# Patient Record
Sex: Male | Born: 1950
Health system: Southern US, Community
[De-identification: ages and names within clinical notes are randomized; demographics above are authoritative.]

## PROBLEM LIST (undated history)

## (undated) DIAGNOSIS — Z9889 Other specified postprocedural states: Secondary | ICD-10-CM

## (undated) DIAGNOSIS — R399 Unspecified symptoms and signs involving the genitourinary system: Secondary | ICD-10-CM

## (undated) DIAGNOSIS — N529 Male erectile dysfunction, unspecified: Secondary | ICD-10-CM

## (undated) DIAGNOSIS — K449 Diaphragmatic hernia without obstruction or gangrene: Secondary | ICD-10-CM

## (undated) DIAGNOSIS — Z8249 Family history of ischemic heart disease and other diseases of the circulatory system: Secondary | ICD-10-CM

## (undated) DIAGNOSIS — Z8719 Personal history of other diseases of the digestive system: Secondary | ICD-10-CM

## (undated) DIAGNOSIS — J4520 Mild intermittent asthma, uncomplicated: Secondary | ICD-10-CM

## (undated) DIAGNOSIS — C4492 Squamous cell carcinoma of skin, unspecified: Secondary | ICD-10-CM

## (undated) DIAGNOSIS — M1990 Unspecified osteoarthritis, unspecified site: Secondary | ICD-10-CM

## (undated) DIAGNOSIS — R339 Retention of urine, unspecified: Secondary | ICD-10-CM

## (undated) DIAGNOSIS — N486 Induration penis plastica: Secondary | ICD-10-CM

## (undated) DIAGNOSIS — Z85828 Personal history of other malignant neoplasm of skin: Secondary | ICD-10-CM

## (undated) DIAGNOSIS — I10 Essential (primary) hypertension: Secondary | ICD-10-CM

## (undated) DIAGNOSIS — A809 Acute poliomyelitis, unspecified: Secondary | ICD-10-CM

## (undated) DIAGNOSIS — L570 Actinic keratosis: Secondary | ICD-10-CM

## (undated) DIAGNOSIS — K219 Gastro-esophageal reflux disease without esophagitis: Secondary | ICD-10-CM

## (undated) DIAGNOSIS — N400 Enlarged prostate without lower urinary tract symptoms: Secondary | ICD-10-CM

## (undated) HISTORY — DX: Squamous cell carcinoma of skin, unspecified: C44.92

## (undated) HISTORY — PX: BAND HEMORRHOIDECTOMY: SHX1213

## (undated) HISTORY — DX: Actinic keratosis: L57.0

## (undated) HISTORY — PX: OTHER SURGICAL HISTORY: SHX169

## (undated) HISTORY — DX: Other specified postprocedural states: Z98.890

## (undated) HISTORY — DX: Diaphragmatic hernia without obstruction or gangrene: K44.9

## (undated) HISTORY — DX: Unspecified osteoarthritis, unspecified site: M19.90

## (undated) HISTORY — DX: Acute poliomyelitis, unspecified: A80.9

---
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
## 1995-04-14 HISTORY — PX: ANKLE FUSION: SHX881

## 1998-03-05 ENCOUNTER — Ambulatory Visit (HOSPITAL_COMMUNITY): Admission: RE | Admit: 1998-03-05 | Discharge: 1998-03-05 | Payer: Self-pay | Admitting: Gastroenterology

## 1998-11-05 ENCOUNTER — Ambulatory Visit (HOSPITAL_COMMUNITY): Admission: RE | Admit: 1998-11-05 | Discharge: 1998-11-05 | Payer: Self-pay | Admitting: Gastroenterology

## 1999-04-02 ENCOUNTER — Ambulatory Visit (HOSPITAL_COMMUNITY): Admission: RE | Admit: 1999-04-02 | Discharge: 1999-04-02 | Payer: Self-pay | Admitting: Gastroenterology

## 1999-04-03 ENCOUNTER — Ambulatory Visit (HOSPITAL_BASED_OUTPATIENT_CLINIC_OR_DEPARTMENT_OTHER): Admission: RE | Admit: 1999-04-03 | Discharge: 1999-04-03 | Payer: Self-pay | Admitting: Orthopedic Surgery

## 1999-07-24 ENCOUNTER — Ambulatory Visit (HOSPITAL_COMMUNITY): Admission: RE | Admit: 1999-07-24 | Discharge: 1999-07-24 | Payer: Self-pay | Admitting: Gastroenterology

## 1999-08-08 ENCOUNTER — Ambulatory Visit (HOSPITAL_COMMUNITY): Admission: RE | Admit: 1999-08-08 | Discharge: 1999-08-08 | Payer: Self-pay | Admitting: Gastroenterology

## 2000-02-24 ENCOUNTER — Ambulatory Visit (HOSPITAL_COMMUNITY): Admission: RE | Admit: 2000-02-24 | Discharge: 2000-02-24 | Payer: Self-pay | Admitting: Gastroenterology

## 2000-07-07 HISTORY — PX: KNEE ARTHROSCOPY: SUR90

## 2000-07-09 ENCOUNTER — Inpatient Hospital Stay (HOSPITAL_COMMUNITY): Admission: EM | Admit: 2000-07-09 | Discharge: 2000-07-11 | Payer: Self-pay | Admitting: Emergency Medicine

## 2000-07-14 ENCOUNTER — Encounter: Admission: RE | Admit: 2000-07-14 | Discharge: 2000-07-14 | Payer: Self-pay | Admitting: Internal Medicine

## 2000-09-07 ENCOUNTER — Ambulatory Visit (HOSPITAL_COMMUNITY): Admission: RE | Admit: 2000-09-07 | Discharge: 2000-09-07 | Payer: Self-pay | Admitting: Gastroenterology

## 2001-03-08 ENCOUNTER — Encounter (INDEPENDENT_AMBULATORY_CARE_PROVIDER_SITE_OTHER): Payer: Self-pay

## 2001-03-08 ENCOUNTER — Ambulatory Visit (HOSPITAL_COMMUNITY): Admission: RE | Admit: 2001-03-08 | Discharge: 2001-03-08 | Payer: Self-pay | Admitting: Gastroenterology

## 2001-03-30 ENCOUNTER — Ambulatory Visit (HOSPITAL_BASED_OUTPATIENT_CLINIC_OR_DEPARTMENT_OTHER): Admission: RE | Admit: 2001-03-30 | Discharge: 2001-03-30 | Payer: Self-pay | Admitting: Orthopedic Surgery

## 2001-03-30 HISTORY — PX: WRIST ARTHROSCOPY WITH DEBRIDEMENT: SHX6194

## 2001-08-11 ENCOUNTER — Ambulatory Visit (HOSPITAL_COMMUNITY): Admission: RE | Admit: 2001-08-11 | Discharge: 2001-08-11 | Payer: Self-pay | Admitting: Gastroenterology

## 2004-04-13 HISTORY — PX: CARDIOVASCULAR STRESS TEST: SHX262

## 2004-06-30 ENCOUNTER — Ambulatory Visit: Payer: Self-pay | Admitting: Cardiology

## 2004-07-10 ENCOUNTER — Ambulatory Visit: Payer: Self-pay | Admitting: Cardiology

## 2004-07-10 ENCOUNTER — Ambulatory Visit: Payer: Self-pay

## 2006-02-22 ENCOUNTER — Inpatient Hospital Stay (HOSPITAL_COMMUNITY): Admission: RE | Admit: 2006-02-22 | Discharge: 2006-02-26 | Payer: Self-pay | Admitting: Orthopedic Surgery

## 2006-02-22 HISTORY — PX: TOTAL KNEE ARTHROPLASTY: SHX125

## 2006-05-31 ENCOUNTER — Ambulatory Visit (HOSPITAL_COMMUNITY): Admission: RE | Admit: 2006-05-31 | Discharge: 2006-05-31 | Payer: Self-pay | Admitting: Orthopedic Surgery

## 2006-06-22 ENCOUNTER — Ambulatory Visit: Payer: Self-pay | Admitting: Family Medicine

## 2006-07-15 ENCOUNTER — Ambulatory Visit: Payer: Self-pay | Admitting: Family Medicine

## 2006-11-01 ENCOUNTER — Ambulatory Visit (HOSPITAL_COMMUNITY): Admission: RE | Admit: 2006-11-01 | Discharge: 2006-11-02 | Payer: Self-pay | Admitting: Orthopedic Surgery

## 2006-11-01 HISTORY — PX: OTHER SURGICAL HISTORY: SHX169

## 2007-02-08 ENCOUNTER — Ambulatory Visit: Payer: Self-pay | Admitting: Internal Medicine

## 2007-02-08 ENCOUNTER — Inpatient Hospital Stay (HOSPITAL_COMMUNITY): Admission: EM | Admit: 2007-02-08 | Discharge: 2007-02-12 | Payer: Self-pay | Admitting: Emergency Medicine

## 2007-02-09 HISTORY — PX: OTHER SURGICAL HISTORY: SHX169

## 2007-02-15 ENCOUNTER — Ambulatory Visit: Payer: Self-pay | Admitting: Family Medicine

## 2007-03-03 ENCOUNTER — Ambulatory Visit: Payer: Self-pay | Admitting: Family Medicine

## 2007-03-11 ENCOUNTER — Encounter: Admission: RE | Admit: 2007-03-11 | Discharge: 2007-03-11 | Payer: Self-pay | Admitting: Family Medicine

## 2007-03-11 ENCOUNTER — Ambulatory Visit: Payer: Self-pay | Admitting: Family Medicine

## 2007-03-17 ENCOUNTER — Encounter: Admission: RE | Admit: 2007-03-17 | Discharge: 2007-03-17 | Payer: Self-pay | Admitting: Family Medicine

## 2007-06-01 ENCOUNTER — Ambulatory Visit: Payer: Self-pay | Admitting: Family Medicine

## 2007-06-08 ENCOUNTER — Ambulatory Visit: Payer: Self-pay | Admitting: Family Medicine

## 2007-09-06 ENCOUNTER — Ambulatory Visit: Payer: Self-pay | Admitting: Family Medicine

## 2008-01-04 ENCOUNTER — Ambulatory Visit (HOSPITAL_BASED_OUTPATIENT_CLINIC_OR_DEPARTMENT_OTHER): Admission: RE | Admit: 2008-01-04 | Discharge: 2008-01-04 | Payer: Self-pay | Admitting: Orthopedic Surgery

## 2008-01-04 HISTORY — PX: NEUROPLASTY / TRANSPOSITION ULNAR NERVE AT ELBOW: SUR895

## 2008-01-05 ENCOUNTER — Ambulatory Visit: Payer: Self-pay | Admitting: Family Medicine

## 2008-01-30 LAB — HM COLONOSCOPY

## 2008-02-07 ENCOUNTER — Ambulatory Visit: Payer: Self-pay | Admitting: Family Medicine

## 2008-02-09 ENCOUNTER — Ambulatory Visit: Payer: Self-pay | Admitting: Family Medicine

## 2008-02-23 ENCOUNTER — Ambulatory Visit: Payer: Self-pay | Admitting: Family Medicine

## 2008-04-03 ENCOUNTER — Ambulatory Visit: Payer: Self-pay | Admitting: Family Medicine

## 2008-06-05 ENCOUNTER — Ambulatory Visit: Payer: Self-pay | Admitting: Family Medicine

## 2008-06-27 ENCOUNTER — Ambulatory Visit: Payer: Self-pay | Admitting: Family Medicine

## 2009-01-28 ENCOUNTER — Ambulatory Visit: Payer: Self-pay | Admitting: Family Medicine

## 2009-03-28 ENCOUNTER — Ambulatory Visit: Payer: Self-pay | Admitting: Family Medicine

## 2009-06-24 ENCOUNTER — Ambulatory Visit: Payer: Self-pay | Admitting: Physician Assistant

## 2009-11-04 ENCOUNTER — Ambulatory Visit: Payer: Self-pay | Admitting: Physician Assistant

## 2010-07-03 ENCOUNTER — Encounter: Payer: Self-pay | Admitting: Family Medicine

## 2010-08-13 ENCOUNTER — Encounter (INDEPENDENT_AMBULATORY_CARE_PROVIDER_SITE_OTHER): Payer: BC Managed Care – PPO | Admitting: Family Medicine

## 2010-08-13 DIAGNOSIS — E871 Hypo-osmolality and hyponatremia: Secondary | ICD-10-CM

## 2010-08-13 DIAGNOSIS — Z0000 Encounter for general adult medical examination without abnormal findings: Secondary | ICD-10-CM

## 2010-08-13 DIAGNOSIS — Z8249 Family history of ischemic heart disease and other diseases of the circulatory system: Secondary | ICD-10-CM

## 2010-08-26 NOTE — Op Note (Signed)
NAME:  AULTON, ROUTT             ACCOUNT NO.:  [id number]   MEDICAL RECORD NO.:  [id number]          PATIENT TYPE:  OIB   LOCATION:  1618                         FACILITY:  [facility]   PHYSICIAN:  Ollen Gross, M.D.    DATE OF BIRTH:  1950/06/08   DATE OF PROCEDURE:  11/01/2006  DATE OF DISCHARGE:                               OPERATIVE REPORT   PREOPERATIVE DIAGNOSIS:  Right periprosthetic patella fracture.   POSTOPERATIVE DIAGNOSIS:  Right periprosthetic patella fracture.   PROCEDURE:  Open reduction internal fixation of right periprosthetic  patella fracture.   SURGEON:  Ollen Gross, M.D.   ASSISTANT:  Alexzandrew L. Perkins, P.A.C.   ANESTHESIA:  General.   BLOOD LOSS:  Minimal.   DRAINS:  None.   COMPLICATIONS:  None.   TOURNIQUET TIME:  42 minutes at 300 mmHg.   CONDITION:  Stable to recovery room.   CLINICAL NOTE:  Wayne Brandt is a 60 year old male who had an uncomplicated  right total knee done last year.  He was doing well and then injured  himself playing tennis.  He felt a pop.  He had a slight separation at  the extensor mechanism; but then, over 2-week time span this worsened.  He presents now for open reduction internal fixation.   PROCEDURE IN DETAIL:  After the successful administration of general  anesthetic, a tourniquet was placed high on the right thigh and the  right lower extremity prepped and draped in the usual sterile fashion.  The extremity was wrapped in Esmarch and the tourniquet inflated to 300  mmHg.  A midline incision was made with a 10 blade through subcutaneous  tissue.  Upon getting through the subcutaneous tissue we encountered  some bloody fluid.  The joint was thoroughly irrigated with saline.  There was a tiny inferior pole fragment and, I would say, 7/8 of the  patella was attached to the superior and 1/8 inferior.  The patellar  component was firmly attached to the superior fragment.  I passed 2  FiberWire sutures through the patella  tendon and the inferior fragment,  with the nail-type stitch; then drilled 3 holes in the patella and  passed the sutures from inferior to superior.  We reduced the patella  with reduction clamp and then tied these sutures together.  This led to  a pretty stable reduction.  I then passed 2 K-wires from inferior to  superior, adding to the reduction; then a Cerclage wire 16-gauge was  passed around, and this effectively reduced the fragments down the rest  of the way.  I let the knee flex against gravity, and at 90 degrees it  was not separating at all at the repair site.  We then copiously  irrigated the wound.  I oversewed some of the soft tissue with  interrupted #1 Ethibond to further enhance the repair.  The tourniquet  was then released, with a total time of 42 minutes.  Minor bleeding was  stopped with cautery.  The subcutaneous tissues were closed with  interrupted 2-  0 Vicryl, and the skin with subcuticular running 4-0 Monocryl.  The  incisions  were cleaned and dried.  Steri-Strips and a bulky sterile  dressing applied.  He was placed into a knee immobilizer, awakened and  transported to recovery in stable condition.      Ollen Gross, M.D.  Electronically Signed     FA/MEDQ  D:  11/01/2006  T:  11/02/2006  Job:  [id number]

## 2010-08-26 NOTE — Op Note (Signed)
NAME:  [name], [name]             ACCOUNT NO.:  [id number]   MEDICAL RECORD NO.:  [id number]          PATIENT TYPE:  INP   LOCATION:  1224                         FACILITY:  [facility]   PHYSICIAN:  Ollen Gross, M.D.    DATE OF BIRTH:  03/11/1951   DATE OF PROCEDURE:  02/09/2007  DATE OF DISCHARGE:                               OPERATIVE REPORT   PREOPERATIVE DIAGNOSIS:  Right knee infection.   POSTOPERATIVE DIAGNOSIS:  Right knee infection.   PROCEDURE:  Irrigation and debridement of the right knee with removal of  patellar wires.   SURGEON:  Ollen Gross, M.D.   ASSISTANT:  Alexzandrew L. Perkins, P.A.-C.   ANESTHESIA:  General.   ESTIMATED BLOOD LOSS:  Minimal.   DRAIN:  Hemovac x1.   TOURNIQUET TIME:  24 minutes at 300 mmHg.   COMPLICATIONS:  None.   BRIEF CLINICAL NOTE:  Wayne Brandt is a 59 year old male who had a right total  knee arthroplasty done without difficulty a little over a year ago.  He  sustained a patella fracture in July of this year.  This was treated  with open reduction and internal fixation.  He recovered uneventfully,  then was exercising this weekend and developed pain and had a small  pinpoint area of drainage in the knee.  The next evening he developed  fever and chills.  He was admitted to the hospital by Dr. Shon Baton for  fear of sepsis.  He responded well to IV vancomycin.  He has had  drainage from this pinpoint area.  He presents now for irrigation and  debridement and removal of patellar hardware.   PROCEDURE IN DETAIL:  After the successful administration of a general  anesthetic, a tourniquet is placed on the right thigh and the right  lower extremity prepped and draped in the usual sterile fashion.  The  extremity was wrapped in an Esmarch, knee flexed, tourniquet inflated to  300 mmHg.  A midline incision was made with a 10 blade through a very  thickened subcutaneous tissue.  I was able to see the small sinus area  that had communicated with  the skin.  There was the end of a wire  directly in this sinus; thus, he was getting irritation from the wire.  I removed the two K-wires, then cut the cerclage wire and removed that  also.  This was all of the patellar hardware.  It appeared this may have  communicated with the joint so I did an arthrotomy.  There was some  fluid in the joint but the tissues, also thickened, did not show what  appeared to be any acute inflammation.  I did a thorough scar excision  and we irrigated the joint with 3 L of saline using pulsatile lavage.  I  did not see any fibrinous debris in the joint, and the joint overall did  not have an infected appearance.  After completion of the lavage, then  we placed a Hemovac drain and closed the arthrotomy with interrupted #1  PDS.  I excised the sinus tract and reapproximated the tissue  that had a healthy appearance.  The tourniquet was then released, total  time of 24 minutes.  Subcu was closed with interrupted 2-0 Vicryl and  skin with staples.  The incision was cleaned and dried and bulky sterile  dressing applied.  He is then awakened and transferred to recovery in  stable condition.      Ollen Gross, M.D.  Electronically Signed     FA/MEDQ  D:  02/09/2007  T:  02/10/2007  Job:  [id number]

## 2010-08-26 NOTE — Op Note (Signed)
NAME:  Wayne Brandt, Wayne Brandt             ACCOUNT NO.:  [id number]   MEDICAL RECORD NO.:  [id number]          PATIENT TYPE:  AMB   LOCATION:  DSC                          FACILITY:  [facility]   PHYSICIAN:  Artist Pais. Weingold, M.D.DATE OF BIRTH:  May 23, 1950   DATE OF PROCEDURE:  01/04/2008  DATE OF DISCHARGE:                               OPERATIVE REPORT   PREOPERATIVE DIAGNOSIS:  Chronic ulnar neuropathy, right elbow with  unstable ulnar nerve.   POSTOPERATIVE DIAGNOSIS:  Chronic ulnar neuropathy, right elbow with  unstable ulnar nerve.   PROCEDURE:  Ulnar nerve decompression and submuscular transposition,  right side.   SURGEONS:  1. Artist Pais Mina Marble, MD  2. Cindee Salt, MD   ANESTHESIA:  Axillary block.   TOURNIQUET TIME:  1 hour.   COMPLICATIONS:  None.   DRAINS:  None of report.   PROCEDURE IN DETAIL:  The patient was taken to the operating suite after  induction of adequate general anesthesia.  Right upper extremity was  prepped and draped in usual sterile fashion.  An Esmarch was used to  exsanguinate the limb and tourniquet was then inflated to 215 mmHg.  At  this point in time, using his previous incision on the medial aspect of  the right elbow, the skin was incised and flaps were raised accordingly.  The ulnar nerve was carefully identified in the proximal cubital tunnel.  There was significant scarring throughout the length of the cubital  tunnel.  This was decompressed proximally and distally to the level of  the fascia overlying the flexor carpi ulnaris muscle which was carefully  decompressed proximally to the area of the arcade of Struthers which was  also decompressed.  The inner muscular septum was resected.  At this  point in time, the flap anteriorly was dissected to expose the median  nerve and the flexor pronator mass.  Once this was done, the flexor  pronator mass was carefully dissected off the medial condyle leaving a  small cuff of tissue.  This was  followed by advancement of the flexor  pronator mass until the ulnar nerve could be transposed in line and  parallel with the median nerve.  Once this was done, the flexor pronator  mass was then repaired back down to the medial epicondyle using a 2-0  FiberWire x2.  Whole of the mattress sutures drilled through bone  tunnels and tied posteriorly.  The remaining aspects of flexor pronator  mass were repaired with 0 Vicryl followed by 2-0 undyed Vicryl  subcutaneously and a 3-0 Prolene  subcuticular stitch on the skin.  Steri-Strips, 4 x 4s, fluffs, and a  splint with the wrist flexed to 90 degrees, and elbow flexed to 90  degrees was applied.  The patient tolerated the procedure well and went  to recovery room in stable fashion.      Artist Pais Mina Marble, M.D.  Electronically Signed     MAW/MEDQ  D:  01/04/2008  T:  01/05/2008  Job:  [id number]

## 2010-08-26 NOTE — H&P (Signed)
NAME:  Wayne Brandt, Wayne Brandt             ACCOUNT NO.:  [id number]   MEDICAL RECORD NO.:  [id number]          PATIENT TYPE:  INP   LOCATION:  1224                         FACILITY:  [facility]   PHYSICIAN:  Alvy Beal, MD    DATE OF BIRTH:  Jun 05, 1950   DATE OF ADMISSION:  02/07/2007  DATE OF DISCHARGE:                              HISTORY & PHYSICAL   His regular caring physician is Dr. Ollen Gross.   HISTORY:  Wayne Brandt is a very pleasant active 60 year old gentleman who about  15 months ago underwent a total right knee replacement.  This was  complicated postoperatively by a patella fracture which he underwent an  open reduction internal fixation in July of 2008.  The patient was doing  quite well, he was very active, exercising on a regular basis and he  stated on Saturday, approximately 3 days ago, he started noting a little  clear drainage from the inferior aspect of his surgical wound.  He  states that he did not have any fevers but he was noting stiffness and  difficulty moving the knee.  It is progressed, and then today he noticed  some fevers and his wife took his temperature and he was at 102.  He  called me by phone and I recommended he come to the ER for further  evaluation.  Upon presentation, he was noted to be hypotensive at 80/44  and tachycardic to 120.  This was sitting in the left arm.   As a result, I was notified.  An attempt was made through a medial  inferior pin site for knee aspiration but this was unsuccessful.  A  local swab was taken from the draining wound site at the inferior aspect  of the anterior knee incision.  The patient was started on IV fluids and  had an excellent response to them.   At present, the patient is in bed, comfortable, he has significant pain  only when he attempts to straight leg raise or bend the knee.  Otherwise, he has no other significant complaints.  He is currently  afebrile and his blood pressure has responded, his last blood  pressure  was 109/59 with a positive response to IV fluids.  After appropriate  enteric blood cultures and aspiration was attempted he was started on IV  vancomycin.   His past medical, surgical, family, social history includes:  1. He has coronary artery disease.  2. He has no other significant medical problems.  3. He is a non-drug abuser.  4. Non-smoker.  5. Occasional drinker.  6. He is otherwise healthy.  7. He has had a previous wrist scope, elbow surgery and leg surgery.   CLINICAL EXAM:  He is a pleasant gentleman who appears his stated age,  in no acute distress.  He is alert and oriented x3.  He has no shortness  of breath or chest pain, no abnormal breath sounds.  He is clear to  auscultation.  Abdomen is soft and nontender.  He has no history of  nausea or vomiting or significant weight loss.  He has no upper  extremity  pain with joint range of motion at the shoulder, elbow, wrist,  no nuchal rigidity.   He has got no pain with internal and external rotation of the lower  extremity at the hip.  His left lower extremity is asymptomatic with no  knee pain, swelling or tenderness, no ankle pain, swelling or  tenderness, intact peripheral pulses.  On the right lower extremity he  has got erythema on the lateral aspect of the knee, he has got a small  pin size drainage site at the inferior aspect of the anterior knee  incision that is draining purulent type material but it is not foul in  odor.  He has got some swelling and tenderness.  He has got significant  pain with attempts at range of motion but no significant pain with  direct palpation.  He is unable to maintain a straight leg position or  because of severe pain and he cannot bend the knee because of severe  pain.   This significantly limits the ability to test his range of motion.   His distal neurovascular exam is intact with no localizing motor or  sensory deficits in the tibialis, anterior, EHL or  gastrocnemius, 2+  symmetrical deep tendon reflexes, sensation to light touch is intact.   X-rays from today were evaluated.  Unfortunately, he have no previous x-  rays to compare but he does appear to have what may be an acute inferior  pole fracture.  The hardware from the ORIF done in July appears to be  stable.  There is no breakage of either the pins or tension band.   At this point in time, I believe the patient has a superficial  cellulitis as well as some prepatellar bursitis.  I did attempt, under  sterile conditions, to aspirate from the knee joint itself.  With an 18-  gauge needle I took a superomedial starting position which was free from  any local erythema.  I was in the joint, I was unable to aspirate  anything but a small amount of blood.  There was no frank pus.  At this  point in time, because of the hypotension and septic appearance, I have  contacted the ICU and will have admitted for ICU observation.  The  critical care team will also be involved in his care.  Will continue his  IV vancomycin, will place a Foley and his labs are still pending.  We  have drawn a CBC with diff and a full BMP panel, and PT, PTT and INR.  Will continue to observe him.  I will notify Dr. Lequita Halt in the morning  and will also get a PICC line and ID consult, if Dr. Lequita Halt feels this  is necessary, in the morning.      Alvy Beal, MD  Electronically Signed     DDB/MEDQ  D:  02/08/2007  T:  02/08/2007  Job:  [id number]   cc:   Ollen Gross, M.D.  Fax: [phone]

## 2010-08-29 NOTE — H&P (Signed)
NAME:  Wayne Brandt, Wayne Brandt             ACCOUNT NO.:  [id number]   MEDICAL RECORD NO.:  [id number]          PATIENT TYPE:  INP   LOCATION:  NA                           [facility]ACILITY:  [facility]   PHYSICIAN:  Ollen Gross, M.D.    DATE OF BIRTH:  [birth year]   DATE OF ADMISSION:  02/22/2006  DATE OF DISCHARGE:                                HISTORY & PHYSICAL   DATE OF OFFICE VISIT AND HISTORY & PHYSICAL:  February 04, 2006.   CHIEF COMPLAINT:  Right knee pain.   HISTORY OF PRESENT ILLNESS:  This is a 60 year old male who has been  complaining of pain in the right knee for quite some time now.  He was seen  by Dr. Lequita Halt in second opinion.  He has a significant history of having  undergone a knee arthroscopy in the past but unfortunately developed  postoperative infection and required a second washout scope.  He did not  require any long-term antibiotics, just postoperatively.  The leg has been  progressive in nature.  He was seen back in December 2006 by Dr. Lequita Halt and  found at that time to have severe end-stage arthritis of the right knee with  massive osteophyte formation.  It has been progressive in the past year and  continues to cause problems and dysfunction.  The patient is extremely  concerned because of the infection he had post arthroscopy several years  ago.  Risks and benefit of this procedure have been discussed with the  patient.  He elects to proceed with surgery.   ALLERGIES:  No known drug allergies.   CURRENT MEDICATIONS:  Aleve stopped preoperatively, vitamin A, vitamin E,  vitamin C, CO-Q-10, acidophilus, and also a cholesterol medication.   PAST MEDICAL HISTORY:  1. History of polio, left leg.  2. Seasonal allergies.  3. Asthma.  4. Esophageal dilatation.   PAST SURGICAL HISTORY:  1. Right shoulder surgery.  2. Left ankle fusion.  3. He did have 2 left foot surgeries during his childhood years, a bunion      on the right foot.  4. Right elbow surgery.  5. Right  wrist surgery.  6. Previous right knee arthroscopy with postoperative infection and then a      second knee arthroscopy washout.   FAMILY HISTORY:  A brother with a history of MI and CABG.  Father with  history of heart disease, deceased at age 23 secondary to MI.   SOCIAL HISTORY:  Married.  Denies tobacco.  Social intake of alcohol.  One  child.  He works as a Print production planner for [employer].   REVIEW OF SYSTEMS:  GENERAL:  No fevers, chills, night sweats.  NEUROLOGIC:  No seizure, syncope, paralysis. RESPIRATORY: No shortness of breath,  productive cough, hemoptysis. CARDIOVASCULAR: No chest pain, angina,  orthopnea. GI: No nausea, vomiting, diarrhea, constipation. GU: No dysuria,  hematuria, discharge.  MUSCULOSKELETAL: Right knee.   PHYSICAL EXAMINATION:  VITAL SIGNS:  Pulse 60, respirations 12, blood  pressure 142/82.  GENERAL: A 60 year old white male, thin frame, alert, oriented, cooperative.  Good historian.  HEENT:  Normocephalic and atraumatic.  Pupils  round and reactive.  Oropharynx clear.  EOM intact.  NECK: Supple.  CHEST: Clear.  HEART: Regular rate and rhythm. No murmur.  S1, S2.  ABDOMEN: Soft, nontender.  Bowel sounds present.  RECTAL/BREASTS/GENITALIA: Not done, not pertinent to present illness.  EXTREMITIES:  Right knee shows range of motion of 10-105, marked crepitus,  no instability.  Some atrophy of the leg is noted.   IMPRESSION:  1. Osteoarthritis, right knee.  2. History of polio, left leg.  3. Seasonal allergies.  4. Asthma.  5. Esophageal strictures post dilatation.   PLAN:  1. The patient will be admitted to [facility] to undergo a right      total knee arthroplasty.  Surgery will      be performed by Dr. Ollen Gross. His medical physician is Dr.      Susann Givens. Dr. Susann Givens will be notified of the room number and will be      consulted if needed for medical assistance with the patient throughout      the hospital course.      Alexzandrew L.  Julien Girt, P.A.      Ollen Gross, M.D.  Electronically Signed    ALP/MEDQ  D:  02/21/2006  T:  02/21/2006  Job:  [id number]   cc:   Sharlot Gowda, M.D.  Fax: [phone]   Griffith Citron, M.D.  Fax: [phone]   Rollene Rotunda, MD, [facility]  [address]  [address]  [place]  [place] [address]

## 2010-08-29 NOTE — Op Note (Signed)
NAME:  Wayne Brandt, Wayne Brandt             ACCOUNT NO.:  [id number]   MEDICAL RECORD NO.:  [id number]          PATIENT TYPE:  INP   LOCATION:  0008                         FACILITY:  [facility]   PHYSICIAN:  Ollen Gross, M.D.    DATE OF BIRTH:  July 15, 1950   DATE OF PROCEDURE:  02/22/2006  DATE OF DISCHARGE:                                 OPERATIVE REPORT   PREOPERATIVE DIAGNOSIS:  Osteoarthritis, right knee.   POSTOPERATIVE DIAGNOSIS:  Osteoarthritis, right knee.   PROCEDURE:  Right total knee arthroplasty.   SURGEON:  Aluisio.   ASSISTANT:  Avel Peace.   ANESTHESIA:  General with postoperative Marcaine pain pump.   ESTIMATED BLOOD LOSS:  Minimal.   DRAINS:  Hemovac x1.   TOURNIQUET TIME:  48 minutes at 300 mmHg.   COMPLICATIONS:  None.   CONDITION:  Stable to recovery.   BRIEF CLINICAL NOTE:  Wayne Brandt is a 60 year old male with severe end-stage  tricompartmental arthritis with large varus deformity.  He has failed  nonoperative management including injection and presents for total knee  arthroplasty.   PROCEDURE IN DETAIL:  After successful initiation of general anesthetic, a  tourniquet was placed on the right thigh and right lower extremity, prepped  and draped in usual sterile fashion.  Extremity was wrapped in Esmarch, knee  flexed, tourniquet inflated to 300 mmHg.  Midline incision was made with a  10 blade through subcutaneous tissue to the level of the extensor mechanism.  Fresh blade was used make a medial parapatellar arthrotomy.  It had massive  osteophytes on the medial femur and medial tibia.  Soft tissue over the  proximal medial tibia was subperiosteally elevated through the joint line  with a knife and into the semimembranosus bursa with a Cobb elevator.  He  had a least 1/2-inch thick osteophyte going all the way around the medial  rim of the tibia, and I removed that.  Patella is then subluxed laterally,  knee flexed 90 degrees.  ACL was already gone.  I had to  remove the  intercondylar osteophytes to get at the PCL.  That was subsequently removed.  A drill was used to create a starting hole in the distal femur.  The canal  was thoroughly irrigated.  A 5-degree right valgus alignment guide was  placed, and referencing off the posterior condyles, rotation was marked and  a block pinned to remove 11 mm off the distal femur.  I took 11 because of  his flexion contracture.  Size 5 was most appropriate femoral component, and  then the size 5 cutting block was placed with the rotation marked off the  epicondylar axis.  The anterior, posterior and chamfer cuts were made.   Tibia subluxed forward, and the meniscal remnants were removed.  The  extramedullary tibial alignment guide was placed referencing proximally at  the medial aspect of tibial tubercle and distally along the second  metatarsal axis and tibial crest.  A block was pinned to remove about 4 mm  off the more deficient lateral side.  Tibial resection was made with an  oscillating saw.  Once resected, remainder  of the marginal osteophytes were  removed medial and lateral.  In addition, we took the marginal osteophytes  off the femur.  Size 5 was the most appropriate tibial component, and the  proximal tibia was prepared with a modular drill and a keel punch for the  size 5.  Femoral preparation is completed with the intercondylar cut.   Size 5 mobile bearing tibial trial, size 5 posterior stabilized femoral  trial, and 10-mm posterior stabilized rotating platform insert trial were  placed.  With a 10, full extension was achieved with excellent varus and  valgus balance throughout full range of motion.  The patella was then  everted and thickness measured to be 24 mm.  Freehand resection was taken to  13 mm.  A 41 template was placed, lug holes were drilled, trial patella was  placed and it tracks normally.  Osteophytes were then removed off the  posterior femur with the trial in place.  All  trials removed and the cut  bone surfaces prepared with pulsatile lavage.  Cement was mixed, and we used  gentamicin-impregnated cement given his previous history of infection.  Cement was mixed, and once ready for implantation, the size 5 mobile bearing  tibial tray, size 5 posterior stabilized femur and 41 patella are cemented  into place and held the clamp.  Trial 10-mm insert was placed, knee held in  full extension, and all extruded cement removed.  Once the cement was fully  hardened, the permanent 10-mm posterior stabilized rotating platform insert  was placed.  While the trial did not hyperextend, it appeared with the  tendon he was slightly hyperextending.  I did not like this, and I  subsequently placed a 12.5-mm posterior stabilized rotating platform insert  which allowed for full extension with excellent varus and valgus balance  throughout full range of motion.  The wound was then copiously irrigated  with saline solution and the extensor mechanism closed over a Hemovac drain  with interrupted #1 PDS.  Flexion against gravity is about 120 degrees.  His  preoperative flexion was only 90.  Subcu was then closed with interrupted 2-  0 Vicryl and tourniquet released with a total time of 48 minutes.  Catheter  for Marcaine pain pump was placed, and the pump was initiated.  Subcuticular  layers were closed with a running 4-0 Monocryl.  Drain was hooked to  suction, incision cleaned and dried, and Steri-Strips and a bulky sterile  dressing applied.  He was placed into a knee immobilizer, awakened and  transferred to recovery in stable condition.      Ollen Gross, M.D.  Electronically Signed     FA/MEDQ  D:  02/22/2006  T:  02/22/2006  Job:  [id number]

## 2010-08-29 NOTE — Discharge Summary (Signed)
Wayne Brandt, Wayne Brandt             ACCOUNT NO.:  [id number]   MEDICAL RECORD NO.:  [id number]          PATIENT TYPE:  INP   LOCATION:  1224                         FACILITY:  [facility]   PHYSICIAN:  Alvy Beal, MD    DATE OF BIRTH:  1950/09/27   DATE OF ADMISSION:  02/07/2007  DATE OF DISCHARGE:  02/12/2007                               DISCHARGE SUMMARY   ADMITTING DIAGNOSES:  1. Infected right knee with cellulitis.  2. Sepsis.  3. Hypotension secondary to number 2.  4. Coronary arterial disease.  5. Status post right total knee.   DISCHARGE DIAGNOSES:  1. Cellulitis infection, right knee status post irrigation debridement      right knee.  2. Hypotension, improved.  3. Hyponatremia, improving.  4. Sepsis resolved.  5. Coronary arterial disease.  6. Status post right total knee.   PROCEDURE:  On February 09, 2007, the patient was taken to OR and  underwent I&D of the right knee with removal of patella wire.  Surgeon  Dr. Lequita Halt, assisted by Greggory Brandy.   CONSULTATIONS:  Medical Critical Care, Dr. Sherene Sires and Group.   BRIEF HISTORY:  Wayne Brandt is a 60 year old male well known to Dr.  Ollen Gross having previously undergone right knee cellulitis, right  knee I&D.  Unfortunately sustained a patella fracture which required  ORIF with patella wire.  The patient had been doing fairly well up until  recently when he developed some redness and drainage around the knee.  He unfortunately got worse and started having some shaking chills, fever  and was brought into the ED on February 07, 2007, and noted to be  hypotensive.  He was admitted by Dr. Shon Baton, who was on call for Dr.  Ollen Gross, given fluids for the hypotension, started on vancomycin  for sepsis and transferred to the ICU for further care.   LABORATORY DATA:  CBC on admission had an elevated white count of 13,  hemoglobin 12.3, hematocrit 34.9.  Serial CBCs followed, white count  went up to 15.3 and came back  down to normal at 5.5, hemoglobin drifted  down to 10 and came back up, it was 10.1 and 28.8.  The admission CBC  showed the differential elevated with neutrophils 92, lymphs low at 3,  monos 5,  eos zero, basos zero.  Differentials were followed for 3 days.  Neutrophils came down to normal level 57.  Sed rate on admission 12.  PT/INR 15.3 and 1.2 preoperatively with PTT of 31.  BMET on admission  showed low sodium of 121, low chloride of 91, glucose elevated at 132.  Serial BMETs were followed.  Sodium came up to 126, last noted at 130,  glucose came down to 106, potassium remained normal.  Chloride came up  to 95, lactic acid 1.3.  B-type natriuretic peptide normal at 44.7.  Urinalysis on admission negative.  Blood cultures taken x 2 on February 07, 2007 no growth after 5 days.  Body fluid culture taken at time of  surgery, STAT gram stain showed organisms.  Urine culture no growth.  Wound culture taken at  time of surgery on February 07, 2007, moderate  WBCs, moderate gram-positive cocci.  Culture proved to be positive for  moderate viridans streptococcus.  A second wound culture taken on  February 08, 2007, no growth.  Anaerobe culture x 2, no anaerobes  isolated.   DIAGNOSTICS:  EKG February 07, 2007, normal sinus rhythm, nonspecific ST  abnormalities, unconfirmed.   HOSPITAL COURSE:  The patient was admitted to the ICU for the above  stated problem.  He was started on fluids to maintain pressure.  He was  also placed on IV vancomycin.  PICC line was ordered.  Dr. Sherene Sires and Dr.  Marchelle Gearing evaluated the patient from IV a critical care medicine  standpoint and felt that he was having sepsis with hyponatremia.  Gave  fluids, checked labs as above, hydrated aggressively, continued the  vancomycin.  On the following day on February 08, 2007, Dr. Lequita Halt came  in and the patient's history was reviewed.  It was felt he had developed  cellulitis and started getting septic from it.  He had  moderate  surrounding cellulitis around the knee.  Therefore, we kept him on the  IV antibiotics another day and also to make sure that he was stable.  His blood pressures had stabilized and his systolic was back up to 108.  It was felt that he would require I&D.  He was set up for that and taken  to the operating room on the next day, February 09, 2007, and underwent  the above stated procedure without complication.  He also had a patella  wire from previous wiring ORIF of the patella that was removed at the  time of surgery.  He is placed on Protonix ordered for stress ulcer  prophylaxis per Dr. Delford Field.  The patient did well and his white count  came down immediately.  His white count was back down to 7.6 by postop  day 1.  We were going to order a PICC line and it had not been placed  yet.  Cultures were negative so far on the morning of day one.  His  sodium was improving.  By February 11, 2007, he was doing much better.  He was back to his normal self. Unfortunately, the cultures did prove  positive for viridans streptococcus.  However, it is  very sensitive.  We discontinued his Foley.  We started on some therapy getting up out of  bed.  Hemoglobin was stable and his white count was down even further at  5.9.  He continued to improve and by February 12, 2007, the patient is  doing well.  Fortunately, the organism was very sensitive to oral  antibiotics and did not require IV antibiotics.  He was doing well from  a postop standpoint.  His hypotension resolved.  Sodium had improved and  he was discharged home February 12, 2007.   DISPOSITION:  The patient discharged home on February 12, 2007.   ASSESSMENT:  1. Cellulitis infection, right knee status post irrigation debridement      right knee.  2. Hypotension, improved.  3. Hyponatremia, improving.  4. Sepsis resolved.  5. Coronary arterial disease.  6. Status post right total knee.   DISCHARGE MEDICATIONS:  1. Percocet.  2.  Robaxin.  3. Placed on Keflex.   DIET:  Heart-healthy diet.   ACTIVITY:  Weightbearing as tolerated.  Limited motion to the knee.   FOLLOW UP:  He will follow back up in the office on Friday for staple  removal and to increase his therapy slowly.   DISPOSITION:  Home.   CONDITION ON DISCHARGE:  Improving.      Alexzandrew L. Perkins, P.A.C.      Alvy Beal, MD  Electronically Signed    ALP/MEDQ  D:  03/31/2007  T:  03/31/2007  Job:  [id number]   cc:   Ollen Gross, M.D.  Fax: [phone]   Charlaine Dalton. Sherene Sires, MD, FCCP  [address]  [place] [address]   Alvy Beal, MD  Fax: [phone]

## 2010-08-29 NOTE — Op Note (Signed)
[facility]  Patient:    Wayne Brandt, Wayne Brandt Visit Number: [id number] MRN: [id number]          Service Type: DSU Location: [facility] Attending Physician:  Marlowe Shores Dictated by:   Artist Pais Mina Marble, M.D. Proc. Date: 03/30/01 Admit Date:  03/30/2001                             Operative Report  PREOPERATIVE DIAGNOSIS: Right wrist pain with metacarpal instability.  POSTOPERATIVE DIAGNOSIS: Right wrist pain with metacarpal instability.  OPERATION/PROCEDURE: Right wrist arthroscopy with debridement of scapholunate and lunotriquetral interosseous ligament tears and cartilaginous osteochondral defects, right wrist.  SURGEON: Artist Pais. Mina Marble, M.D.  ASSISTANT: Junius Roads. Ireton, P.A.C.  ANESTHESIA: Axillary block.  TOURNIQUET TIME: Thirty minutes.  COMPLICATIONS: None.  DRAINS: None.  DESCRIPTION OF PROCEDURE: The patient was taken to the operating room and after induction of adequate axillary block analgesia the right upper extremity was prepped and draped in the usual sterile fashion.  An Esmarch was used to exsanguinate the limb and the tourniquet inflated to 250 mm Hg.  At this point in time the right upper extremity was placed in a Concept wrist traction tower and standard 3-4 arthroscopic portal established.  Once this was done the scope was introduced into the joint bluntly.  Visualization of the joint revealed severe degenerative changes at the lunotriquetral joint, the scapholunate joint.  The radial side ligaments were intact.  The scapholunate interosseous ligament and lunotriquetral ligament were significantly frayed. The TFCC appeared to be intact.  The 4-5 working portal was established under direct vision as well as a 6U outflow portal.  A full radius suction shaver was placed into the joint and joint debridement was undertaken, including a large osteochondral defect on the lunate, which was debrided down  to subchondral bone.  After this was done the lunotriquetral and scapholunate interosseous ligaments were debrided down to stable rims, also using 1.5 mm ArthroCare wand.  A complete synovectomy and joint debridement was carried out on both the radial and ulnar sides.  This was followed by removal of the instruments, injection of Marcaine into the 6U outflow portal after closure of the 4-5 and 3-4 portals with nylon suture.  A sterile dressing of Xeroform, 4 x 4s, fluffs, and volar and dorsal splints were applied.  The patient tolerated the procedure well and went to recovery in stable fashion. Dictated by:   Artist Pais Mina Marble, M.D. Attending Physician:  Marlowe Shores DD:  03/30/01 TD:  03/31/01 Job: [id number]

## 2010-08-29 NOTE — Discharge Summary (Signed)
NAME:  Wayne Brandt, Wayne Brandt             ACCOUNT NO.:  [id number]   MEDICAL RECORD NO.:  [id number]          PATIENT TYPE:  INP   LOCATION:  1505                         FACILITY:  [facility]   PHYSICIAN:  Ollen Gross, M.D.    DATE OF BIRTH:  [birth year]   DATE OF ADMISSION:  02/22/2006  DATE OF DISCHARGE:  02/26/2006                               DISCHARGE SUMMARY   ADMITTING DIAGNOSES:  1. Osteoarthritis, right leg.  2. History of polio, left leg.  3. Seasonal allergies.  4. Asthma.  5. Esophageal strictures with status post dilatation.   DISCHARGE DIAGNOSES:  1. Osteoarthritis, right knee, status post right total knee      arthroplasty.  2. Acute blood loss anemia.  3. Status post transfusion without sequelae.  4. Postoperative hyponatremia, improving.  5. History of polio, left leg.  6. Seasonal allergies.  7. Asthma.  8. Esophageal strictures with status post dilatation.   PROCEDURE:  February 22, 2006.  Surgeon, Dr. Lequita Halt.  Assistant, Wayne Brandt, PAC.  Anesthesia, general.  Postop Marcaine pain pump.   CONSULTS:  None.   BRIEF HISTORY:  Wayne Brandt is a 60 year old male with severe end-stage  intractable arthritis with large varus deformity, failing nonoperative  management, now presents for total knee.   LABORATORY DATA:  Preop CBC showed a hemoglobin of 14.2, hematocrit of  40.7, white cell count 4.8.  Postop H and H's were followed.  Postop  hemoglobin 10.3, drifting down eventually to 7.7 given blood, hemoglobin  back up to 9.7 and 27.5.  PT and PTT preop showed a pro time of 13 with  an INR of 1.0 and PTT of 26.  Serial pro times followed.  The last known  PT and INR 23.3 and 2.  Chem panel on admission:  Low sodium of 134.  Elevated of AST of 38.  Remaining chem panel on admission all within  normal limits.  Serial BMETs were followed.  Sodium did drop from 134 to  128 back up to 129.  Preop UA trace hemoglobin, small leukocyte esterase  with a 0-2 white cells.  Blood  group type O-positive.   EKG, February 15, 2006, sinus bradycardia, left ventricular hypertrophy.  ST and T wave changes consistent with strain pattern and early  repolarization, cannot exclude ischemia.  No significant change when  compared to July 09, 2000, confirmed by Dr. Dietrich Pates.  Two-view  chest, February 15, 2006, no active disease, slight prominence of the  hilus thought to be secondary to slight rotational.  There may be some  mild scoliosis.  Followup chest film in 2-3 months, however, for further  evaluation of the left hilus.  Pelvis film, February 25, 2006, no  complications, status post right total hip arthroplasty.  Postop hip and  pelvis, February 25, 2006, no complications, status post right total  hip.   HOSPITAL COURSE:  The patient admitted to [facility], tolerated  procedure well, later transferred to recovery room, orthopedic floor,  started on PCA and p.o. analgesia for pain control following surgery.  Had some pain through the night but doing pretty well on  day #1, was  already doing straight-leg raises on day #1, bleed a little bit through  the dressing with some bloody drainage.  Dressing was changed.  On day  #1, incision looked good.  Marcaine pain pump was left in place but  Hemovac drain was pulled, had excellent urine output.  He started  getting up with therapy.  By day #2, the patient was doing well, did  have some increased pain because of the amount of activity and mobility.  Hemoglobin was down to 8.7 still with some bloody drainage from Hemovac  site but started to progress with physical therapy.  By day #3 had been  seen in rounds.  Hemoglobin dropped to 7.7.  He was running a little low-  grade temp.  Encouraged incentive spirometer.  Recommended blood.  Started on iron supplements and was transfused 2 units of blood.  By the  following day of day #4, hemoglobin was back up to 9.7.  The patient  felt much better, more energy, better rested,  incision looked good, was  tolerating meds, and was ready to go home.   DISCHARGE PLAN:  1. The patient was discharged home on February 26, 2006.  2. Discharge diagnoses, please see above.  3. Discharge meds:  Coumadin, Percocet, Robaxin.   DIET:  As tolerated.   FOLLOWUP:  Two weeks.   ACTIVITY:  Weight bear as tolerated.  Home health PT.  Home health  nursing.   DISPOSITION:  Home.   CONDITION UPON DISCHARGE:  Improved.      Alexzandrew L. Julien Girt, P.A.      Ollen Gross, M.D.  Electronically Signed    ALP/MEDQ  D:  03/25/2006  T:  03/25/2006  Job:  [id number]   cc:   Ollen Gross, M.D.  Fax: [phone]   Sharlot Gowda, M.D.  Fax: [phone]   Griffith Citron, M.D.  Fax: [phone]   Rollene Rotunda, MD, [facility]  [address]  [address]  [place]  [place] [address]

## 2010-08-29 NOTE — Procedures (Signed)
[facility]  Patient:    LAZER, [name]                      MRN: [id number] Proc. Date: 08/08/99 Adm. Date:  [id number] Attending:  Deneen Harts                           Procedure Report  PROCEDURE:  Esophageal dilation.  INDICATIONS:  A 60 year old white male with recurrent distal esophageal stricture. Seen two weeks ago with acute meat impaction, dilated at that time to 48-French  [facility].  Typically, the patient returns for elective esophageal dilation approximately every 5-6 months.  Usually dilated to a [place] 56-French at that  time.  No interim difficulty over the past two weeks.  PROCEDURE:  After reviewing the nature of the procedure with the patient, including potential risks and complications and after discussing alternative methods of treatment, informed consent was signed.  Patient was premedicated with topical anesthetic.  No IV sedation per patient request.  Esophageal Maloney dilators were sequentially passed beginning with a 46, then 8, 50, 52, 54, and 56-French dilators.  With several very minimal popping resistance was appreciated at 45 cm.  There was no heme staining, no chest pain.  The patient tolerated the procedure without immediate complication and without discomfort.  ASSESSMENT: 1. Recurrent distal esophageal stricture. 2. Successful Maloney dilation 56-French.  RECOMMENDATION: 1. Repeat esophageal dilation 5-6 months. 2. Patient to consider if he would like to learn self-dilation to avoid the need    for recurrent endoscopy suite visits to have this performed.  We will discuss    this further upon patients return. DD:  08/08/99 TD:  08/10/99 Job: [id number]

## 2010-08-29 NOTE — Procedures (Signed)
[facility]  Patient:    Wayne Brandt, Wayne Brandt                    MRN: [id number] Proc. Date: 07/09/00 Adm. Date:  [id number] Attending:  Randolm Idol                           Procedure Report  PREOPERATIVE DIAGNOSIS:  Hemarthrosis/pyarthrosis, right knee.  POSTOPERATIVE DIAGNOSIS:  Hemarthrosis/pyarthrosis, right knee.  PROCEDURE:  Arthroscopic lavage and debridement of right knee.  ANESTHESIA:  General orotracheal.  COMPLICATIONS:  None.  INDICATIONS:  This 60 year old gentleman is 48 hours status post right knee arthroscopy.  He developed increasing pain in his knee yesterday.  He was seen in the office by Dr. Farris Has. His knee was aspirated with cell count at 24,000. The gram stain was negative for any organisms. His wife called this morning and said that he was having increasing pain.  He was evaluated in the emergency room.  70 cc of blood-tinged fluid was removed.  There were abundant WBCs in the gram stain, and his white cell count had risen to over 26,000. With a history of pyarthrosis, he is now to have an arthroscopic debridement.  PROCEDURE:  With the patient comfortable on the operating table and under general orotracheal anesthesia, the right lower extremity was placed in a thigh holder. The leg was then prepped with Duraprep and the thigh holder to the ankle.  Sterile draping was performed.  The medial and lateral arthroscopic portals were utilized.  They were reopened.  The medial portal was somewhat reddened, but there was no obvious drainage.  The third portal was established in the mediosuperior pouch for continuous irrigation.  The arthroscope was delivered through the lateral portal.  The joint was explored.  There was abundant reddened synovium and clot.  The effluent was sent for aerobic and anaerobic culture.  The medial portal was opened and used for debridement using the [employer] shaver. There was considerable  chondromalacia of the medial compartment with a few areas of exposed bone.  It appears as though he has had at least a partial medial meniscectomy.  The lateral compartment revealed chondromalacia but to a much lesser extent than it did medially.  There was also considerable chondromalacia of the patella.  There was an abundant amount of synovial tissue, and this was debrided.  I irrigated with over 9,000 cc of fluid.  At that point, I could extend the knee, where as preoperatively I could not.  The three stab wounds were left open.  A Hemovac was inserted.  The reservoir was then charged.  The soft bulk of dressing was applied, followed by a knee immobilizer.  PLAN:  Infectious disease evaluation.  Will start Ancef and admit for a day or two for IV antibiotics. DD:  07/09/00 TD:  07/10/00 Job: [id number]

## 2010-09-09 ENCOUNTER — Telehealth: Payer: Self-pay | Admitting: Family Medicine

## 2010-09-09 ENCOUNTER — Other Ambulatory Visit: Payer: Self-pay

## 2010-09-09 MED ORDER — DIFLUNISAL 500 MG PO TABS: 500.0000 mg | ORAL_TABLET | Freq: Two times a day (BID) | ORAL | Status: DC

## 2010-09-09 NOTE — Telephone Encounter (Signed)
If he keeps having trouble, he needs to see his dentist

## 2010-09-09 NOTE — Telephone Encounter (Signed)
Pt informed

## 2010-12-12 ENCOUNTER — Encounter: Payer: Self-pay | Admitting: Family Medicine

## 2011-01-12 LAB — POCT HEMOGLOBIN-HEMACUE: Hemoglobin: 13.8

## 2011-01-20 LAB — BASIC METABOLIC PANEL
CO2: 28
Chloride: 95 — ABNORMAL LOW
Creatinine, Ser: 0.74
GFR calc Af Amer: >60
Sodium: 130 — ABNORMAL LOW

## 2011-01-20 LAB — CBC
HCT: 28.8 — ABNORMAL LOW
Hemoglobin: 10.1 — ABNORMAL LOW
MCHC: 35.1
MCV: 90
RBC: 3.2 — ABNORMAL LOW

## 2011-01-21 LAB — URINALYSIS, ROUTINE W REFLEX MICROSCOPIC
Ketones, ur: NEGATIVE
Nitrite: NEGATIVE
Protein, ur: NEGATIVE
Specific Gravity, Urine: 1.015

## 2011-01-21 LAB — BASIC METABOLIC PANEL
BUN: 14
BUN: 4 — ABNORMAL LOW
CO2: 24
CO2: 27
Chloride: 91 — ABNORMAL LOW
Chloride: 93 — ABNORMAL LOW
Creatinine, Ser: 0.84
GFR calc Af Amer: >60
GFR calc Af Amer: >60
GFR calc Af Amer: >60
GFR calc non Af Amer: >60
GFR calc non Af Amer: >60
Glucose, Bld: 101 — ABNORMAL HIGH
Glucose, Bld: 129 — ABNORMAL HIGH
Potassium: 3.8
Potassium: 3.9
Potassium: 4
Sodium: 126 — ABNORMAL LOW
Sodium: 129 — ABNORMAL LOW
Sodium: 132 — ABNORMAL LOW

## 2011-01-21 LAB — DIFFERENTIAL
Basophils Absolute: 0
Basophils Absolute: 0
Basophils Absolute: 0
Basophils Relative: 0
Basophils Relative: 0
Basophils Relative: 1
Eosinophils Absolute: 0
Eosinophils Absolute: 0
Eosinophils Absolute: 0.1
Eosinophils Relative: 0
Eosinophils Relative: 0
Lymphs Abs: 0.3 — ABNORMAL LOW
Monocytes Absolute: 0.8 — ABNORMAL HIGH
Monocytes Absolute: 1.2 — ABNORMAL HIGH
Monocytes Relative: 11
Monocytes Relative: 5
Neutro Abs: 3.4
Neutrophils Relative %: 57
Neutrophils Relative %: 92 — ABNORMAL HIGH

## 2011-01-21 LAB — CBC
HCT: 30.8 — ABNORMAL LOW
HCT: 34.9 — ABNORMAL LOW
Hemoglobin: 10 — ABNORMAL LOW
Hemoglobin: 10.9 — ABNORMAL LOW
MCHC: 34.7
MCHC: 34.9
MCHC: 35.4
MCV: 89
MCV: 89.1
MCV: 90
Platelets: 180
Platelets: 184
RBC: 3.21 — ABNORMAL LOW
RBC: 3.92 — ABNORMAL LOW
RDW: 14.2 — ABNORMAL HIGH
RDW: 14.7 — ABNORMAL HIGH
RDW: 15 — ABNORMAL HIGH
WBC: 13 — ABNORMAL HIGH

## 2011-01-21 LAB — CULTURE, BLOOD (ROUTINE X 2): Culture: NO GROWTH

## 2011-01-21 LAB — ANAEROBIC CULTURE

## 2011-01-21 LAB — WOUND CULTURE: Culture: NO GROWTH

## 2011-01-21 LAB — HEPATIC FUNCTION PANEL
ALT: 17
Bilirubin, Direct: 0.1
Indirect Bilirubin: 0.7
Total Protein: 5.5 — ABNORMAL LOW

## 2011-01-21 LAB — PROTIME-INR
INR: 1.2
Prothrombin Time: 15.3 — ABNORMAL HIGH

## 2011-01-21 LAB — MAGNESIUM: Magnesium: 1.9

## 2011-01-21 LAB — LACTIC ACID, PLASMA: Lactic Acid, Venous: 1.3

## 2011-01-21 LAB — URINE CULTURE: Culture: NO GROWTH

## 2011-01-21 LAB — BODY FLUID CULTURE

## 2011-01-26 LAB — HEMOGLOBIN AND HEMATOCRIT, BLOOD
HCT: 38.9 — ABNORMAL LOW
Hemoglobin: 13.7

## 2011-01-26 LAB — PROTIME-INR: INR: 1

## 2011-05-26 ENCOUNTER — Telehealth: Payer: Self-pay | Admitting: Family Medicine

## 2011-05-26 MED ORDER — DIFLUNISAL 500 MG PO TABS: 500.0000 mg | ORAL_TABLET | Freq: Two times a day (BID) | ORAL | Status: DC | PRN

## 2011-05-26 NOTE — Telephone Encounter (Signed)
Chart reviewed.  Last filled #60 5/29.  Refilled.  Has appt sched for May.

## 2011-05-26 NOTE — Telephone Encounter (Signed)
Pt called and wants refill on Dolbid for jaw popping.  [employer]

## 2011-06-15 ENCOUNTER — Encounter: Payer: Self-pay | Admitting: Internal Medicine

## 2011-07-22 ENCOUNTER — Encounter: Payer: Self-pay | Admitting: Dermatology

## 2011-08-20 ENCOUNTER — Other Ambulatory Visit (INDEPENDENT_AMBULATORY_CARE_PROVIDER_SITE_OTHER): Payer: BC Managed Care – PPO

## 2011-08-20 DIAGNOSIS — Z0000 Encounter for general adult medical examination without abnormal findings: Secondary | ICD-10-CM

## 2011-08-20 LAB — POC HEMOCCULT BLD/STL (HOME/3-CARD/SCREEN): Card #3 Fecal Occult Blood, POC: NEGATIVE

## 2011-09-02 ENCOUNTER — Ambulatory Visit (INDEPENDENT_AMBULATORY_CARE_PROVIDER_SITE_OTHER): Payer: BC Managed Care – PPO | Admitting: Family Medicine

## 2011-09-02 ENCOUNTER — Encounter: Payer: Self-pay | Admitting: Family Medicine

## 2011-09-02 VITALS — BP 128/80 | HR 64 | Ht 69.24 in | Wt 163.0 lb

## 2011-09-02 DIAGNOSIS — Z125 Encounter for screening for malignant neoplasm of prostate: Secondary | ICD-10-CM

## 2011-09-02 DIAGNOSIS — Z79899 Other long term (current) drug therapy: Secondary | ICD-10-CM

## 2011-09-02 DIAGNOSIS — Z8249 Family history of ischemic heart disease and other diseases of the circulatory system: Secondary | ICD-10-CM

## 2011-09-02 DIAGNOSIS — E871 Hypo-osmolality and hyponatremia: Secondary | ICD-10-CM

## 2011-09-02 DIAGNOSIS — R319 Hematuria, unspecified: Secondary | ICD-10-CM

## 2011-09-02 DIAGNOSIS — Z0000 Encounter for general adult medical examination without abnormal findings: Secondary | ICD-10-CM

## 2011-09-02 DIAGNOSIS — R195 Other fecal abnormalities: Secondary | ICD-10-CM

## 2011-09-02 LAB — POCT URINALYSIS DIPSTICK
Glucose, UA: NEGATIVE
Spec Grav, UA: <=1.005
Urobilinogen, UA: NEGATIVE

## 2011-09-02 NOTE — Patient Instructions (Addendum)
HEALTH MAINTENANCE RECOMMENDATIONS:  It is recommended that you get at least 30 minutes of aerobic exercise at least 5 days/week (for weight loss, you may need as much as 60-90 minutes). This can be any activity that gets your heart rate up. This can be divided in 10-15 minute intervals if needed, but try and build up your endurance at least once a week.  Weight bearing exercise is also recommended twice weekly.  Eat a healthy diet with lots of vegetables, fruits and fiber.  "Colorful" foods have a lot of vitamins (ie green vegetables, tomatoes, red peppers, etc).  Limit sweet tea, regular sodas and alcoholic beverages, all of which has a lot of calories and sugar.  Up to 2 alcoholic drinks daily may be beneficial for men (unless trying to lose weight, watch sugars).  Drink a lot of water.  Sunscreen of at least SPF 30 should be used on all sun-exposed parts of the skin when outside between the hours of 10 am and 4 pm (not just when at beach or pool, but even with exercise, golf, tennis, and yard work!)  Use a sunscreen that says "broad spectrum" so it covers both UVA and UVB rays, and make sure to reapply every 1-2 hours.  Remember to change the batteries in your smoke detectors when changing your clock times in the spring and fall.  Use your seat belt every time you are in a car, and please drive safely and not be distracted with cell phones and texting while driving.  Please check into insurance coverage for Zostavax (shingles vaccine).  If interested, schedule nurse visit to get the shot.  Blood in the urine was more than previous years.  We are referring you back to [facility] (where you saw Dr. Aldean Ast) for further discussion regarding work-up.  There was also hemoccult + stool (blood in stool).  But your 3 cards from 2 weeks ago were negative, so likely just related to a hemorrhoid.  You can mention this to Dr. Kinnie Scales at your follow up. Next colonoscopy is due 2014

## 2011-09-02 NOTE — Progress Notes (Signed)
Wayne Brandt is a 61 y.o. male who presents for a complete physical.  He has no specific concerns.  Records reviewed--saw Dr. Aldean Ast in 07/2006 for hematuria.  Did renal u/s and eval for a particular protein in urine that would be abnormal for people with bladder cancer.  Cytology not checked, no cystoscopy done.  He had negative blood in urine in years following, or trace, but today has large blood in urine.  Denies any dysuria, or other urinary symptoms.  Health Maintenance: Immunization History  Administered Date(s) Administered  . Influenza Whole 01/05/2008  . Pneumococcal Polysaccharide 06/01/2007  . Td 06/20/1997  . Tdap 01/05/2008  gets flu shots yearly at work Zostavax was recommended last year, but he never looked into insurance coverage and isn't particularly interested. Last colonoscopy: 2009 Last PSA: 08/2010 Dentist: twice yearly Ophtho: yearly Exercise: daily (elliptical, bike), 30-60 mins  Past Medical History  Diagnosis Date  . HH (hiatus hernia)   . Esophageal stricture     Dr. Kinnie Scales  . Actinic keratosis     Dr. Danella Deis  . Arthritis     wrist, L thumb  . Benign hematuria   . Hyponatremia     127-134 range  . Mitral valve prolapse     ruled out per patient by echo  . Polio 1956  . BCC (basal cell carcinoma of skin) 05/2011    chest    Past Surgical History  Procedure Date  . Total knee arthroplasty 11/07    RIGHT (Aluisio)  . Ankle fusion 1997    LEFT  . Wrist surgery     right (Dr. Mina Marble)  . Ulnar nerve surgery     Right (Dr. Mina Marble)  . Cardiovascular stress test 2006    normal--Dr. Antoine Poche  . Esophagogastroduodenoscopy 2009    Dr. Kinnie Scales  . Colonoscopy 2009    due again 2014  . Esophageal dilation 07/2010, 03/2011  . Band hemorrhoidectomy     Dr. Kinnie Scales    History   Social History  . Marital Status: Married    Spouse Name: N/A    Number of Children: 1  . Years of Education: N/A   Occupational History  . service [employer]    Social History Main Topics  . Smoking status: Never Smoker   . Smokeless tobacco: Never Used  . Alcohol Use: Yes     couple beers after working out, daily  . Drug Use: No  . Sexually Active: Not on file   Other Topics Concern  . Not on file   Social History Narrative   Lives with wife, 1 cat.  Son from a previous marriage lives in [place]    Family History  Problem Relation Age of Onset  . Alcohol abuse Mother   . Cancer Mother     male cancer in 9's  . Heart disease Father 43    MI  . Heart disease Brother 35    MI  . Diabetes Neg Hx   . Heart disease Brother 48    stents    Current outpatient prescriptions:aspirin 81 MG tablet, Take 81 mg by mouth daily.  , Disp: , Rfl: ;  calcium-vitamin D (OSCAL WITH D) 500-200 MG-UNIT per tablet, Take 1 tablet by mouth daily. , Disp: , Rfl: ;  Multiple Vitamins-Minerals (MULTIVITAMIN WITH MINERALS) tablet, Take 1 tablet by mouth daily.  , Disp: , Rfl: ;  diflunisal (DOLOBID) 500 MG TABS, Take 1 tablet (500 mg total) by mouth 2 (two) times daily  as needed., Disp: 60 tablet, Rfl: 0 fish oil-omega-3 fatty acids 1000 MG capsule, Take 2 g by mouth daily.  , Disp: , Rfl: ;  glucosamine-chondroitin 500-400 MG tablet, Take 1 tablet by mouth 3 (three) times daily.  , Disp: , Rfl: ;  Pyridoxine HCl (VITAMIN B-6) 500 MG tablet, Take 500 mg by mouth daily.  , Disp: , Rfl:   No Known Allergies  ROS:  The patient denies anorexia, fever, weight changes, headaches,  vision loss, decreased hearing, ear pain, hoarseness, chest pain, palpitations, dizziness, syncope, dyspnea on exertion, cough, swelling, nausea, vomiting, diarrhea, constipation, abdominal pain, melena, hematochezia, indigestion/heartburn, hematuria, incontinence, erectile dysfunction, nocturia, weakened urine stream, dysuria, genital lesions, weakness, tremor, suspicious skin lesions, depression, anxiety, abnormal bleeding/bruising, or enlarged lymph nodes +occasional jaw pain if eats hard  foods H/o microscopic hematuria--no visible blood per pt No rectal bleeding since getting hemorrhoidal banding Occasional tingling R hand from ulnar nerve issue (chronic) Occasional L thumb pain if uses screwdriver a lot, occasional R wrist pain. Some urinary frequency related to caffeine/alcohol intake  PHYSICAL EXAM: BP 128/80  Pulse 64  Ht 5' 9.24" (1.759 m)  Wt 163 lb (73.936 kg)  BMI 23.90 kg/m2  General Appearance:    Alert, cooperative, no distress, appears stated age  Head:    Normocephalic, without obvious abnormality, atraumatic  Eyes:    PERRL, conjunctiva/corneas clear, EOM's intact, fundi    benign  Ears:    Normal TM's and external ear canals  Nose:   Nares normal, mucosa normal, no drainage or sinus   tenderness  Throat:   Lips, mucosa, and tongue normal; teeth and gums normal  Neck:   Supple, no lymphadenopathy;  thyroid:  no   enlargement/tenderness/nodules; no carotid   bruit or JVD  Back:    Spine nontender, no curvature, ROM normal, no CVA     tenderness  Lungs:     Clear to auscultation bilaterally without wheezes, rales or     ronchi; respirations unlabored  Chest Wall:    No tenderness or deformity   Heart:    Regular rate and rhythm, S1 and S2 normal, no murmur, rub   or gallop  Breast Exam:    No chest wall tenderness, masses or gynecomastia  Abdomen:     Soft, non-tender, nondistended, normoactive bowel sounds,    no masses, no hepatosplenomegaly  Genitalia:    Normal male external genitalia without lesions.  Testicles without masses.  No inguinal hernias.  Rectal:    Normal sphincter tone, no masses or tenderness; guaiac positive stool.  Prostate smooth, no nodules, not enlarged.  Extremities:   No clubbing, cyanosis or edema. +leg length discrepancy--left is shorter than right leg (since L ankle fusion per pt)  Pulses:   2+ and symmetric all extremities  Skin:   Skin color, texture, turgor normal, no rashes or lesions.  Actinic damage, no specific  lesions (sees derm regularly)  Lymph nodes:   Cervical, supraclavicular, and axillary nodes normal  Neurologic:   CNII-XII intact, normal strength, sensation and gait; reflexes 2+ and symmetric throughout          Psych:   Normal mood, affect, hygiene and grooming.    ASSESSMENT/PLAN: 1. Physical exam, annual  Visual acuity screening, POCT Urinalysis Dipstick  2. Special screening for malignant neoplasm of prostate  PSA  3. Hyponatremia  Comprehensive metabolic panel  4. Family history of heart disease in male family member before age 60  Lipid panel  5.  Encounter for long-term (current) use of other medications  CBC with Differential  6. Hematuria     refer back to urology for re-eval  7. Heme + stool     he recently submitted hemoccult kit from last year, and all three specimens were negative for occult blood (2 wks ago); therefore, likely benign   Hematuria--refer back to Dr. Aldean Ast to f/u.  ? If further evaluation needed.  Much larger blood noted today compared to previous years.  Previous notes looks like f/u had been recommended.  Hemoccult+--just sent in 3 negative cards 2 weeks ago, so likely benign/related to hemorrhoid.  If has anemia on labs, will refer for additional evaluation.  Discussed PSA screening (risks/benefits), recommended at least 30 minutes of aerobic activity at least 5 days/week; proper sunscreen use reviewed; healthy diet and alcohol recommendations (less than or equal to 2 drinks/day) reviewed; regular seatbelt use; changing batteries in smoke detectors. Self-testicular exams. Immunization recommendations discussed.  Colonoscopy recommendations reviewed--UTD. Zostavax recommended.  Risks/benefits reviewed.  Pt to check insurance and schedule nurse visit if interested.

## 2011-09-03 ENCOUNTER — Encounter: Payer: Self-pay | Admitting: Family Medicine

## 2011-09-03 LAB — CBC WITH DIFFERENTIAL/PLATELET
Basophils Absolute: 0 10*3/uL (ref 0.0–0.1)
Basophils Relative: 0 % (ref 0–1)
Eosinophils Relative: 5 % (ref 0–5)
HCT: 41.1 % (ref 39.0–52.0)
MCH: 31.6 pg (ref 26.0–34.0)
MCHC: 33.6 g/dL (ref 30.0–36.0)
MCV: 94.1 fL (ref 78.0–100.0)
Monocytes Absolute: 0.9 10*3/uL (ref 0.1–1.0)
RDW: 13.9 % (ref 11.5–15.5)

## 2011-09-03 LAB — LIPID PANEL
HDL: 63 mg/dL (ref >39)
LDL Cholesterol: 97 mg/dL (ref 0–99)
Triglycerides: 55 mg/dL (ref <150)
VLDL: 11 mg/dL (ref 0–40)

## 2011-09-03 LAB — COMPREHENSIVE METABOLIC PANEL
ALT: 18 U/L (ref 0–53)
AST: 28 U/L (ref 0–37)
Alkaline Phosphatase: 103 U/L (ref 39–117)
Chloride: 95 mEq/L — ABNORMAL LOW (ref 96–112)
Creat: 0.73 mg/dL (ref 0.50–1.35)
Total Bilirubin: 0.5 mg/dL (ref 0.3–1.2)

## 2011-09-09 ENCOUNTER — Telehealth: Payer: Self-pay | Admitting: Internal Medicine

## 2011-09-09 NOTE — Telephone Encounter (Signed)
I called [facility] to set up pt with them for hematuria with Dr. Aldean Ast and had to fax over office notes,labs, insurance and referral form. appt has not yet been set up.

## 2011-12-03 ENCOUNTER — Encounter: Payer: Self-pay | Admitting: Family Medicine

## 2011-12-03 DIAGNOSIS — R3129 Other microscopic hematuria: Secondary | ICD-10-CM | POA: Insufficient documentation

## 2012-01-25 ENCOUNTER — Encounter: Payer: Self-pay | Admitting: Family Medicine

## 2012-02-25 ENCOUNTER — Ambulatory Visit (INDEPENDENT_AMBULATORY_CARE_PROVIDER_SITE_OTHER): Payer: BC Managed Care – PPO | Admitting: Family Medicine

## 2012-02-25 ENCOUNTER — Encounter: Payer: Self-pay | Admitting: Family Medicine

## 2012-02-25 VITALS — BP 110/68 | HR 64 | Ht 69.25 in | Wt 162.0 lb

## 2012-02-25 DIAGNOSIS — R05 Cough: Secondary | ICD-10-CM

## 2012-02-25 DIAGNOSIS — Z23 Encounter for immunization: Secondary | ICD-10-CM

## 2012-02-25 DIAGNOSIS — J309 Allergic rhinitis, unspecified: Secondary | ICD-10-CM

## 2012-02-25 DIAGNOSIS — R059 Cough, unspecified: Secondary | ICD-10-CM

## 2012-02-25 DIAGNOSIS — J45909 Unspecified asthma, uncomplicated: Secondary | ICD-10-CM

## 2012-02-25 MED ORDER — ALBUTEROL SULFATE HFA 108 (90 BASE) MCG/ACT IN AERS: 2.0000 | INHALATION_SPRAY | Freq: Four times a day (QID) | RESPIRATORY_TRACT | Status: DC | PRN

## 2012-02-25 NOTE — Progress Notes (Signed)
Chief Complaint  Patient presents with  . Cough    x at least a month and a half. Would also like shingles vaccine.   Complains of cough for 1.5-2 months.  Feels congested in L ear, some postnasal drainage.  No significant runny nose, occasional sneeze.  No sinus pain.  Occasionally is able to get some phlegm up--in the mornings it looks dark (yellow/green/brown), but throughout the day doesn't really get much phlegm up.    Cough is worse with talking. Also worse with eating (during the meal, not afterwards, food irritating the throat).  Denies any heartburn.  Coughed a lot during indoor tennis last night.  Denies chest tightness.  Describes having "asthma" at night, like when he was a child.  He tends to get this way in the fall with the weather change, cooler weather.  Used an old Publishing rights manager inhaler last week with good results. Used an OTC allergy pill, just once, ?if helped.  Hasn't used any other OTC meds or cough meds.  Denies fevers, chills--overall feels great, good energy, exercises daily.    Past Medical History  Diagnosis Date  . HH (hiatus hernia)   . Esophageal stricture     Dr. Kinnie Scales  . Actinic keratosis     Dr. Danella Deis  . Arthritis     wrist, L thumb  . Benign hematuria   . Hyponatremia     127-134 range  . Mitral valve prolapse     ruled out per patient by echo  . Polio 1956  . BCC (basal cell carcinoma of skin) 05/2011    chest  . Microscopic hematuria 11/2011    negative w/u per Dr. Margarita Grizzle (CT, cystoscopy)   Past Surgical History  Procedure Date  . Total knee arthroplasty 11/07    RIGHT (Aluisio)  . Ankle fusion 1997    LEFT  . Wrist surgery     right (Dr. Mina Marble)  . Ulnar nerve surgery     Right (Dr. Mina Marble)  . Cardiovascular stress test 2006    normal--Dr. Antoine Poche  . Esophagogastroduodenoscopy 2009    Dr. Kinnie Scales  . Colonoscopy 2009    due again 2014  . Esophageal dilation 07/2010, 03/2011  . Band hemorrhoidectomy     Dr. Kinnie Scales  .  Cystoscopy 11/2011    normal; Dr. Margarita Grizzle   History   Social History  . Marital Status: Married    Spouse Name: N/A    Number of Children: 1  . Years of Education: N/A   Occupational History  . service [employer]   Social History Main Topics  . Smoking status: Never Smoker   . Smokeless tobacco: Never Used  . Alcohol Use: Yes     Comment: couple beers after working out, daily  . Drug Use: No  . Sexually Active: Not on file   Other Topics Concern  . Not on file   Social History Narrative   Lives with wife, 1 cat.  Son from a previous marriage lives in [place]   ROS:  Denies fevers, nausea, vomiting, diarrhea, heartburn, skin rash.  See HPI  PHYSICAL EXAM: BP 110/68  Pulse 64  Ht 5' 9.25" (1.759 m)  Wt 162 lb (73.483 kg)  BMI 23.75 kg/m2 Well developed male, sounds congested, in no distress.  Mild dry cough only noted during exam, after forced expiration. No coughing otherwise. HEENT:  PERRL, EOMI, conjunctiva clear.  TM's and EAC's normal. Nasal mucosa mild-mod edematous, no purulence, no erythema. Sinuses nontender.  OP  clear Neck: no lymphadenopathy or mass Heart: regular rate and rhythm without murmur Lungs: clear with good air movement.  Dry hacky cough with forced expiration.  No wheezes, rales, ronchi Extremities: no edema Skin: no rash  ASSESSMENT/PLAN: 1. Cough  albuterol (PROVENTIL HFA;VENTOLIN HFA) 108 (90 BASE) MCG/ACT inhaler  2. Allergic rhinitis, cause unspecified    3. Reactive airway disease  albuterol (PROVENTIL HFA;VENTOLIN HFA) 108 (90 BASE) MCG/ACT inhaler  4. Need for shingles vaccine  Varicella-zoster vaccine subcutaneous   Cough--suspect underlying allergies, with postnasal drip and reactive airways.  Cannot exclude component of reflux.  Albuterol MDI as needed for dry cough/wheezing Use an allergy medication (claritin, zyrtec) daily. Use Mucinex (either plain or DM) twice daily. I also recommend taking prilosec daily (OTC)--if your cough  Improves,  stop this medication first, and if cough recurs, then you know you need to continue it.    F/u as scheduled with Dr. Kinnie Scales   counseled regarding risks/benefits of zostavax.  F/u 2-3 weeks if not better

## 2012-02-25 NOTE — Patient Instructions (Signed)
Albuterol MDI as needed for dry cough/wheezing Use an allergy medication (claritin, zyrtec). Use Mucinex (either plain or DM) twice daily. I also recommend taking prilosec daily (OTC)--if your cough  Improves, stop this medication first, and if cough recurs, then you know you need to continue it.    2-3 weeks if not better

## 2012-03-07 ENCOUNTER — Telehealth: Payer: Self-pay | Admitting: Family Medicine

## 2012-03-07 MED ORDER — AZITHROMYCIN 250 MG PO TABS: ORAL_TABLET | ORAL | Status: DC

## 2012-03-07 NOTE — Telephone Encounter (Signed)
Patient informed that Zpak was called into pharmacy.

## 2012-03-07 NOTE — Telephone Encounter (Signed)
Advise pt--z-pak sent to pharmacy

## 2012-03-07 NOTE — Telephone Encounter (Signed)
Pt called back and said lots of congestion in throat, no color that he can tell, no fever.  Inhaler is only helping the asthma symptoms not the cough.  Please call in antibiotic and he will let us know if no better in  2 -3 weeks.

## 2012-03-07 NOTE — Telephone Encounter (Signed)
Is he having any discolored mucus/phlegm? Fevers? Did albuterol inhaler help? I actually recommended OV if not better in 2-3 weeks (not just call).  But if he is having these symptoms, I can call in ABX.  I might want to consider steroids, but prefer to re-eval before calling that in (also to explain risks/side effects)

## 2013-02-01 ENCOUNTER — Other Ambulatory Visit (INDEPENDENT_AMBULATORY_CARE_PROVIDER_SITE_OTHER): Payer: BC Managed Care – PPO

## 2013-02-01 DIAGNOSIS — Z23 Encounter for immunization: Secondary | ICD-10-CM

## 2013-02-28 ENCOUNTER — Ambulatory Visit (INDEPENDENT_AMBULATORY_CARE_PROVIDER_SITE_OTHER): Payer: BC Managed Care – PPO | Admitting: Medical

## 2013-02-28 ENCOUNTER — Encounter: Payer: Self-pay | Admitting: Medical

## 2013-02-28 VITALS — BP 130/80 | HR 68 | Temp 98.0°F | Resp 16 | Wt 170.0 lb

## 2013-02-28 DIAGNOSIS — R05 Cough: Secondary | ICD-10-CM

## 2013-02-28 DIAGNOSIS — R059 Cough, unspecified: Secondary | ICD-10-CM

## 2013-02-28 DIAGNOSIS — J45909 Unspecified asthma, uncomplicated: Secondary | ICD-10-CM

## 2013-02-28 MED ORDER — ALBUTEROL SULFATE HFA 108 (90 BASE) MCG/ACT IN AERS: 2.0000 | INHALATION_SPRAY | Freq: Four times a day (QID) | RESPIRATORY_TRACT | Status: DC | PRN

## 2013-02-28 NOTE — Progress Notes (Signed)
Subjective:  Wayne Brandt is a 62 y.o. male who presents for cough x 11mo.  Gets similar cough like this every fall.  He reports a history of childhood asthma, but hasn't had a lot of problems with this as an adult.  Every fall particularly when it gets cold, he notes random cough spells, worse after exercise, worse after playing tennis, worse after being out in cold weather area.  He denies wheezing or shortness of breath or chest pain. Denies acid reflux problems. Doesn't get a lot of allergy symptoms other than some postnasal drip and tickle in the throat.  Last year was prescribed albuterol which seemed to work pretty good. Denies any current medications for this.  He is a nonsmoker.  No sick contacts, and he doesn't feel sick.  No other aggravating or relieving factors.  No other c/o.  The following portions of the patient's history were reviewed and updated as appropriate: allergies, current medications, past family history, past medical history, past social history, past surgical history and problem list.  ROS as in subjective  Past Medical History  Diagnosis Date  . HH (hiatus hernia)   . Esophageal stricture     Dr. Kinnie Scales  . Actinic keratosis     Dr. Danella Deis  . Arthritis     wrist, L thumb  . Benign hematuria   . Hyponatremia     127-134 range  . Mitral valve prolapse     ruled out per patient by echo  . Polio 1956  . BCC (basal cell carcinoma of skin) 05/2011    chest  . Microscopic hematuria 11/2011    negative w/u per Dr. Margarita Grizzle (CT, cystoscopy)     Objective: BP 130/80  Pulse 68  Temp(Src) 98 F (36.7 C) (Oral)  Resp 16  Wt 170 lb (77.111 kg)   General appearance: Alert, WD/WN, no distress                             Skin: warm, no rash, no diaphoresis                           Head: no sinus tenderness                            Eyes: conjunctiva normal, corneas clear, PERRLA                            Ears: pearly TMs, external ear canals normal                 Nose: septum midline, turbinates mildly swollen, with no discharge             Mouth/throat: MMM, tongue normal, no pharyngeal erythema                           Neck: supple, no adenopathy, no thyromegaly, nontender                          Heart: RRR, normal S1, S2, no murmurs                         Lungs: clear, no rhonchi, no wheezes, no rales  Extremities: no edema, nontender     Assessment: Encounter Diagnoses  Name Primary?  . Reactive airway disease Yes  . Cough     Plan:  I reviewed his office notes from a year ago when he came in for exactly the same.  Similar to a year ago, it seems that he is having reactive airway disease, likely triggers: Cold weather and allergens.  Advise he use albuterol 1-2 puffs prior to exercise or being out in cold weather. Use albuterol otherwise when necessary for cough, shortness of breath or wheezing, 1-2 puffs every 6 hours.  We will set him up to a baseline PFT. If not improving or worse in the next 2 weeks then return.  If he is having to use the inhaler other than as a preventative measure before exercise then return.

## 2013-03-03 ENCOUNTER — Other Ambulatory Visit: Payer: Self-pay | Admitting: Family Medicine

## 2013-03-03 DIAGNOSIS — J45909 Unspecified asthma, uncomplicated: Secondary | ICD-10-CM

## 2013-03-07 ENCOUNTER — Encounter: Payer: Self-pay | Admitting: Family Medicine

## 2013-03-07 ENCOUNTER — Ambulatory Visit (INDEPENDENT_AMBULATORY_CARE_PROVIDER_SITE_OTHER): Payer: BC Managed Care – PPO | Admitting: Family Medicine

## 2013-03-07 VITALS — BP 150/100 | HR 66 | Temp 97.7°F | Wt 167.0 lb

## 2013-03-07 DIAGNOSIS — J019 Acute sinusitis, unspecified: Secondary | ICD-10-CM

## 2013-03-07 MED ORDER — HYDROCOD POLST-CHLORPHEN POLST 10-8 MG/5ML PO LQCR: 5.0000 mL | Freq: Two times a day (BID) | ORAL | Status: DC | PRN

## 2013-03-07 MED ORDER — AZITHROMYCIN 500 MG PO TABS: 500.0000 mg | ORAL_TABLET | Freq: Every day | ORAL | Status: DC

## 2013-03-07 NOTE — Progress Notes (Signed)
  Subjective:    Patient ID: GRYPHON VANDERVEEN, male    DOB: 30-Jan-1951, 62 y.o.   MRN: [id number]  HPI He has a two day history of PND, sore throat,sinus pressure,headach,no earache, worsening cough,malaise and fatigue. He does not smoke.   Review of Systems     Objective:   Physical Exam alert and in no distress. Tympanic membranes and canals are normal. Throat is clear. Tonsils are normal. Neck is supple without adenopathy or thyromegaly. Cardiac exam shows a regular sinus rhythm without murmurs or gallops. Lungs are clear to auscultation. Nasal mucosa slightly red with tenderness especially over frontal and ethmoid sinuses       Assessment & Plan:  Acute sinusitis - Plan: azithromycin (ZITHROMAX) 500 MG tablet, chlorpheniramine-HYDROcodone (TUSSIONEX PENNKINETIC ER) 10-8 MG/5ML LQCR  he probably also has slight bronchitis in the azithromycin should help with that. He is to call in 7-10 days if no improvement.

## 2013-03-07 NOTE — Patient Instructions (Signed)
Afrin nasal spray for congestion only at night. Tylenol Advil or Aleve. You can actually mixed Tylenol with Advil or Aleve.

## 2013-03-14 ENCOUNTER — Telehealth: Payer: Self-pay | Admitting: Family Medicine

## 2013-03-14 DIAGNOSIS — J019 Acute sinusitis, unspecified: Secondary | ICD-10-CM

## 2013-03-14 MED ORDER — AZITHROMYCIN 500 MG PO TABS: 500.0000 mg | ORAL_TABLET | Freq: Every day | ORAL | Status: DC

## 2013-03-14 NOTE — Telephone Encounter (Signed)
He states he is roughly 50% better. I will call in another round of azithromycin. Recommend he use Afrin only at night.

## 2013-03-14 NOTE — Telephone Encounter (Signed)
Pt called and stated that he is not a lot better. He was to call you back if that was the case.

## 2013-03-24 ENCOUNTER — Ambulatory Visit: Payer: BC Managed Care – PPO | Admitting: Family Medicine

## 2013-04-03 ENCOUNTER — Ambulatory Visit (INDEPENDENT_AMBULATORY_CARE_PROVIDER_SITE_OTHER): Payer: BC Managed Care – PPO | Admitting: Internal Medicine

## 2013-04-03 DIAGNOSIS — J45909 Unspecified asthma, uncomplicated: Secondary | ICD-10-CM

## 2013-04-03 LAB — PULMONARY FUNCTION TEST
DL/VA: 4.36 ml/min/mmHg/L
DLCO unc % pred: 100 %
FEF 25-75 Post: 1.57 L/sec
FEF 25-75 Pre: 1.81 L/sec
FEF2575-%Change-Post: -13 %
FEF2575-%Pred-Post: 59 %
FEV1-%Pred-Post: 88 %
FEV1-%Pred-Pre: 88 %
FEV1-Pre: 2.91 L
FEV1FVC-%Pred-Pre: 91 %
FEV6-Post: 3.98 L
FEV6FVC-%Change-Post: 0 %
FVC-%Change-Post: -2 %
FVC-%Pred-Pre: 95 %
Post FEV1/FVC ratio: 71 %
Pre FEV1/FVC ratio: 69 %
Pre FEV6/FVC Ratio: 98 %
RV % pred: 133 %

## 2013-04-03 NOTE — Progress Notes (Signed)
PFT done today. 

## 2013-04-05 NOTE — Progress Notes (Signed)
Quick Note:  PATIENT WAS INFORMED WORD FOR WORD AND VERBALIZED UNDERSTANDING WITH NO QUESTIONS AT THIS TIME ______

## 2013-06-14 ENCOUNTER — Ambulatory Visit: Payer: BC Managed Care – PPO | Admitting: Family Medicine

## 2013-06-15 ENCOUNTER — Encounter: Payer: Self-pay | Admitting: Family Medicine

## 2013-06-15 ENCOUNTER — Ambulatory Visit (INDEPENDENT_AMBULATORY_CARE_PROVIDER_SITE_OTHER): Payer: BC Managed Care – PPO | Admitting: Family Medicine

## 2013-06-15 VITALS — BP 130/80 | HR 60 | Temp 97.6°F | Wt 172.0 lb

## 2013-06-15 DIAGNOSIS — J209 Acute bronchitis, unspecified: Secondary | ICD-10-CM

## 2013-06-15 MED ORDER — AZITHROMYCIN 500 MG PO TABS: 500.0000 mg | ORAL_TABLET | Freq: Every day | ORAL | Status: DC

## 2013-06-15 NOTE — Progress Notes (Signed)
   Subjective:    Patient ID: Wayne Brandt, male    DOB: Jun 24, 1950, 63 y.o.   MRN: [id number]  HPI He has a one-week history of sore throat and nasal congestion followed several days later by cough that is productive. He also is having difficulty with headache. Symptoms are not improving.   Review of Systems     Objective:   Physical Exam  alert and in no distress. Tympanic membranes and canals are normal. Throat is clear. Tonsils are normal. Neck is supple without adenopathy or thyromegaly. Cardiac exam shows a regular sinus rhythm without murmurs or gallops. Lungs are clear to auscultation.       Assessment & Plan:  Acute bronchitis - Plan: azithromycin (ZITHROMAX) 500 MG tablet  he is to call to not entirely better in about one week.

## 2013-06-26 ENCOUNTER — Encounter: Payer: Self-pay | Admitting: Family Medicine

## 2013-06-26 ENCOUNTER — Ambulatory Visit (INDEPENDENT_AMBULATORY_CARE_PROVIDER_SITE_OTHER): Payer: BC Managed Care – PPO | Admitting: Family Medicine

## 2013-06-26 VITALS — BP 112/70 | HR 60 | Ht 69.0 in | Wt 169.0 lb

## 2013-06-26 DIAGNOSIS — E871 Hypo-osmolality and hyponatremia: Secondary | ICD-10-CM | POA: Insufficient documentation

## 2013-06-26 DIAGNOSIS — R3129 Other microscopic hematuria: Secondary | ICD-10-CM

## 2013-06-26 DIAGNOSIS — N486 Induration penis plastica: Secondary | ICD-10-CM | POA: Insufficient documentation

## 2013-06-26 DIAGNOSIS — Z8249 Family history of ischemic heart disease and other diseases of the circulatory system: Secondary | ICD-10-CM

## 2013-06-26 DIAGNOSIS — J309 Allergic rhinitis, unspecified: Secondary | ICD-10-CM

## 2013-06-26 DIAGNOSIS — Z125 Encounter for screening for malignant neoplasm of prostate: Secondary | ICD-10-CM

## 2013-06-26 DIAGNOSIS — Z0000 Encounter for general adult medical examination without abnormal findings: Secondary | ICD-10-CM

## 2013-06-26 LAB — POCT URINALYSIS DIPSTICK
Bilirubin, UA: NEGATIVE
Glucose, UA: NEGATIVE
KETONES UA: NEGATIVE
Nitrite, UA: NEGATIVE
Protein, UA: NEGATIVE
UROBILINOGEN UA: NEGATIVE
pH, UA: 8

## 2013-06-26 NOTE — Progress Notes (Signed)
Chief Complaint  Patient presents with  . Annual Exam    fasting annual exam. UA showed trace leuks and 1+ blood-pt is asymptomatic. Does state that he has lingering cough that is year round, worse in the fall. Did not do eye exam as he had one with Dr.Byrnes 3 weeks ago,    Wayne Brandt is a 63 y.o. male who presents for a complete physical.  He has the following concerns:  Bronchitis follow-up:  Seen by Dr. Redmond School within the last two weeks, treated with z-pak.  He is much better, but has some mild residual cough, congestion and PND. Seems better when he leaves the house.  He has cats.  He states he had PFT's a few months ago, and that he didn't do any better after puffing the inhaler. He hasn't been bothered by his asthma, not using albuterol.  Peyronie's disease--recent urology notes reviewed, which detailed potential treatment options.  None recommended at this time.  Immunization History  Administered Date(s) Administered  . Influenza Whole 01/05/2008  . Influenza,inj,Quad PF,36+ Mos 02/01/2013  . Pneumococcal Polysaccharide-23 06/01/2007  . Td 06/20/1997  . Tdap 01/05/2008  . Zoster 02/25/2012   Last colonoscopy: 2009, was due in 2014.  They called him, but he hasn't set it up yet.  Recalls having polyps in the past (not aware of which type). Last PSA: 08/2011 Dentist: twice yearly  Ophtho: yearly Exercise: daily (elliptical, bike), 30-60 mins.  Weights at least 1-2x/week  Past Medical History  Diagnosis Date  . HH (hiatus hernia)   . Esophageal stricture     Dr. Earlean Shawl  . Actinic keratosis     Dr. Tonia Brooms  . Arthritis     wrists, L thumb  . Benign hematuria   . Hyponatremia     127-134 range  . Mitral valve prolapse     ruled out per patient by echo  . Polio 1956  . BCC (basal cell carcinoma of skin) 05/2011    chest  . Microscopic hematuria 11/2011    negative w/u per Dr. Jasmine December (CT, cystoscopy)  . Asthma     since childhood   Past Surgical History   Procedure Laterality Date  . Total knee arthroplasty  11/07    RIGHT (Aluisio)  . Ankle fusion  1997    LEFT  . Wrist surgery      right (Dr. Burney Gauze)  . Ulnar nerve surgery      Right (Dr. Burney Gauze)  . Cardiovascular stress test  2006    normal--Dr. Percival Spanish  . Esophagogastroduodenoscopy  2009    Dr. Earlean Shawl  . Colonoscopy  2009    due again 2014  . Esophageal dilation  07/2010, 03/2011  . Band hemorrhoidectomy      Dr. Earlean Shawl  . Cystoscopy  11/2011    normal; Dr. Jasmine December   History   Social History  . Marital Status: Married    Spouse Name: N/A    Number of Children: 1  . Years of Education: N/A   Occupational History  . 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 Smoking status: Never Smoker   . Smokeless tobacco: Never Used  . Alcohol Use: Yes     Comment: couple beers after working out, daily  . Drug Use: No  . Sexual Activity: Not Currently   Other Topics Concern  . Not on file   Social History Narrative   Lives with wife, 1 cat.  Son from  a previous marriage lives in [place]   Family History  Problem Relation Age of Onset  . Alcohol abuse Mother   . Cancer Mother     male cancer in 81's  . Heart disease Father 58    MI  . Heart disease Brother 77    MI  . Diabetes Neg Hx   . Colon cancer Neg Hx   . Heart disease Brother 48    stents   Outpatient Encounter Prescriptions as of 06/26/2013  Medication Sig Note  . co-enzyme Q-10 30 MG capsule Take 30 mg by mouth daily.   . fish oil-omega-3 fatty acids 1000 MG capsule Take 1 g by mouth daily.    . Pyridoxine HCl (VITAMIN B-6) 500 MG tablet Take 500 mg by mouth daily.     . vitamin B-12 (CYANOCOBALAMIN) 1000 MCG tablet Take 1,000 mcg by mouth daily.   . vitamin C (ASCORBIC ACID) 500 MG tablet Take 500 mg by mouth daily.   Marland Kitchen albuterol (PROVENTIL HFA;VENTOLIN HFA) 108 (90 BASE) MCG/ACT inhaler Inhale 2 puffs into the lungs every 6 (six) hours as needed for wheezing.  06/26/2013: Takes as needed for wheezing--hasn't needed in a long time  . [DISCONTINUED] azithromycin (ZITHROMAX) 500 MG tablet Take 1 tablet (500 mg total) by mouth daily.   . [DISCONTINUED] calcium carbonate (OS-CAL) 600 MG TABS Take 600 mg by mouth daily.   . [DISCONTINUED] chlorpheniramine-HYDROcodone (TUSSIONEX PENNKINETIC ER) 10-8 MG/5ML LQCR Take 5 mLs by mouth every 12 (twelve) hours as needed for cough.   . [DISCONTINUED] vitamin E 200 UNIT capsule Take 200 Units by mouth daily.    No Known Allergies  ROS: The patient denies anorexia, fever, weight changes, headaches, vision loss, decreased hearing, ear pain, hoarseness, chest pain, palpitations, dizziness, syncope, dyspnea on exertion, cough, swelling, nausea, vomiting, diarrhea, constipation, abdominal pain, melena, hematochezia, indigestion/heartburn, hematuria, incontinence, erectile dysfunction, nocturia, weakened urine stream, dysuria, weakness, tremor, suspicious skin lesions, depression, anxiety, abnormal bleeding/bruising, or enlarged lymph nodes  +occasional jaw pain if eats hard foods--avoids it and hasn't had pain. H/o microscopic hematuria--no visible blood per pt  No rectal bleeding since getting hemorrhoidal banding  Occasional tingling R hand from ulnar nerve issue (chronic)--not lately, doing better.  Thinks the B6 he takes helps.  Weakness resolved. Occasional L thumb pain if uses screwdriver a lot (known arthritis), occasional bilateral wrist pain.  Some head congestion/PND/cough, especially in the mornings. Some intermittent dysphagia, flares periodically.  He needs to drink a lot of water when he eats.   Voids frequently due to caffeine and fluid intake. Up twice at night to void (2 beers in the evening). Not in a sexual relationship due to wife's health issues.  PHYSICAL EXAM:  BP 112/70  Pulse 60  Ht 5\' 9"  (1.753 m)  Wt 169 lb (76.658 kg)  BMI 24.95 kg/m2  General Appearance:  Alert, cooperative, no distress,  appears stated age   Head:  Normocephalic, without obvious abnormality, atraumatic   Eyes:  PERRL, conjunctiva/corneas clear, EOM's intact, fundi  benign   Ears:  Normal TM's and external ear canals   Nose:  Nares normal, no sinus tenderness; Mod edemaof nasal mucosa, pale, clear mucus, some bloody mucus on the right.   Throat:  Lips, mucosa, and tongue normal; teeth and gums normal. some cobblestoning posteriorly  Neck:  Supple, no lymphadenopathy; thyroid: no enlargement/tenderness/nodules; no carotid  bruit or JVD   Back:  Spine nontender, no curvature, ROM normal, no CVA tenderness  Lungs:  Clear to auscultation bilaterally without wheezes, rales or ronchi; respirations unlabored   Chest Wall:  No tenderness or deformity   Heart:  Regular rate and rhythm, S1 and S2 normal, no murmur, rub  or gallop   Breast Exam:  No chest wall tenderness, masses or gynecomastia   Abdomen:  Soft, non-tender, nondistended, normoactive bowel sounds,  no masses, no hepatosplenomegaly   Genitalia:  Normal male external genitalia without lesions. There is curvature of the distal portion of the penis. Testicles without masses. No inguinal hernias.   Rectal:  Normal sphincter tone, no masses or tenderness; guaiac negative stool. Prostate smooth, no nodules, not enlarged.   Extremities:  No clubbing, cyanosis or edema. +leg length discrepancy--left is shorter than right leg (since L ankle fusion per pt). L ankle fused; prominent tendon anteriorly  Pulses:  2+ and symmetric all extremities (DP not palpable on left; extremities is warm, normal PT)  Skin:  Skin color, texture, turgor normal, no rashes or lesions. Actinic damage, no specific lesions (sees derm regularly)   Lymph nodes:  Cervical, supraclavicular, and axillary nodes normal   Neurologic:  CNII-XII intact, normal strength, sensation and gait; reflexes 2+ and symmetric throughout   Psych: Normal mood, affect, hygiene and grooming.    ASSESSMENT/PLAN:  Routine general medical examination at a health care facility - Plan: POCT Urinalysis Dipstick, Lipid panel, Comprehensive metabolic panel, TSH, PSA  Microscopic hematuria - stable, unchanged  Hyponatremia - chronic, due for routine monitoring - Plan: Comprehensive metabolic panel, TSH  Special screening for malignant neoplasm of prostate - Plan: PSA  Family history of early CAD - Plan: Lipid panel  Allergic rhinitis, cause unspecified  Peyronie disease - recent urology visit; opted not to treat at this time   Congestion/PND/cough--possible allergies.  Recommended antihistamine (claritin or zyrtec) and mucinex prn. Hyponatremia--asymptomatic. Due for screening Family h/o CAD--desires repeat of lipid panel (great last year)  Discussed PSA screening (risks/benefits), recommended at least 30 minutes of aerobic activity at least 5 days/week; proper sunscreen use reviewed; healthy diet and alcohol recommendations (less than or equal to 2 drinks/day) reviewed; regular seatbelt use; changing batteries in smoke detectors. Self-testicular exams. Immunization recommendations discussed--to get Prevnar 67 at age 67.  Continue yearly flu shots. Colonoscopy recommendations reviewed--past due per records from Dr. Earlean Shawl.  Pt will contact his office and schedule.

## 2013-06-26 NOTE — Patient Instructions (Signed)
  HEALTH MAINTENANCE RECOMMENDATIONS:  It is recommended that you get at least 30 minutes of aerobic exercise at least 5 days/week (for weight loss, you may need as much as 60-90 minutes). This can be any activity that gets your heart rate up. This can be divided in 10-15 minute intervals if needed, but try and build up your endurance at least once a week.  Weight bearing exercise is also recommended twice weekly.  Eat a healthy diet with lots of vegetables, fruits and fiber.  "Colorful" foods have a lot of vitamins (ie green vegetables, tomatoes, red peppers, etc).  Limit sweet tea, regular sodas and alcoholic beverages, all of which has a lot of calories and sugar.  Up to 2 alcoholic drinks daily may be beneficial for men (unless trying to lose weight, watch sugars).  Drink a lot of water.  Sunscreen of at least SPF 30 should be used on all sun-exposed parts of the skin when outside between the hours of 10 am and 4 pm (not just when at beach or pool, but even with exercise, golf, tennis, and yard work!)  Use a sunscreen that says "broad spectrum" so it covers both UVA and UVB rays, and make sure to reapply every 1-2 hours.  Remember to change the batteries in your smoke detectors when changing your clock times in the spring and fall.  Use your seat belt every time you are in a car, and please drive safely and not be distracted with cell phones and texting while driving.   Schedule your colonoscopy with Dr. Earlean Shawl.  Use claritin or Zyrtec for allergies, congestion, and mucinex (guaifenesin) as needed to thin out the mucus that is draining down the throat, which will help with your cough.

## 2013-06-27 LAB — PSA: PSA: 1.43 ng/mL (ref <=4.00)

## 2013-06-27 LAB — COMPREHENSIVE METABOLIC PANEL
ALBUMIN: 4.2 g/dL (ref 3.5–5.2)
ALT: 18 U/L (ref 0–53)
AST: 30 U/L (ref 0–37)
Alkaline Phosphatase: 94 U/L (ref 39–117)
BILIRUBIN TOTAL: 0.5 mg/dL (ref 0.2–1.2)
BUN: 9 mg/dL (ref 6–23)
CO2: 29 mEq/L (ref 19–32)
CREATININE: 0.7 mg/dL (ref 0.50–1.35)
Calcium: 9.5 mg/dL (ref 8.4–10.5)
Chloride: 94 mEq/L — ABNORMAL LOW (ref 96–112)
GLUCOSE: 86 mg/dL (ref 70–99)
POTASSIUM: 4.7 meq/L (ref 3.5–5.3)
Sodium: 130 mEq/L — ABNORMAL LOW (ref 135–145)
Total Protein: 7.2 g/dL (ref 6.0–8.3)

## 2013-06-27 LAB — LIPID PANEL
CHOL/HDL RATIO: 2.8 ratio
Cholesterol: 201 mg/dL — ABNORMAL HIGH (ref 0–200)
HDL: 73 mg/dL (ref >39)
LDL Cholesterol: 116 mg/dL — ABNORMAL HIGH (ref 0–99)
TRIGLYCERIDES: 59 mg/dL (ref <150)
VLDL: 12 mg/dL (ref 0–40)

## 2013-06-27 LAB — TSH: TSH: 2.901 u[IU]/mL (ref 0.350–4.500)

## 2013-07-05 ENCOUNTER — Encounter: Payer: Self-pay | Admitting: Family Medicine

## 2014-02-15 HISTORY — PX: COLONOSCOPY, ESOPHAGOGASTRODUODENOSCOPY (EGD) AND ESOPHAGEAL DILATION: SHX5781

## 2014-02-15 LAB — HM COLONOSCOPY

## 2014-02-22 ENCOUNTER — Encounter: Payer: Self-pay | Admitting: *Deleted

## 2014-02-22 ENCOUNTER — Other Ambulatory Visit: Payer: Self-pay | Admitting: *Deleted

## 2014-02-27 ENCOUNTER — Encounter: Payer: Self-pay | Admitting: Family Medicine

## 2014-02-28 ENCOUNTER — Encounter: Payer: Self-pay | Admitting: Internal Medicine

## 2014-03-02 ENCOUNTER — Encounter: Payer: Self-pay | Admitting: Family Medicine

## 2014-05-03 ENCOUNTER — Encounter: Payer: Self-pay | Admitting: Family Medicine

## 2014-05-03 ENCOUNTER — Ambulatory Visit (INDEPENDENT_AMBULATORY_CARE_PROVIDER_SITE_OTHER): Payer: BLUE CROSS/BLUE SHIELD | Admitting: Family Medicine

## 2014-05-03 VITALS — BP 120/70 | HR 56 | Temp 100.0°F | Ht 69.0 in | Wt 173.0 lb

## 2014-05-03 DIAGNOSIS — J069 Acute upper respiratory infection, unspecified: Secondary | ICD-10-CM

## 2014-05-03 DIAGNOSIS — R059 Cough, unspecified: Secondary | ICD-10-CM

## 2014-05-03 DIAGNOSIS — R509 Fever, unspecified: Secondary | ICD-10-CM

## 2014-05-03 DIAGNOSIS — R05 Cough: Secondary | ICD-10-CM

## 2014-05-03 LAB — POC INFLUENZA A&B (BINAX/QUICKVUE)
INFLUENZA B, POC: NEGATIVE
Influenza A, POC: NEGATIVE

## 2014-05-03 MED ORDER — HYDROCODONE-HOMATROPINE 5-1.5 MG/5ML PO SYRP: 5.0000 mL | ORAL_SOLUTION | Freq: Three times a day (TID) | ORAL | Status: DC | PRN

## 2014-05-03 NOTE — Progress Notes (Signed)
Chief Complaint  Patient presents with  . Cough    lots of coughing, could hardly sleep last night. Has 100.2 yesterday and 99.6 this am. Just doesn't feel good. Not getting up much mucus. Would like flu shot today if okay. Also requesting some hydrocodone cough syrup to help sleep.    2 days ago he started with scratchy throat, yesterday he felt chilled, feverish (100.2).  Denies head congestion.  "eyeballs ache".  He is coughing, worse when lying down, had trouble sleeping at night.  Not coughing up any phlegm.  Yesterday his joints felt achey, relieved by Aleve.  He feels weak, tired, head pressure/headache.  Denies shortness of breath, hasn't needed inhaler. Denies sick contacts.  Hasn't had a flu shot yet this year.  PMH, PSH, SH reviewed.  Outpatient Encounter Prescriptions as of 05/03/2014  Medication Sig Note  . fish oil-omega-3 fatty acids 1000 MG capsule Take 1 g by mouth daily.    . Pyridoxine HCl (VITAMIN B-6) 500 MG tablet Take 500 mg by mouth daily.     . vitamin B-12 (CYANOCOBALAMIN) 1000 MCG tablet Take 1,000 mcg by mouth daily.   . vitamin C (ASCORBIC ACID) 500 MG tablet Take 500 mg by mouth daily.   Marland Kitchen albuterol (PROVENTIL HFA;VENTOLIN HFA) 108 (90 BASE) MCG/ACT inhaler Inhale 2 puffs into the lungs every 6 (six) hours as needed for wheezing. (Patient not taking: Reported on 05/03/2014) 05/03/2014: Uses prn, mainly in the fall (at night); not needing recently  . co-enzyme Q-10 30 MG capsule Take 30 mg by mouth daily. 05/03/2014: Takes occasionally   Took 2 Aleve yesterday morning  No Known Allergies  ROS:  No dizziness, chest pain, shortness of breath, nausea, vomiting, diarrhea, rash, bleeding, bruising, swelling.  See HPI.  PHYSICAL EXAM: BP 120/70 mmHg  Pulse 56  Temp(Src) 100 F (37.8 C) (Tympanic)  Ht 5\' 9"  (1.753 m)  Wt 173 lb (78.472 kg)  BMI 25.54 kg/m2 Mildly ill appearing male, rare cough. Appears tired. HEENT: PERRL, EOMI, conjunctiva clear. TM's and  EAC's normal. Nasal mucosa is moderately edematous, L>R without purulence.  Sinuses are nontender. OP is clear Neck: no lymphadenopathy or mass Heart: regular rate and rhythm, no murmur Lungs: clear bilaterally Skin: no rash   Flu swab--negative  ASSESSMENT/PLAN:  Cough - Plan: HYDROcodone-homatropine (HYCODAN) 5-1.5 MG/5ML syrup, POC Influenza A&B (Binax test)  Acute upper respiratory infection - Plan: POC Influenza A&B (Binax test)  Fever, unspecified fever cause - Plan: POC Influenza A&B (Binax test)   Drink plenty of fluids. Continue to use aleve OR ibuprofen, and/or tylenol as needed for fever or pain. Use decongestants if increasing nasal congestion and sinus pain. Use mucinex (guaifenesin) to help with cough. Use the cough syrup at bedtime--it will make you sleepy. Return next week if persistent fevers, worsening cough, shortness of breath, or other new symptoms develop

## 2014-05-03 NOTE — Patient Instructions (Addendum)
  Drink plenty of fluids. Continue to use aleve OR ibuprofen, and/or tylenol as needed for fever or pain. Use decongestants if increasing nasal congestion and sinus pain. Use mucinex (guaifenesin) to help with cough. Use the cough syrup at bedtime--it will make you sleepy. Return next week if persistent fevers, worsening cough, shortness of breath, or other new symptoms develop  Return for flu shot when no longer having fevers. Try and get earlier next year (in the fall--September/october)

## 2014-05-07 ENCOUNTER — Ambulatory Visit: Payer: BLUE CROSS/BLUE SHIELD | Admitting: Family Medicine

## 2014-05-07 ENCOUNTER — Encounter: Payer: Self-pay | Admitting: Family Medicine

## 2014-05-07 ENCOUNTER — Ambulatory Visit (INDEPENDENT_AMBULATORY_CARE_PROVIDER_SITE_OTHER): Payer: BLUE CROSS/BLUE SHIELD | Admitting: Family Medicine

## 2014-05-07 VITALS — BP 130/80 | HR 56 | Temp 97.0°F | Ht 69.0 in | Wt 172.0 lb

## 2014-05-07 DIAGNOSIS — R059 Cough, unspecified: Secondary | ICD-10-CM

## 2014-05-07 DIAGNOSIS — J019 Acute sinusitis, unspecified: Secondary | ICD-10-CM

## 2014-05-07 DIAGNOSIS — J069 Acute upper respiratory infection, unspecified: Secondary | ICD-10-CM

## 2014-05-07 DIAGNOSIS — R05 Cough: Secondary | ICD-10-CM

## 2014-05-07 MED ORDER — AMOXICILLIN 500 MG PO TABS: 1000.0000 mg | ORAL_TABLET | Freq: Two times a day (BID) | ORAL | Status: DC

## 2014-05-07 MED ORDER — BENZONATATE 200 MG PO CAPS: 200.0000 mg | ORAL_CAPSULE | Freq: Three times a day (TID) | ORAL | Status: DC | PRN

## 2014-05-07 NOTE — Progress Notes (Signed)
Chief Complaint  Patient presents with  . Cough    still coughing. Coughing up dark brownish/greenish mucuc that tasted horrible. Reall hardly getting any mucus to come up though. Fever comes and goes-Saturday was 99.9ish.   Patient states he is "not any better".  Still coughing, not quite as bad.  He took Mucinex DM, then switched to the regular--can't tell a difference.  He has been taking the hycodan syrup at bedtime--it isn't making him sleepy at all.  Helps some with the cough.  2 days ago he had T99.8, no fever yesterday or day. He had fevers initially (1/19-21),  They seemed to go away, but came back again on 1/23.  He isn't coughing much up, but what he gets up is dark brownish-green, and tastes horrible.  Very hard to get anything up--feels like he is coughing to get a tickle out of his throat.  Denies any nasal congestion, just runny nose in the cold weather (watery).  He has some ear plugging, and tightness across the forehead  Symptoms originally started 1/19--see last visit.  PMH, PSH, SH reviewed.  Outpatient Encounter Prescriptions as of 05/07/2014  Medication Sig  . co-enzyme Q-10 30 MG capsule Take 30 mg by mouth daily.  . fish oil-omega-3 fatty acids 1000 MG capsule Take 1 g by mouth daily.   Marland Kitchen guaiFENesin (MUCINEX) 600 MG 12 hr tablet Take 600 mg by mouth 2 (two) times daily.  Marland Kitchen HYDROcodone-homatropine (HYCODAN) 5-1.5 MG/5ML syrup Take 5 mLs by mouth every 8 (eight) hours as needed for cough. This will make you sleepy--mainly use at bedtime  . Pyridoxine HCl (VITAMIN B-6) 500 MG tablet Take 500 mg by mouth daily.    . vitamin B-12 (CYANOCOBALAMIN) 1000 MCG tablet Take 1,000 mcg by mouth daily.  . vitamin C (ASCORBIC ACID) 500 MG tablet Take 500 mg by mouth daily.  Marland Kitchen albuterol (PROVENTIL HFA;VENTOLIN HFA) 108 (90 BASE) MCG/ACT inhaler Inhale 2 puffs into the lungs every 6 (six) hours as needed for wheezing. (Patient not taking: Reported on 05/03/2014)  He has not needed to  use inhaler He is also on Flovent (to swallow, not inhale)--but hasn't used in the last couple of days.  No Known Allergies  ROS:  Fevers as per HPI.  No headache, dizziness.  Denies chest pain, shortness of breath, nausea, vomiting, diarrhea, rash, bleeding, bruising or other concerns except as noted in HPI  PHYSICAL EXAM: BP 130/80 mmHg  Pulse 56  Temp(Src) 97 F (36.1 C) (Tympanic)  Ht 5\' 9"  (1.753 m)  Wt 172 lb (78.019 kg)  BMI 25.39 kg/m2 Well appearing male in no distress, rare cough.  He appears better than at last visit HEENT: PERRL, EOMI, conjunctiva and sclera clear.  TM's and EAC's normal.  Nasal mucosa is moderately edematous bilaterally. No purulence or significant erythema noted.  Sinuses are nontender.  OP is clear Neck: no lymphadenopathy, thyromegaly or mass Heart: regular rate and rhythm without murmur Lungs: clear bilaterally with good air movement. No wheezes, rales, ronchi Skin: no rash, lesions Psych: normal mood, affect, hygiene and grooming Neuro: alert and oriented.  Cranial nerves intact, normal gait, strength  ASSESSMENT/PLAN:  Acute upper respiratory infection  Acute sinusitis, recurrence not specified, unspecified location - possible early bacterial infection, although still in the window of viral.  Hold off on ABX for now - Plan: amoxicillin (AMOXIL) 500 MG tablet  Cough - Plan: benzonatate (TESSALON) 200 MG capsule   Drink plenty of fluid--this keeps the mucus thin.  Continue to use Mucinex (plain or DM) to keep the mucus thin. Start taking decongestant (ie sudafed) to help with sinus congestion, ear plugging and lessen the post-nasal drainage.  Use the Tessalon cough medication as needed for cough. Start the antibiotics tomorrow (or the next day) if you don't see gradual improvement with these measures (start sooner if recurrent fevers, or clearly getting worse).

## 2014-05-07 NOTE — Patient Instructions (Signed)
  Drink plenty of fluid--this keeps the mucus thin. Continue to use Mucinex (plain or DM) to keep the mucus thin. Start taking decongestant (ie sudafed) to help with sinus congestion, ear plugging and lessen the post-nasal drainage.  Use the Tessalon cough medication as needed for cough. Start the antibiotics tomorrow (or the next day) if you don't see gradual improvement with these measures (start sooner if recurrent fevers, or clearly getting worse).

## 2014-06-28 ENCOUNTER — Ambulatory Visit (INDEPENDENT_AMBULATORY_CARE_PROVIDER_SITE_OTHER): Payer: BLUE CROSS/BLUE SHIELD | Admitting: Family Medicine

## 2014-06-28 ENCOUNTER — Encounter: Payer: Self-pay | Admitting: Family Medicine

## 2014-06-28 VITALS — BP 110/70 | HR 60 | Ht 68.5 in | Wt 159.4 lb

## 2014-06-28 DIAGNOSIS — R634 Abnormal weight loss: Secondary | ICD-10-CM

## 2014-06-28 DIAGNOSIS — R3129 Other microscopic hematuria: Secondary | ICD-10-CM

## 2014-06-28 DIAGNOSIS — K200 Eosinophilic esophagitis: Secondary | ICD-10-CM

## 2014-06-28 DIAGNOSIS — E871 Hypo-osmolality and hyponatremia: Secondary | ICD-10-CM

## 2014-06-28 DIAGNOSIS — R5383 Other fatigue: Secondary | ICD-10-CM | POA: Diagnosis not present

## 2014-06-28 DIAGNOSIS — R05 Cough: Secondary | ICD-10-CM

## 2014-06-28 DIAGNOSIS — Z0000 Encounter for general adult medical examination without abnormal findings: Secondary | ICD-10-CM

## 2014-06-28 DIAGNOSIS — Z125 Encounter for screening for malignant neoplasm of prostate: Secondary | ICD-10-CM

## 2014-06-28 DIAGNOSIS — E785 Hyperlipidemia, unspecified: Secondary | ICD-10-CM | POA: Diagnosis not present

## 2014-06-28 DIAGNOSIS — R312 Other microscopic hematuria: Secondary | ICD-10-CM

## 2014-06-28 DIAGNOSIS — J45990 Exercise induced bronchospasm: Secondary | ICD-10-CM

## 2014-06-28 DIAGNOSIS — R059 Cough, unspecified: Secondary | ICD-10-CM

## 2014-06-28 LAB — COMPREHENSIVE METABOLIC PANEL
ALT: 19 U/L (ref 0–53)
AST: 37 U/L (ref 0–37)
Albumin: 4.3 g/dL (ref 3.5–5.2)
Alkaline Phosphatase: 101 U/L (ref 39–117)
BUN: 13 mg/dL (ref 6–23)
CALCIUM: 9.5 mg/dL (ref 8.4–10.5)
CHLORIDE: 95 meq/L — AB (ref 96–112)
CO2: 27 mEq/L (ref 19–32)
Creat: 0.75 mg/dL (ref 0.50–1.35)
GLUCOSE: 89 mg/dL (ref 70–99)
POTASSIUM: 4.7 meq/L (ref 3.5–5.3)
Sodium: 130 mEq/L — ABNORMAL LOW (ref 135–145)
Total Bilirubin: 0.7 mg/dL (ref 0.2–1.2)
Total Protein: 7 g/dL (ref 6.0–8.3)

## 2014-06-28 LAB — CBC WITH DIFFERENTIAL/PLATELET
BASOS ABS: 0 10*3/uL (ref 0.0–0.1)
Basophils Relative: 0 % (ref 0–1)
Eosinophils Absolute: 0.2 10*3/uL (ref 0.0–0.7)
Eosinophils Relative: 4 % (ref 0–5)
HCT: 40.9 % (ref 39.0–52.0)
Hemoglobin: 13.6 g/dL (ref 13.0–17.0)
Lymphocytes Relative: 30 % (ref 12–46)
Lymphs Abs: 1.6 10*3/uL (ref 0.7–4.0)
MCH: 31.1 pg (ref 26.0–34.0)
MCHC: 33.3 g/dL (ref 30.0–36.0)
MCV: 93.4 fL (ref 78.0–100.0)
MPV: 9.3 fL (ref 8.6–12.4)
Monocytes Absolute: 0.7 10*3/uL (ref 0.1–1.0)
Monocytes Relative: 13 % — ABNORMAL HIGH (ref 3–12)
NEUTROS ABS: 2.8 10*3/uL (ref 1.7–7.7)
Neutrophils Relative %: 53 % (ref 43–77)
Platelets: 241 10*3/uL (ref 150–400)
RBC: 4.38 MIL/uL (ref 4.22–5.81)
RDW: 14.3 % (ref 11.5–15.5)
WBC: 5.3 10*3/uL (ref 4.0–10.5)

## 2014-06-28 LAB — POCT URINALYSIS DIPSTICK
Bilirubin, UA: NEGATIVE
Blood, UA: NEGATIVE
Glucose, UA: NEGATIVE
KETONES UA: NEGATIVE
Leukocytes, UA: NEGATIVE
Nitrite, UA: NEGATIVE
PH UA: 7
Protein, UA: NEGATIVE
SPEC GRAV UA: 1.015
UROBILINOGEN UA: NEGATIVE

## 2014-06-28 LAB — LIPID PANEL
CHOL/HDL RATIO: 2.4 ratio
Cholesterol: 194 mg/dL (ref 0–200)
HDL: 82 mg/dL (ref >=40)
LDL Cholesterol: 102 mg/dL — ABNORMAL HIGH (ref 0–99)
TRIGLYCERIDES: 49 mg/dL (ref <150)
VLDL: 10 mg/dL (ref 0–40)

## 2014-06-28 LAB — TSH: TSH: 2.823 u[IU]/mL (ref 0.350–4.500)

## 2014-06-28 MED ORDER — ALBUTEROL SULFATE HFA 108 (90 BASE) MCG/ACT IN AERS: 2.0000 | INHALATION_SPRAY | Freq: Four times a day (QID) | RESPIRATORY_TRACT | Status: DC | PRN

## 2014-06-28 NOTE — Patient Instructions (Addendum)
  HEALTH MAINTENANCE RECOMMENDATIONS:  It is recommended that you get at least 30 minutes of aerobic exercise at least 5 days/week (for weight loss, you may need as much as 60-90 minutes). This can be any activity that gets your heart rate up. This can be divided in 10-15 minute intervals if needed, but try and build up your endurance at least once a week.  Weight bearing exercise is also recommended twice weekly.  Eat a healthy diet with lots of vegetables, fruits and fiber.  "Colorful" foods have a lot of vitamins (ie green vegetables, tomatoes, red peppers, etc).  Limit sweet tea, regular sodas and alcoholic beverages, all of which has a lot of calories and sugar.  Up to 2 alcoholic drinks daily may be beneficial for men (unless trying to lose weight, watch sugars).  Drink a lot of water.  Sunscreen of at least SPF 30 should be used on all sun-exposed parts of the skin when outside between the hours of 10 am and 4 pm (not just when at beach or pool, but even with exercise, golf, tennis, and yard work!)  Use a sunscreen that says "broad spectrum" so it covers both UVA and UVB rays, and make sure to reapply every 1-2 hours.  Remember to change the batteries in your smoke detectors when changing your clock times in the spring and fall.  Use your seat belt every time you are in a car, and please drive safely and not be distracted with cell phones and texting while driving.   Come back in the fall for flu shot

## 2014-06-28 NOTE — Progress Notes (Signed)
Chief Complaint  Patient presents with  . Annual Exam    fasting annual exam. Did not do eye exam as he had one 2 weeks ago with Dr.Byrnes. And could not do UA as water is turned off. Needs refill on albuterol. States that he still has cough, mostly after exercising.    Wayne Brandt is a 64 y.o. male who presents for a complete physical.  He has the following concerns:  Last seen 6 weeks ago with cough.  He was rx'd amoxicillin to take if symptoms persisted, which he did ultimately take.  He is much better, but has some residual cough, mainly after exercise (see below).  H/o RAD/asthma. He last had PFT's 03/2013.  He is asking for refill on albuterol. He has persistent cough after exercise since his recent illness. He has not tried using the inhaler when having coughing spells after exercise.  Peyronie's disease--he last saw urology about a year ago.  Treatment wasn't recommended; he does not have any discomfort.  He had been having dysphagia, so he saw Dr. Earlean Shawl and had esophageal dilation and symptoms resolved.  He was also started on an inhaler that he was directed to [facility], to help with esophageal symptoms.  He didn't bring inhaler with him--just ran out recently.   Review of last dilatation procedure note mentioned eosinophilic esophagitis and Budesonide inhaler (no dose mentioned).  He also had colonoscopy 02/2014, reportedly normal  Immunization History  Administered Date(s) Administered  . Influenza Whole 01/05/2008  . Influenza,inj,Quad PF,36+ Mos 02/01/2013  . Pneumococcal Polysaccharide-23 06/01/2007  . Td 06/20/1997  . Tdap 01/05/2008  . Zoster 02/25/2012  He didn't get a flu shot this year ("for some reason"--plans to continue to get yearly) Last colonoscopy: 02/2014; pt states told 10 years Last PSA: 06/2013 Dentist: twice yearly  Ophtho: yearly Exercise: daily (elliptical, bike). Tennis, golf, yardwork.  Exercises daily.  Past Medical History  Diagnosis Date   . HH (hiatus hernia)   . Esophageal stricture     Dr. Earlean Shawl  . Actinic keratosis     Dr. Tonia Brooms  . Arthritis     wrists, L thumb  . Benign hematuria   . Hyponatremia     127-134 range  . Mitral valve prolapse     ruled out per patient by echo  . Polio 1956  . BCC (basal cell carcinoma of skin) 05/2011    chest  . Microscopic hematuria 11/2011    negative w/u per Dr. Jasmine December (CT, cystoscopy)  . Asthma     since childhood    Past Surgical History  Procedure Laterality Date  . Total knee arthroplasty  11/07    RIGHT (Aluisio)  . Ankle fusion  1997    LEFT  . Wrist surgery      right (Dr. Burney Gauze)  . Ulnar nerve surgery      Right (Dr. Burney Gauze)  . Cardiovascular stress test  2006    normal--Dr. Percival Spanish  . Esophagogastroduodenoscopy  2009    Dr. Earlean Shawl  . Colonoscopy  2009    due again 2014  . Esophageal dilation  07/2010, 03/2011  . Band hemorrhoidectomy      Dr. Earlean Shawl  . Cystoscopy  11/2011    normal; Dr. Jasmine December  . Colonoscopy, esophagogastroduodenoscopy (egd) and esophageal dilation  02/15/2014    Dr.Medoff    History   Social History  . Marital Status: Married    Spouse Name: N/A  . Number of Children: 1  . Years of  Education: N/A   Occupational History  . 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 to [employer]   Social History Main Topics  . Smoking status: Never Smoker   . Smokeless tobacco: Never Used  . Alcohol Use: Yes     Comment: couple beers after working out, daily  . Drug Use: No  . Sexual Activity: Not Currently   Other Topics Concern  . Not on file   Social History Narrative   Lives with wife, 1 cat.  Son from a previous marriage lives in [place].  1 granddaughter. Cut back to working part-time.    Family History  Problem Relation Age of Onset  . Alcohol abuse Mother   . Cancer Mother     male cancer in 69's  . Heart disease Father 12    MI  . Heart disease Brother 42    MI  . Glaucoma Brother   . Diabetes Neg Hx   . Colon  cancer Neg Hx   . Heart disease Brother 48    stents    Outpatient Encounter Prescriptions as of 06/28/2014  Medication Sig Note  . albuterol (PROVENTIL HFA;VENTOLIN HFA) 108 (90 BASE) MCG/ACT inhaler Inhale 2 puffs into the lungs every 6 (six) hours as needed for wheezing. 06/28/2014: He used with recent illness; usually just needs in the fall  . co-enzyme Q-10 30 MG capsule Take 30 mg by mouth daily. 05/03/2014: Takes occasionally  . fish oil-omega-3 fatty acids 1000 MG capsule Take 1 g by mouth daily.    . Pyridoxine HCl (VITAMIN B-6) 500 MG tablet Take 500 mg by mouth daily.     . vitamin B-12 (CYANOCOBALAMIN) 1000 MCG tablet Take 1,000 mcg by mouth daily.   . vitamin C (ASCORBIC ACID) 500 MG tablet Take 500 mg by mouth daily.   . [DISCONTINUED] amoxicillin (AMOXIL) 500 MG tablet Take 2 tablets (1,000 mg total) by mouth 2 (two) times daily.   . [DISCONTINUED] benzonatate (TESSALON) 200 MG capsule Take 1 capsule (200 mg total) by mouth 3 (three) times daily as needed.   . [DISCONTINUED] guaiFENesin (MUCINEX) 600 MG 12 hr tablet Take 600 mg by mouth 2 (two) times daily.   . [DISCONTINUED] HYDROcodone-homatropine (HYCODAN) 5-1.5 MG/5ML syrup Take 5 mLs by mouth every 8 (eight) hours as needed for cough. This will make you sleepy--mainly use at bedtime   Budesonide inher 2 puffs BID (?dose)--from Dr. Earlean Shawl, to [facility] for EE (not for asthma)  No Known Allergies  ROS: The patient denies anorexia, fever, headaches, vision loss, decreased hearing, ear pain, hoarseness, chest pain, palpitations, dizziness, syncope, dyspnea on exertion (just cough after exercise; had some dyspnea with recent illness, resolved), swelling, nausea, vomiting, diarrhea, constipation, abdominal pain, melena, hematochezia, indigestion/heartburn, hematuria, incontinence, erectile dysfunction, nocturia, weakened urine stream, dysuria, weakness, tremor, suspicious skin lesions (sees derm regularly), depression, anxiety, abnormal  bleeding/bruising, or enlarged lymph nodes  +occasional jaw pain if eats hard foods--avoids it and hasn't had pain. No problems in a few years H/o microscopic hematuria--no visible blood per pt  No rectal bleeding since getting hemorrhoidal banding  Occasional tingling R hand from ulnar nerve issue (chronic)--not lately, doing better. Thinks the B6 he takes helps. Worse in cold weather. No weakness. Occasional L thumb pain if uses screwdriver a lot (known arthritis), occasional bilateral wrist pain. Currently denies pain. Dysphagia resolved with recent esophageal dilatation. Voids frequently due to caffeine and fluid intake. Up 2-3x at night to void (2 beers in the evening). Not in a sexual  relationship due to wife's health issues. 10# weight loss since his physical last year--intentional; has been exercising daily.  PHYSICAL EXAM:  BP 110/70 mmHg  Pulse 60  Ht 5' 8.5" (1.74 m)  Wt 159 lb 6.4 oz (72.303 kg)  BMI 23.88 kg/m2   General Appearance:  Alert, cooperative, no distress, appears stated age   Head:  Normocephalic, without obvious abnormality, atraumatic   Eyes:  PERRL, conjunctiva/corneas clear, EOM's intact, fundi  benign   Ears:  Normal TM's and external ear canals   Nose:  Nares normal, no erythema or purulence, no sinus tenderness   Throat:  Lips, mucosa, and tongue normal; teeth and gums normal.   Neck:  Supple, no lymphadenopathy; thyroid: no enlargement/tenderness/nodules; no carotid  bruit or JVD   Back:  Spine nontender, no curvature, ROM normal, no CVA tenderness   Lungs:  Clear to auscultation bilaterally without wheezes, rales or ronchi; respirations unlabored   Chest Wall:  No tenderness or deformity   Heart:  Regular rate and rhythm, S1 and S2 normal, no murmur, rub  or gallop   Breast Exam:  No chest wall tenderness, masses or gynecomastia   Abdomen:  Soft, non-tender, nondistended, normoactive bowel sounds,  no masses, no  hepatosplenomegaly   Genitalia:  Normal male external genitalia without lesions. There is slight curvature of the distal portion of the penis. Testicles without masses. No inguinal hernias.   Rectal:  Normal sphincter tone, no masses or tenderness; guaiac negative stool. Prostate smooth, no nodules, not enlarged.   Extremities:  No clubbing, cyanosis or edema. +leg length discrepancy--left is shorter than right leg (since L ankle fusion per pt). L ankle fused; WHSS R knee, which is slightly swollen and warmer than the left  Pulses:  2+ and symmetric all extremities   Skin:  Skin color, texture, turgor normal, no rashes. Actinic damage throughout, with rough patches consistent with AK's on both forearms, and a few residual lesions on scalp  Lymph nodes:  Cervical, supraclavicular, and axillary nodes normal   Neurologic:  CNII-XII intact, normal strength, sensation and gait; reflexes 2+ and symmetric throughout (absent at both knees)  Psych: Normal mood, affect, hygiene and grooming.         ASSESSMENT/PLAN:  Cough - Plan: albuterol (PROVENTIL HFA;VENTOLIN HFA) 108 (90 BASE) MCG/ACT inhaler, Spirometry with Graph, Spirometry with Graph  Microscopic hematuria - benign w/u.  urine dip negative today - Plan: POCT Urinalysis Dipstick  Hyponatremia - chronic, asymptomatic.  recheck - Plan: Comprehensive metabolic panel  Exercise-induced asthma - known asthmatic with some residual coughing after exercise, suspect due to RAD. refilled albuterol  PFT's today.  Use MDI prior to exercse rather than rescue - Plan: albuterol (PROVENTIL HFA;VENTOLIN HFA) 108 (90 BASE) MCG/ACT inhaler, Spirometry with Graph, Spirometry with Graph  Hyperlipidemia - overall has been good; pt desires repeat - Plan: Lipid panel  Annual physical exam - Plan: Lipid panel, Comprehensive metabolic panel, Vit D  25 hydroxy (rtn osteoporosis monitoring), TSH, PSA  Loss of weight - intentional--exercising much more over  the last couple of months (using up his vacation days) - Plan: Comprehensive metabolic panel, CBC with Differential/Platelet, TSH  Prostate cancer screening - Plan: PSA  Other fatigue - Plan: Comprehensive metabolic panel, CBC with Differential/Platelet, Vit D  25 hydroxy (rtn osteoporosis monitoring), TSH  Eosinophilic esophagitis - per Dr. Liliane Channel notes. Continue with Budesonide inhaler (swallow). Advised that if inhaled, this would help asthma, but continue swallowing to tx EE for now  Discussed PSA screening (risks/benefits), recommended at least 30 minutes of aerobic activity at least 5 days/week; proper sunscreen use reviewed; healthy diet and alcohol recommendations (less than or equal to 2 drinks/day) reviewed; regular seatbelt use; changing batteries in smoke detectors. Self-testicular exams. Immunization recommendations discussed--to get Prevnar 19 at age 37. Yearly flu shots (not given today--so late in season, to get in the Fall). Colonoscopy recommendations reviewed--UTD.  Lipids have been checked every 2 years, and have been good.  HDL is excellent.  He prefers to have this checked again today.  PSA TSH (due to weight loss)   F/u 1 year for CPE (will be 64 then--need to see what insurance he will get to decide if CPE or IPPE or medcheck+/AWV) F/u sooner if persistent/ongoing respiratory symptoms

## 2014-06-29 ENCOUNTER — Encounter: Payer: Self-pay | Admitting: Family Medicine

## 2014-06-29 DIAGNOSIS — E559 Vitamin D deficiency, unspecified: Secondary | ICD-10-CM | POA: Insufficient documentation

## 2014-06-29 LAB — VITAMIN D 25 HYDROXY (VIT D DEFICIENCY, FRACTURES): Vit D, 25-Hydroxy: 27 ng/mL — ABNORMAL LOW (ref 30–100)

## 2014-06-29 LAB — PSA: PSA: 0.73 ng/mL (ref <=4.00)

## 2014-10-08 ENCOUNTER — Other Ambulatory Visit: Payer: Self-pay

## 2015-05-28 ENCOUNTER — Other Ambulatory Visit (INDEPENDENT_AMBULATORY_CARE_PROVIDER_SITE_OTHER): Payer: BLUE CROSS/BLUE SHIELD

## 2015-05-28 DIAGNOSIS — Z23 Encounter for immunization: Secondary | ICD-10-CM | POA: Diagnosis not present

## 2015-07-04 ENCOUNTER — Encounter: Payer: Self-pay | Admitting: Family Medicine

## 2015-07-04 ENCOUNTER — Ambulatory Visit (INDEPENDENT_AMBULATORY_CARE_PROVIDER_SITE_OTHER): Payer: BLUE CROSS/BLUE SHIELD | Admitting: Family Medicine

## 2015-07-04 VITALS — BP 148/78 | HR 68 | Ht 68.25 in | Wt 163.4 lb

## 2015-07-04 DIAGNOSIS — Z0000 Encounter for general adult medical examination without abnormal findings: Secondary | ICD-10-CM

## 2015-07-04 DIAGNOSIS — Z125 Encounter for screening for malignant neoplasm of prostate: Secondary | ICD-10-CM | POA: Diagnosis not present

## 2015-07-04 DIAGNOSIS — Z23 Encounter for immunization: Secondary | ICD-10-CM | POA: Diagnosis not present

## 2015-07-04 DIAGNOSIS — K200 Eosinophilic esophagitis: Secondary | ICD-10-CM | POA: Diagnosis not present

## 2015-07-04 DIAGNOSIS — J45990 Exercise induced bronchospasm: Secondary | ICD-10-CM | POA: Diagnosis not present

## 2015-07-04 DIAGNOSIS — IMO0001 Reserved for inherently not codable concepts without codable children: Secondary | ICD-10-CM

## 2015-07-04 DIAGNOSIS — R030 Elevated blood-pressure reading, without diagnosis of hypertension: Secondary | ICD-10-CM | POA: Diagnosis not present

## 2015-07-04 DIAGNOSIS — N529 Male erectile dysfunction, unspecified: Secondary | ICD-10-CM | POA: Diagnosis not present

## 2015-07-04 DIAGNOSIS — E871 Hypo-osmolality and hyponatremia: Secondary | ICD-10-CM | POA: Diagnosis not present

## 2015-07-04 DIAGNOSIS — E559 Vitamin D deficiency, unspecified: Secondary | ICD-10-CM

## 2015-07-04 LAB — POCT URINALYSIS DIPSTICK
Bilirubin, UA: NEGATIVE
GLUCOSE UA: NEGATIVE
KETONES UA: NEGATIVE
Nitrite, UA: NEGATIVE
PROTEIN UA: NEGATIVE
SPEC GRAV UA: 1.015
Urobilinogen, UA: NEGATIVE
pH, UA: 7

## 2015-07-04 LAB — COMPREHENSIVE METABOLIC PANEL
ALBUMIN: 4 g/dL (ref 3.6–5.1)
ALT: 17 U/L (ref 9–46)
AST: 28 U/L (ref 10–35)
Alkaline Phosphatase: 82 U/L (ref 40–115)
BUN: 9 mg/dL (ref 7–25)
CHLORIDE: 95 mmol/L — AB (ref 98–110)
CO2: 26 mmol/L (ref 20–31)
Calcium: 9.2 mg/dL (ref 8.6–10.3)
Creat: 0.72 mg/dL (ref 0.70–1.25)
Glucose, Bld: 77 mg/dL (ref 65–99)
POTASSIUM: 4.5 mmol/L (ref 3.5–5.3)
Sodium: 131 mmol/L — ABNORMAL LOW (ref 135–146)
Total Bilirubin: 0.5 mg/dL (ref 0.2–1.2)
Total Protein: 7.3 g/dL (ref 6.1–8.1)

## 2015-07-04 MED ORDER — SILDENAFIL CITRATE 20 MG PO TABS: ORAL_TABLET | ORAL | Status: DC

## 2015-07-04 NOTE — Patient Instructions (Addendum)
HEALTH MAINTENANCE RECOMMENDATIONS:  It is recommended that you get at least 30 minutes of aerobic exercise at least 5 days/week (for weight loss, you may need as much as 60-90 minutes). This can be any activity that gets your heart rate up. This can be divided in 10-15 minute intervals if needed, but try and build up your endurance at least once a week.  Weight bearing exercise is also recommended twice weekly.  Eat a healthy diet with lots of vegetables, fruits and fiber.  "Colorful" foods have a lot of vitamins (ie green vegetables, tomatoes, red peppers, etc).  Limit sweet tea, regular sodas and alcoholic beverages, all of which has a lot of calories and sugar.  Up to 2 alcoholic drinks daily may be beneficial for men (unless trying to lose weight, watch sugars).  Drink a lot of water.  Sunscreen of at least SPF 30 should be used on all sun-exposed parts of the skin when outside between the hours of 10 am and 4 pm (not just when at beach or pool, but even with exercise, golf, tennis, and yard work!)  Use a sunscreen that says "broad spectrum" so it covers both UVA and UVB rays, and make sure to reapply every 1-2 hours.  Remember to change the batteries in your smoke detectors when changing your clock times in the spring and fall.  Use your seat belt every time you are in a car, and please drive safely and not be distracted with cell phones and texting while driving.  Your blood pressure was elevated today. Please cut back on the sodium in your diet (see below). Check your blood pressure once or twice a week at the pharmacy, write it down, and bring back here in 1 month at a follow-up office visit.  If your blood pressure remains elevated, we may need to start treatment.  Limit use of decongestants (sinus medications).  Low-Sodium Eating Plan Sodium raises blood pressure and causes water to be held in the body. Getting less sodium from food will help lower your blood pressure, reduce any  swelling, and protect your heart, liver, and kidneys. We get sodium by adding salt (sodium chloride) to food. Most of our sodium comes from canned, boxed, and frozen foods. Restaurant foods, fast foods, and pizza are also very high in sodium. Even if you take medicine to lower your blood pressure or to reduce fluid in your body, getting less sodium from your food is important. WHAT IS MY PLAN? Most people should limit their sodium intake to 2,300 mg a day. Your health care provider recommends that you limit your sodium intake to __________ a day.  WHAT DO I NEED TO KNOW ABOUT THIS EATING PLAN? For the low-sodium eating plan, you will follow these general guidelines:  Choose foods with a % Daily Value for sodium of less than 5% (as listed on the food label).   Use salt-free seasonings or herbs instead of table salt or sea salt.   Check with your health care provider or pharmacist before using salt substitutes.   Eat fresh foods.  Eat more vegetables and fruits.  Limit canned vegetables. If you do use them, rinse them well to decrease the sodium.   Limit cheese to 1 oz (28 g) per day.   Eat lower-sodium products, often labeled as "lower sodium" or "no salt added."  Avoid foods that contain monosodium glutamate (MSG). MSG is sometimes added to [place] food and some canned foods.  Check food labels (Nutrition Facts  labels) on foods to learn how much sodium is in one serving.  Eat more home-cooked food and less restaurant, buffet, and fast food.  When eating at a restaurant, ask that your food be prepared with less salt, or no salt if possible.  HOW DO I READ FOOD LABELS FOR SODIUM INFORMATION? The Nutrition Facts label lists the amount of sodium in one serving of the food. If you eat more than one serving, you must multiply the listed amount of sodium by the number of servings. Food labels may also identify foods as:  Sodium free--Less than 5 mg in a serving.  Very low  sodium--35 mg or less in a serving.  Low sodium--140 mg or less in a serving.  Light in sodium--50% less sodium in a serving. For example, if a food that usually has 300 mg of sodium is changed to become light in sodium, it will have 150 mg of sodium.  Reduced sodium--25% less sodium in a serving. For example, if a food that usually has 400 mg of sodium is changed to reduced sodium, it will have 300 mg of sodium. WHAT FOODS CAN I EAT? Grains Low-sodium cereals, including oats, puffed wheat and rice, and shredded wheat cereals. Low-sodium crackers. Unsalted rice and pasta. Lower-sodium bread.  Vegetables Frozen or fresh vegetables. Low-sodium or reduced-sodium canned vegetables. Low-sodium or reduced-sodium tomato sauce and paste. Low-sodium or reduced-sodium tomato and vegetable juices.  Fruits Fresh, frozen, and canned fruit. Fruit juice.  Meat and Other Protein Products Low-sodium canned tuna and salmon. Fresh or frozen meat, poultry, seafood, and fish. Lamb. Unsalted nuts. Dried beans, peas, and lentils without added salt. Unsalted canned beans. Homemade soups without salt. Eggs.  Dairy Milk. Soy milk. Ricotta cheese. Low-sodium or reduced-sodium cheeses. Yogurt.  Condiments Fresh and dried herbs and spices. Salt-free seasonings. Onion and garlic powders. Low-sodium varieties of mustard and ketchup. Fresh or refrigerated horseradish. Lemon juice.  Fats and Oils Reduced-sodium salad dressings. Unsalted butter.  Other Unsalted popcorn and pretzels.  The items listed above may not be a complete list of recommended foods or beverages. Contact your dietitian for more options. WHAT FOODS ARE NOT RECOMMENDED? Grains Instant hot cereals. Bread stuffing, pancake, and biscuit mixes. Croutons. Seasoned rice or pasta mixes. Noodle soup cups. Boxed or frozen macaroni and cheese. Self-rising flour. Regular salted crackers. Vegetables Regular canned vegetables. Regular canned tomato  sauce and paste. Regular tomato and vegetable juices. Frozen vegetables in sauces. Salted [place] fries. Olives. Angie Fava. Relishes. Sauerkraut. Salsa. Meat and Other Protein Products Salted, canned, smoked, spiced, or pickled meats, seafood, or fish. Bacon, ham, sausage, hot dogs, corned beef, chipped beef, and packaged luncheon meats. Salt pork. Jerky. Pickled herring. Anchovies, regular canned tuna, and sardines. Salted nuts. Dairy Processed cheese and cheese spreads. Cheese curds. Blue cheese and cottage cheese. Buttermilk.  Condiments Onion and garlic salt, seasoned salt, table salt, and sea salt. Canned and packaged gravies. Worcestershire sauce. Tartar sauce. Barbecue sauce. Teriyaki sauce. Soy sauce, including reduced sodium. Steak sauce. Fish sauce. Oyster sauce. Cocktail sauce. Horseradish that you find on the shelf. Regular ketchup and mustard. Meat flavorings and tenderizers. Bouillon cubes. Hot sauce. Tabasco sauce. Marinades. Taco seasonings. Relishes. Fats and Oils Regular salad dressings. Salted butter. Margarine. Ghee. Bacon fat.  Other Potato and tortilla chips. Corn chips and puffs. Salted popcorn and pretzels. Canned or dried soups. Pizza. Frozen entrees and pot pies.  The items listed above may not be a complete list of foods and beverages to avoid. Contact  your dietitian for more information.   This information is not intended to replace advice given to you by your health care provider. Make sure you discuss any questions you have with your health care provider.   Document Released: 09/19/2001 Document Revised: 04/20/2014 Document Reviewed: 02/01/2013 Elsevier Interactive Patient Education [employer].

## 2015-07-04 NOTE — Progress Notes (Signed)
Chief Complaint  Patient presents with  . Annual Exam    fasting annual exam. Did not do eye exam as he just had one. UA showed trace leuks, no symtoms.    Wayne Brandt is a 65 y.o. male who presents for a complete physical.  He has the following concerns:  H/o RAD/asthma.  Has some chest tightness in the fall at night. Denies any problems now, not needing to use albuterol with exercise recently.   Peyronie's disease--He has seen urology in the past for Peyronie's, and for hematuria.  He doesn't see them regularly, has been over a year.  He is asking for prescription for Viagra.  He is having erectile problems, trouble maintaining erection.  He never required any treatment for Peyronies, asymtomatic.  Eosinophilic esophagitis: He has had about 5 episodes of dysphagia, and the last time he couldn't get the food down until the next morning.  Certain foods are harder to swallow--meats, hard broccoli. He last saw Dr. Earlean Shawl over 6 months ago.  He has had esophageal dilation in the past, which helped. He has been prescribed an inhaled steroid (currently Flovent), directed to SWALLOW, to help with esophageal symptoms.He reports the cost recently increased significantly with change in insurance, but has been using it regularly up until 2 days ago when he ran out.   Immunization History  Administered Date(s) Administered  . Influenza Whole 01/05/2008  . Influenza,inj,Quad PF,36+ Mos 02/01/2013, 05/28/2015  . Pneumococcal Polysaccharide-23 06/01/2007  . Td 06/20/1997  . Tdap 01/05/2008  . Zoster 02/25/2012   Last colonoscopy: 02/2014; pt states told 10 years Last PSA: 06/2014 Dentist: twice yearly  Ophtho: yearly Exercise: daily (elliptical, bike). Tennis, golf, yardwork. Exercises daily. Lipids: Lab Results  Component Value Date   CHOL 194 06/28/2014   HDL 82 06/28/2014   LDLCALC 102* 06/28/2014   TRIG 49 06/28/2014   CHOLHDL 2.4 06/28/2014  Vitamin D was low at 27 last  year.  Past Medical History  Diagnosis Date  . HH (hiatus hernia)   . Esophageal stricture     Dr. Earlean Shawl  . Actinic keratosis     Dr. Tonia Brooms  . Arthritis     wrists, L thumb  . Benign hematuria   . Hyponatremia     127-134 range  . Mitral valve prolapse     ruled out per patient by echo  . Polio 1956  . BCC (basal cell carcinoma of skin) 05/2011    chest  . Microscopic hematuria 11/2011    negative w/u per Dr. Jasmine December (CT, cystoscopy)  . Asthma     since childhood    Past Surgical History  Procedure Laterality Date  . Total knee arthroplasty  11/07    RIGHT (Aluisio)  . Ankle fusion  1997    LEFT  . Wrist surgery      right (Dr. Burney Gauze)  . Ulnar nerve surgery      Right (Dr. Burney Gauze)  . Cardiovascular stress test  2006    normal--Dr. Percival Spanish  . Esophagogastroduodenoscopy  2009    Dr. Earlean Shawl  . Colonoscopy  2009    due again 2014  . Esophageal dilation  07/2010, 03/2011  . Band hemorrhoidectomy      Dr. Earlean Shawl  . Cystoscopy  11/2011    normal; Dr. Jasmine December  . Colonoscopy, esophagogastroduodenoscopy (egd) and esophageal dilation  02/15/2014    Dr.Medoff    Social History   Social History  . Marital Status: Married    Spouse Name:  N/A  . Number of Children: 1  . Years of Education: N/A   Occupational History  . 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 to [employer]   Social History Main Topics  . Smoking status: Never Smoker   . Smokeless tobacco: Never Used  . Alcohol Use: Yes     Comment: couple beers after working out, daily  . Drug Use: No  . Sexual Activity: Not Currently   Other Topics Concern  . Not on file   Social History Narrative   Lives with wife, 1 cat.  Son from a previous marriage lives in [place].  1 granddaughter. Cut back to working part-time.    Family History  Problem Relation Age of Onset  . Alcohol abuse Mother   . Cancer Mother     male cancer in 72's  . Heart disease Father 17    MI  . Heart disease Brother 10    MI  .  Glaucoma Brother   . Diabetes Neg Hx   . Colon cancer Neg Hx   . Heart disease Brother 48    stents    Outpatient Encounter Prescriptions as of 07/04/2015  Medication Sig Note  . co-enzyme Q-10 30 MG capsule Take 30 mg by mouth daily. 05/03/2014: Takes occasionally  . fish oil-omega-3 fatty acids 1000 MG capsule Take 1 g by mouth daily.    . Pyridoxine HCl (VITAMIN B-6) 500 MG tablet Take 500 mg by mouth daily.     . vitamin B-12 (CYANOCOBALAMIN) 1000 MCG tablet Take 1,000 mcg by mouth daily.   . vitamin C (ASCORBIC ACID) 500 MG tablet Take 500 mg by mouth daily.   Marland Kitchen albuterol (PROVENTIL HFA;VENTOLIN HFA) 108 (90 BASE) MCG/ACT inhaler Inhale 2 puffs into the lungs every 6 (six) hours as needed for wheezing. Use 30 mins prior to exercise (Patient not taking: Reported on 07/04/2015)   . sildenafil (REVATIO) 20 MG tablet Take up to 5 tablets at once, daily if needed for erectile dysfunction    No facility-administered encounter medications on file as of 07/04/2015.    No Known Allergies  ROS: The patient denies anorexia, fever, headaches, vision loss, decreased hearing, ear pain, hoarseness, chest pain, palpitations, dizziness, syncope, dyspnea on exertion, swelling, nausea, vomiting, diarrhea, constipation, abdominal pain, melena, hematochezia, indigestion/heartburn, hematuria, incontinence, weakened urine stream, dysuria, weakness, tremor, suspicious skin lesions (sees derm, Dr. Tonia Brooms, regularly), depression, anxiety, abnormal bleeding/bruising, or enlarged lymph nodes  H/o microscopic hematuria--no visible blood per pt  No rectal bleeding since getting hemorrhoidal banding  Occasional tingling R hand from ulnar nerve issue (chronic)--intermittent, overall doing better. Thinks the B6 he takes helps. Worse in cold weather. No weakness. Occasional L thumb pain if uses screwdriver a lot (known arthritis), occasional bilateral wrist pain. Currently denies pain. Recently had acute swelling of  his left thumb and wrist--used Aleve and salonpas and resolved (occurred after very active day, golf). +recurrent dysphagia as per HPI. Voids frequently due to caffeine and fluid intake. Up 2-3x at night to void (2 beers in the evening). Not in a sexual relationship due to wife's health issues.  Some ringing in his ears, and plugging/popping after flying.  PHYSICAL EXAM:  BP 140/90 mmHg  Pulse 68  Ht 5' 8.25" (1.734 m)  Wt 163 lb 6.4 oz (74.118 kg)  BMI 24.65 kg/m2 BP 148/78 on repeat by MD.  Very excitable today  General Appearance:  Alert, cooperative, no distress, appears stated age   Head:  Normocephalic, without obvious abnormality, atraumatic  Eyes:  PERRL, conjunctiva/corneas clear, EOM's intact, fundi  benign   Ears:  Normal TM's and external ear canals   Nose:  Nares normal, no erythema or purulence, no sinus tenderness   Throat:  Lips, mucosa, and tongue normal; teeth and gums normal.   Neck:  Supple, no lymphadenopathy; thyroid: no enlargement/tenderness/nodules; no carotid  bruit or JVD   Back:  Spine nontender, no curvature, ROM normal, no CVA tenderness   Lungs:  Clear to auscultation bilaterally without wheezes, rales or ronchi; respirations unlabored   Chest Wall:  No tenderness or deformity   Heart:  Regular rate and rhythm, S1 and S2 normal, no murmur, rub  or gallop   Breast Exam:  No chest wall tenderness, masses or gynecomastia   Abdomen:  Soft, non-tender, nondistended, normoactive bowel sounds,  no masses, no hepatosplenomegaly   Genitalia:  Normal male external genitalia without lesions. There is slight curvature of the distal portion of the penis. Testicles without masses. No inguinal hernias.   Rectal:  Normal sphincter tone, no masses or tenderness; guaiac negative stool. Prostate smooth, no nodules, only slightly enlarged. Small hemorrhoidal tag externally on the left posterior  Extremities:   No clubbing, cyanosis or edema. +leg length discrepancy--left is shorter than right leg (since L ankle fusion per pt). L ankle fused; WHSS R knee. Subungual hemorrhage right great toenail.  Thickening and onycholytic changes to many nails.  Pulses:  2+ and symmetric all extremities   Skin:  Skin color, texture, turgor normal, no rashes. Actinic damage throughout, with AK's on hands bilaterally  Lymph nodes:  Cervical, supraclavicular, and axillary nodes normal   Neurologic:  CNII-XII intact, normal strength, sensation and gait; reflexes 2+ and symmetric throughout (absent at both knees)  Psych: Normal mood, affect, hygiene and grooming              ASSESSMENT/PLAN:   Annual physical exam - Plan: POCT Urinalysis Dipstick, Comprehensive metabolic panel, PSA, VITAMIN D 25 Hydroxy (Vit-D Deficiency, Fractures)  Eosinophilic esophagitis - talk with Dr. Earlean Shawl re: meds/costs. ?Flonase as less expensive alternative toinhaled steroids?. f/u re: dysphagia, poss dilation needed again  Exercise-induced asthma - normal spirometry. albuterol prn - Plan: Spirometry with graph  Hyponatremia - Plan: Comprehensive metabolic panel  Screening for prostate cancer - Plan: PSA  Vitamin D deficiency - Plan: VITAMIN D 25 Hydroxy (Vit-D Deficiency, Fractures)  Immunization due - Plan: Pneumococcal conjugate vaccine 13-valent  Erectile dysfunction, unspecified erectile dysfunction type - no samples available. requesting generic sildenafil from [facility] drug - Plan: sildenafil (REVATIO) 20 MG tablet  Elevated blood pressure - counseled re: low sodium diet. monitor elsewhere and f/u in 1 month with list of BP's   c-met, PSA, Vit D Can hold off on lipids, TSH, CBC, all normal last year.  Elevated BP--low sodium diet, monitor elsewhere. F/u in 1 month with list   Discussed PSA screening (risks/benefits), recommended at least 30 minutes of aerobic activity at least 5 days/week,  weight-bearing exercise at least 2x/wk; proper sunscreen use reviewed; healthy diet and alcohol recommendations (less than or equal to 2 drinks/day) reviewed; regular seatbelt use; changing batteries in smoke detectors. Self-testicular exams. Immunization recommendations discussed- Prevnar 13 today; continue yearly flu shots. Pneumovax next year. Colonoscopy recommendations reviewed--UTD.  F/u 1 month on BP, with list

## 2015-07-05 LAB — VITAMIN D 25 HYDROXY (VIT D DEFICIENCY, FRACTURES): Vit D, 25-Hydroxy: 33 ng/mL (ref 30–100)

## 2015-07-05 LAB — PSA: PSA: 0.85 ng/mL (ref <=4.00)

## 2015-08-21 ENCOUNTER — Ambulatory Visit: Payer: BLUE CROSS/BLUE SHIELD | Admitting: Family Medicine

## 2015-09-03 ENCOUNTER — Encounter (HOSPITAL_COMMUNITY): Payer: Self-pay

## 2015-09-23 DIAGNOSIS — Z85828 Personal history of other malignant neoplasm of skin: Secondary | ICD-10-CM | POA: Diagnosis not present

## 2015-09-23 DIAGNOSIS — D485 Neoplasm of uncertain behavior of skin: Secondary | ICD-10-CM | POA: Diagnosis not present

## 2015-09-23 DIAGNOSIS — L570 Actinic keratosis: Secondary | ICD-10-CM | POA: Diagnosis not present

## 2015-09-23 DIAGNOSIS — C44519 Basal cell carcinoma of skin of other part of trunk: Secondary | ICD-10-CM | POA: Diagnosis not present

## 2015-09-23 DIAGNOSIS — D0439 Carcinoma in situ of skin of other parts of face: Secondary | ICD-10-CM | POA: Diagnosis not present

## 2015-09-23 DIAGNOSIS — L821 Other seborrheic keratosis: Secondary | ICD-10-CM | POA: Diagnosis not present

## 2015-09-27 ENCOUNTER — Encounter: Payer: Self-pay | Admitting: Family Medicine

## 2015-10-16 DIAGNOSIS — C44519 Basal cell carcinoma of skin of other part of trunk: Secondary | ICD-10-CM | POA: Diagnosis not present

## 2015-10-16 DIAGNOSIS — L570 Actinic keratosis: Secondary | ICD-10-CM | POA: Diagnosis not present

## 2015-10-16 DIAGNOSIS — C44329 Squamous cell carcinoma of skin of other parts of face: Secondary | ICD-10-CM | POA: Diagnosis not present

## 2015-11-28 DIAGNOSIS — N401 Enlarged prostate with lower urinary tract symptoms: Secondary | ICD-10-CM | POA: Diagnosis not present

## 2015-11-28 DIAGNOSIS — R3912 Poor urinary stream: Secondary | ICD-10-CM | POA: Diagnosis not present

## 2015-12-19 DIAGNOSIS — R351 Nocturia: Secondary | ICD-10-CM | POA: Diagnosis not present

## 2015-12-19 DIAGNOSIS — N401 Enlarged prostate with lower urinary tract symptoms: Secondary | ICD-10-CM | POA: Diagnosis not present

## 2016-01-17 ENCOUNTER — Encounter: Payer: Self-pay | Admitting: Family Medicine

## 2016-01-17 ENCOUNTER — Ambulatory Visit (INDEPENDENT_AMBULATORY_CARE_PROVIDER_SITE_OTHER): Payer: BLUE CROSS/BLUE SHIELD | Admitting: Family Medicine

## 2016-01-17 VITALS — BP 142/82 | HR 55 | Temp 98.1°F | Wt 169.8 lb

## 2016-01-17 DIAGNOSIS — R05 Cough: Secondary | ICD-10-CM | POA: Diagnosis not present

## 2016-01-17 DIAGNOSIS — R059 Cough, unspecified: Secondary | ICD-10-CM

## 2016-01-17 DIAGNOSIS — J069 Acute upper respiratory infection, unspecified: Secondary | ICD-10-CM

## 2016-01-17 MED ORDER — HYDROCODONE-HOMATROPINE 5-1.5 MG/5ML PO SYRP: 5.0000 mL | ORAL_SOLUTION | Freq: Four times a day (QID) | ORAL | 0 refills | Status: DC | PRN

## 2016-01-17 MED ORDER — AMOXICILLIN 875 MG PO TABS: 875.0000 mg | ORAL_TABLET | Freq: Two times a day (BID) | ORAL | 0 refills | Status: DC

## 2016-01-17 NOTE — Progress Notes (Signed)
Subjective: Chief Complaint  Patient presents with  . cold    deep cough, congestion- dark brownish,greenish grayish color, hoarse, sinus pressure, dull headache     Wayne Brandt is a 65 y.o. male who presents for a 1 week history of cough that is productive of dark brownish sputum. Also reports sinus pressure, congestion, mild sore throat and hoarseness.  Feels feverish but no documented temperature. Denies ear pain, chest pain, palpations, DOE, abdominal pain, N/V/D.   States he is still playing tennis but not feeling well.   Treatment to date: hydrocodone cough syrup, expired over a year ago and requests a new prescription.   Positive sick contacts, his wife has also been sick and is on antibiotics.  No other aggravating or relieving factors.  No other c/o.  ROS as in subjective.   Objective: Vitals:   01/17/16 1519  BP: (!) 142/82  Pulse: (!) 55  Temp: 98.1 F (36.7 C)    General appearance: Alert, WD/WN, no distress, mildly ill appearing                             Skin: warm, no rash                           Head: no sinus tenderness                            Eyes: conjunctiva normal, corneas clear, PERRLA                            Ears: pearly TMs, external ear canals normal                          Nose: septum midline, turbinates swollen, with erythema and clear discharge             Mouth/throat: MMM, tongue normal, mild pharyngeal erythema                           Neck: supple, no adenopathy, no thyromegaly, nontender                          Heart: RRR, normal S1, S2, no murmurs                         Lungs: CTA bilaterally, no wheezes, rales, or rhonchi     Assessment: Acute URI  Cough  Plan: Discussed diagnosis and treatment of URI and bronchitis and possible sinusitis. Amoxicillin sent to pharmacy and prescription for Hycodan given to patient.  Suggested symptomatic OTC remedies. Stay well hydrated, salt water gargles, Nasal saline spray for  congestion.  Tylenol or Ibuprofen OTC for fever and malaise. He will follow up if not back to baseline after completing the antibiotic or if he is not improving in 2-3 days.

## 2016-02-06 ENCOUNTER — Ambulatory Visit (INDEPENDENT_AMBULATORY_CARE_PROVIDER_SITE_OTHER): Payer: BLUE CROSS/BLUE SHIELD | Admitting: Family Medicine

## 2016-02-06 ENCOUNTER — Encounter: Payer: Self-pay | Admitting: Family Medicine

## 2016-02-06 VITALS — BP 138/70 | HR 56 | Temp 97.8°F | Ht 68.0 in | Wt 166.4 lb

## 2016-02-06 DIAGNOSIS — S91111A Laceration without foreign body of right great toe without damage to nail, initial encounter: Secondary | ICD-10-CM

## 2016-02-06 DIAGNOSIS — R05 Cough: Secondary | ICD-10-CM | POA: Diagnosis not present

## 2016-02-06 DIAGNOSIS — Z23 Encounter for immunization: Secondary | ICD-10-CM

## 2016-02-06 DIAGNOSIS — R059 Cough, unspecified: Secondary | ICD-10-CM

## 2016-02-06 NOTE — Patient Instructions (Addendum)
   Avoid spicy foods, alcohol, acidic foods such as citrus, tomatoes. Consider trying to take prilosec OTC once daily for a week or two. If any of the cough (related to eating/alcohol) is related to reflux, then this should help prevent the cough. You likely also have a component of reactive airway disease (wheezing). Use the Pro-Air prior to exercise and as needed for spells of dry cough. Try taking guaifenesin (found in Mucinex--either the plain or the DM version)--this is an expectorant that helps thin out any thick mucus.  If symptoms continue, the next step would be either a trial of tessalon/benzonatate (cough medication) or a short course of steroids.   We treated the area of bleeding with silver nitrate. Keep it covered to prevent further trauma for the next 24 hours. You should then be fine and not worry about any significant bleeding.  Direct pressure should resolve any recurrent bleeding--hold for at least 10 minutes at a time without any peeking/dabbing.

## 2016-02-06 NOTE — Progress Notes (Signed)
Chief Complaint  Patient presents with  . Laceration    right big toe, was cutting toenail last night and cut himself. Has been bleeding since last night.   . Follow-up    saw Vickie on 10/6 for URI-still coughing some.    He cut the skin of his right great toe while cutting his nails last night (and not wearing his glasses).  He tried pressure, ice, elevation.  Wife checked it this morning and is bleeding less, but still bleeding some.  Denies significant discomfort. He takes 2 Aleve about once a week, last taken on Saturday (5 days ago)  He has a cough after a recent cold.  He often gets this cough in the Fall.  It is worse after exercising (after elliptical) for a few minutes.  Cold drinks, eating and talking also trigger cough.  Cough is dry.  He uses hydrocodone syrup sparingly. Drinking beer also made it worse.  He has ProAir at home--used it when cough was worse.  Hasn't used it in the last few days.   PMH, [place], SH reviewed  Outpatient Encounter Prescriptions as of 02/06/2016  Medication Sig Note  . co-enzyme Q-10 30 MG capsule Take 30 mg by mouth daily. 05/03/2014: Takes occasionally  . fish oil-omega-3 fatty acids 1000 MG capsule Take 1 g by mouth daily.    . Pyridoxine HCl (VITAMIN B-6) 500 MG tablet Take 500 mg by mouth daily.     . sildenafil (REVATIO) 20 MG tablet Take up to 5 tablets at once, daily if needed for erectile dysfunction   . solifenacin (VESICARE) 10 MG tablet Take 10 mg by mouth daily.   . vitamin B-12 (CYANOCOBALAMIN) 1000 MCG tablet Take 1,000 mcg by mouth daily.   . vitamin C (ASCORBIC ACID) 500 MG tablet Take 500 mg by mouth daily.   Marland Kitchen albuterol (PROVENTIL HFA;VENTOLIN HFA) 108 (90 BASE) MCG/ACT inhaler Inhale 2 puffs into the lungs every 6 (six) hours as needed for wheezing. Use 30 mins prior to exercise (Patient not taking: Reported on 02/06/2016)   . HYDROcodone-homatropine (HYCODAN) 5-1.5 MG/5ML syrup Take 5 mLs by mouth every 6 (six) hours as needed for  cough. (Patient not taking: Reported on 02/06/2016) 02/06/2016: Using sparingly/infrequent  . [DISCONTINUED] amoxicillin (AMOXIL) 875 MG tablet Take 1 tablet (875 mg total) by mouth 2 (two) times daily.    No facility-administered encounter medications on file as of 02/06/2016.    Uses aleve prn, as per HPI  No Known Allergies  ROS: no fever, chills, headaches, dizziness, shortness of breath, fatigue.  +cough as per HPI.  +ongoing bleeding from toe after injury. No other bleeding, bruising. No GI complaints or other concerns  PHYSICAL EXAM:  BP 138/70 (BP Location: Right Arm, Patient Position: Sitting, Cuff Size: Normal)   Pulse (!) 56   Temp 97.8 F (36.6 C) (Tympanic)   Ht 5\' 8"  (1.727 m)   Wt 166 lb 6.4 oz (75.5 kg)   BMI 25.30 kg/m   Well developed, talkative, pleasant male in no distress HEENT: PERRL, EOMI, conjunctiva and sclera are clear.  OP is clear, no erythema or lesions. Nasal mucosa is mildly edematous without erythema or drainage.  Sinuses nontender Neck: no lymphadenopathy, thyromegaly or mass Heart: regular rate and rhythm Lungs: clear bilaterally.  Slight dry cough after taking deep breaths.  No wheezes, rales or ronchi noted. Extremities: Right great toe--at lateral aspect of the tip of the toe (not adjacent to the nail, but more distal) there is  a 5 x 1.8mm very superficial abrasion that is oozing just slightly from superficial capillary. Silver nitrate stick was applied with good results. Wound was dressed with bacitracin and nonstick gauze.  ASSESSMENT/PLAN:   Laceration of right great toe without foreign body present or damage to nail, initial encounter - silver nitrate applied with good results.    Need for prophylactic vaccination and inoculation against influenza - Plan: Flu vaccine HIGH DOSE PF (Fluzone High dose)  Cough - Ddx reviewed including RAD/wheezing, PND, GERD, and need to treat all   Avoid spicy foods, alcohol, acidic foods such as  citrus, tomatoes. Consider trying to take prilosec OTC once daily for a week or two. If any of the cough (related to eating/alcohol) is related to reflux, then this should help prevent the cough. You likely also have a component of reactive airway disease (wheezing). Use the Pro-Air prior to exercise and as needed for spells of dry cough. Try taking guaifenesin (found in Mucinex--either the plain or the DM version)--this is an expectorant that helps thin out any thick mucus.  If symptoms continue, the next step would be either a trial of tessalon/benzonatate (cough medication) or a short course of steroids.

## 2016-02-19 ENCOUNTER — Ambulatory Visit (INDEPENDENT_AMBULATORY_CARE_PROVIDER_SITE_OTHER): Payer: BLUE CROSS/BLUE SHIELD | Admitting: Family Medicine

## 2016-02-19 ENCOUNTER — Encounter: Payer: Self-pay | Admitting: Family Medicine

## 2016-02-19 VITALS — BP 128/76 | HR 60 | Temp 98.7°F | Ht 68.0 in | Wt 165.8 lb

## 2016-02-19 DIAGNOSIS — R059 Cough, unspecified: Secondary | ICD-10-CM

## 2016-02-19 DIAGNOSIS — J45990 Exercise induced bronchospasm: Secondary | ICD-10-CM | POA: Diagnosis not present

## 2016-02-19 DIAGNOSIS — R05 Cough: Secondary | ICD-10-CM

## 2016-02-19 MED ORDER — AZITHROMYCIN 250 MG PO TABS: ORAL_TABLET | ORAL | 0 refills | Status: DC

## 2016-02-19 MED ORDER — PREDNISONE 20 MG PO TABS: 20.0000 mg | ORAL_TABLET | Freq: Two times a day (BID) | ORAL | 0 refills | Status: DC

## 2016-02-19 NOTE — Progress Notes (Signed)
Chief Complaint  Patient presents with  . Cough    still has cough, non productive. Sat morning he "hocked up loogie" that was dark green with blood in it. Thinks he has drainage that drains down, when he eats it worse. No fevers.    11/4 he coughed up a dark green/grayish phlegm with a slight bit of blood.  He felt much better after coughing this up.  Felt better for a while, but then cough recurred.   He has been coughing for over a month (initial visit was with Vickie 10/6).  He was treated with amoxicillin. He never got completely better.  He reports getting sick like this every year around this time of year.  Cough recurs with exercise (on the bike at the gym--coughs slightly, but coughs a lot after he finishes, after pushing himself hard for the last 5 minutes).  He sometimes coughs up just a small thin mucus, if anything.   He seems to cough more with eating--it "loosens it up", makes him cough more, especially with warm foods.  Coughs a lot in the mornings.  Talking to people makes the cough worse also--like a tickle in his throat.  He tried albuterol prior to exercise, but it doesn't seem to decrease the cough at all.  Never tried the prilosec as recommended "I don't think it is reflux".  He tried mucinex--taking just 2 in the morning, none in the evening.  No coughing when playing golf when he didn't drink anything.  PMH, [place], SH reviewed  Outpatient Encounter Prescriptions as of 02/19/2016  Medication Sig Note  . albuterol (PROVENTIL HFA;VENTOLIN HFA) 108 (90 BASE) MCG/ACT inhaler Inhale 2 puffs into the lungs every 6 (six) hours as needed for wheezing. Use 30 mins prior to exercise   . co-enzyme Q-10 30 MG capsule Take 30 mg by mouth daily. 05/03/2014: Takes occasionally  . fish oil-omega-3 fatty acids 1000 MG capsule Take 1 g by mouth daily.    . Pyridoxine HCl (VITAMIN B-6) 500 MG tablet Take 500 mg by mouth daily.     . sildenafil (REVATIO) 20 MG tablet Take up to 5 tablets at once,  daily if needed for erectile dysfunction   . silodosin (RAPAFLO) 8 MG CAPS capsule Take 8 mg by mouth daily with breakfast.   . solifenacin (VESICARE) 10 MG tablet Take 10 mg by mouth daily.   . vitamin B-12 (CYANOCOBALAMIN) 1000 MCG tablet Take 1,000 mcg by mouth daily.   . vitamin C (ASCORBIC ACID) 500 MG tablet Take 500 mg by mouth daily.   Marland Kitchen HYDROcodone-homatropine (HYCODAN) 5-1.5 MG/5ML syrup Take 5 mLs by mouth every 6 (six) hours as needed for cough. (Patient not taking: Reported on 02/19/2016) 02/06/2016: Using sparingly/infrequent   No facility-administered encounter medications on file as of 02/19/2016.    Taking vesicare and rapaflo from urologist.  No Known Allergies  ROS: no fever, chills, headache, dizziness, chest pain.  +cough as per HPI.  No nausea, vomiting, heartburn, bowel changes, rash or other complaints.  PHYSICAL EXAM:  BP 128/76 (BP Location: Left Arm, Patient Position: Sitting, Cuff Size: Normal)   Pulse 60   Temp 98.7 F (37.1 C)   Ht 5\' 8"  (1.727 m)   Wt 165 lb 12.8 oz (75.2 kg)   BMI 25.21 kg/m   Well developed, well-appearing male with occasional dry cough, especially after taking deep breaths.  HEENT: PERRL, EOMI, conjunctiva and sclera are clear.  Nasal mucosa is mildly edematous,  No erythema.  Some yellow crusting noted on the left. Sinuses nontender. OP is clear No lymphadenopathy or mass Heart: regular rate and rhythm without murmur Lungs clear bilaterally. No wheezes, rales, ronchi Some coughing with deep breaths--dry, nonproductive Neuro: alert and oriented, cranial nerves intact, normal strength, gait Psych: normal mood, affect, hygiene and grooming Extremities: no edema  ASSESSMENT/PLAN:  Cough - x 1 month, now productive of discolored phlegm. Ddx includes bacterial infxn, RAD, reflux, allergies. Needs CXR if not resolving - Plan: azithromycin (ZITHROMAX) 250 MG tablet, predniSONE (DELTASONE) 20 MG tablet  Exercise-induced asthma -  continue albuterol prior to exercise, and prn  Needs CXR if persistent cough despite the ABX and prednisone. Given recurrence this time of year, mostly clear mucus, may have component of allergies. Consider Flonase if cough recurs after treatment. Consider tessalon to help with cough if needed.  Risks/side effects of ABX and steroids reviewed in detail. Ddx reviewed. Call next week if not better.   Drink plenty of fluids. Continue the mucinex --change to twice daily Take the z-pak as directed.  Remember this stays working for 10 days even though you only take it for 5. Take the prednisone twice daily for 5 days.  If you have terrible side effects, you can taper down instead (20mg  twice daily, but if not tolerated, change to once daily). If your cough does not get better after the steroid and antibiotic, you need to call us to put in an order for a chest x-ray.  Call in 7-10 days if not improving, sooner if worsening.  If you feel better from the medications, but the clear mucus and dry cough return, then try using Flonase.  It could be that the prednisone was treating some allergies, and the Flonase might help with that as well.

## 2016-02-19 NOTE — Patient Instructions (Signed)
  Drink plenty of fluids. Continue the mucinex --change to twice daily Take the z-pak as directed.  Remember this stays working for 10 days even though you only take it for 5. Take the prednisone twice daily for 5 days.  If you have terrible side effects, you can taper down instead (20mg  twice daily, but if not tolerated, change to once daily). If your cough does not get better after the steroid and antibiotic, you need to call us to put in an order for a chest x-ray.  Call in 7-10 days if not improving, sooner if worsening.  If you feel better from the medications, but the clear mucus and dry cough return, then try using Flonase.  It could be that the prednisone was treating some allergies, and the Flonase might help with that as well.

## 2016-02-21 ENCOUNTER — Ambulatory Visit
Admission: RE | Admit: 2016-02-21 | Discharge: 2016-02-21 | Disposition: A | Discharge status: Home / Self Care | Payer: BLUE CROSS/BLUE SHIELD | Source: Ambulatory Visit | Attending: Family Medicine | Admitting: Family Medicine

## 2016-02-21 ENCOUNTER — Telehealth: Payer: Self-pay | Admitting: Family Medicine

## 2016-02-21 DIAGNOSIS — R05 Cough: Secondary | ICD-10-CM

## 2016-02-21 DIAGNOSIS — R059 Cough, unspecified: Secondary | ICD-10-CM

## 2016-02-21 NOTE — Telephone Encounter (Signed)
Pt wants to know if he can go ahead get a chest xray. He said Dr Tomi Bamberger mentioned that chest xray may be a next step & since pt still has cough and this has lingered for a long time he would like to go ahead get chest xray now.

## 2016-02-21 NOTE — Telephone Encounter (Signed)
Called and advised pt of Dr Johnsie Kindred instructions

## 2016-02-21 NOTE — Telephone Encounter (Signed)
Order entered--advise pt to go to [facility]

## 2016-03-10 ENCOUNTER — Ambulatory Visit (INDEPENDENT_AMBULATORY_CARE_PROVIDER_SITE_OTHER): Payer: BLUE CROSS/BLUE SHIELD | Admitting: Family Medicine

## 2016-03-10 ENCOUNTER — Encounter: Payer: Self-pay | Admitting: Family Medicine

## 2016-03-10 VITALS — BP 132/86 | HR 51 | Wt 165.0 lb

## 2016-03-10 DIAGNOSIS — I10 Essential (primary) hypertension: Secondary | ICD-10-CM | POA: Diagnosis not present

## 2016-03-10 DIAGNOSIS — Z8249 Family history of ischemic heart disease and other diseases of the circulatory system: Secondary | ICD-10-CM

## 2016-03-10 MED ORDER — LOSARTAN POTASSIUM 50 MG PO TABS: 50.0000 mg | ORAL_TABLET | Freq: Every day | ORAL | 3 refills | Status: DC

## 2016-03-10 NOTE — Progress Notes (Signed)
   Subjective:    Patient ID: EVONTE NEEB, male    DOB: 05-14-1950, 65 y.o.   MRN: [id number]  HPI He is here for consult concerning his blood pressure. He apparently took it earlier today and noted a reading of 180/100. This was at a drugstore. He has concerns on whether he needs to be treated. He also states that his cough is getting better and he is roughly 80% better but still having some slight coughing. He also states that there is a strong family history of heart disease with a father and 2 brothers having heart attacks before age 71.  Review of Systems     Objective:   Physical Exam Alert and in no distress. Blood pressure is recorded. After a discussion with him I again took it and it actually went up to a systolic of in the [id number] range.       Assessment & Plan:  Essential hypertension - Plan: losartan (COZAAR) 50 MG tablet  Family history of heart disease in male family member before age 75 Review of his recent blood pressure readings especially in lieu of the new blood pressure criteria, I will place him on losartan and avoid an Ace due to his cough. Discussed proper care and the fact that he needs to check his blood pressure resting sitting position with arm at heart level. Mentioned him buying his own blood pressure cuff and bring it in to compare with our blood pressure cuff. He is to return here in one month for a recheck.

## 2016-03-11 DIAGNOSIS — N401 Enlarged prostate with lower urinary tract symptoms: Secondary | ICD-10-CM | POA: Diagnosis not present

## 2016-03-11 DIAGNOSIS — R3912 Poor urinary stream: Secondary | ICD-10-CM | POA: Diagnosis not present

## 2016-03-18 DIAGNOSIS — R351 Nocturia: Secondary | ICD-10-CM | POA: Diagnosis not present

## 2016-03-18 DIAGNOSIS — N401 Enlarged prostate with lower urinary tract symptoms: Secondary | ICD-10-CM | POA: Diagnosis not present

## 2016-03-24 DIAGNOSIS — L570 Actinic keratosis: Secondary | ICD-10-CM | POA: Diagnosis not present

## 2016-03-26 DIAGNOSIS — R351 Nocturia: Secondary | ICD-10-CM | POA: Diagnosis not present

## 2016-03-26 DIAGNOSIS — R8271 Bacteriuria: Secondary | ICD-10-CM | POA: Diagnosis not present

## 2016-03-26 DIAGNOSIS — N401 Enlarged prostate with lower urinary tract symptoms: Secondary | ICD-10-CM | POA: Diagnosis not present

## 2016-03-26 DIAGNOSIS — N1371 Vesicoureteral-reflux without reflux nephropathy: Secondary | ICD-10-CM | POA: Diagnosis not present

## 2016-03-26 DIAGNOSIS — R338 Other retention of urine: Secondary | ICD-10-CM | POA: Diagnosis not present

## 2016-03-27 DIAGNOSIS — R3914 Feeling of incomplete bladder emptying: Secondary | ICD-10-CM | POA: Diagnosis not present

## 2016-03-27 DIAGNOSIS — R351 Nocturia: Secondary | ICD-10-CM | POA: Diagnosis not present

## 2016-04-09 ENCOUNTER — Ambulatory Visit (INDEPENDENT_AMBULATORY_CARE_PROVIDER_SITE_OTHER): Payer: BLUE CROSS/BLUE SHIELD | Admitting: Family Medicine

## 2016-04-09 ENCOUNTER — Encounter: Payer: Self-pay | Admitting: Family Medicine

## 2016-04-09 VITALS — BP 120/80 | HR 53 | Ht 68.0 in | Wt 163.0 lb

## 2016-04-09 DIAGNOSIS — J209 Acute bronchitis, unspecified: Secondary | ICD-10-CM

## 2016-04-09 DIAGNOSIS — I10 Essential (primary) hypertension: Secondary | ICD-10-CM | POA: Diagnosis not present

## 2016-04-09 DIAGNOSIS — Z8249 Family history of ischemic heart disease and other diseases of the circulatory system: Secondary | ICD-10-CM | POA: Diagnosis not present

## 2016-04-09 DIAGNOSIS — J45990 Exercise induced bronchospasm: Secondary | ICD-10-CM | POA: Diagnosis not present

## 2016-04-09 DIAGNOSIS — R05 Cough: Secondary | ICD-10-CM | POA: Diagnosis not present

## 2016-04-09 DIAGNOSIS — I700 Atherosclerosis of aorta: Secondary | ICD-10-CM

## 2016-04-09 DIAGNOSIS — J309 Allergic rhinitis, unspecified: Secondary | ICD-10-CM

## 2016-04-09 DIAGNOSIS — R059 Cough, unspecified: Secondary | ICD-10-CM

## 2016-04-09 MED ORDER — AMOXICILLIN-POT CLAVULANATE 875-125 MG PO TABS: 1.0000 | ORAL_TABLET | Freq: Two times a day (BID) | ORAL | 0 refills | Status: DC

## 2016-04-09 NOTE — Progress Notes (Signed)
   Subjective:    Patient ID: Wayne Brandt, male    DOB: [birth year], 65 y.o.   MRN: [id number]  HPI He is here for recheck on his blood pressure. He is now taking losartan and having no difficulty with this. He does complain of continued difficulty with a cough that is been going on for 3 months. He also notes some slight rhinorrhea as well as PND. His have underlying exercise-induced asthma but states the cough does not really improve when he uses in his inhaler. He did state that antibiotics did make him better but did not take away the coughing completely. He also has an underlying history of allergies. He did have recent x-rays which did show aortic atherosclerosis. There is a family history of heart disease. He is also been evaluated by cardiology.   Review of Systems     Objective:   Physical Exam Alert and in no distress. Tympanic membranes and canals are normal. Pharyngeal area is normal. Neck is supple without adenopathy or thyromegaly. Cardiac exam shows a regular sinus rhythm without murmurs or gallops. Lungs show slight wheezing with forced expiration.        Assessment & Plan:  Family history of heart disease in male family member before age 41  Aortic atherosclerosis ([place])  Cough  Exercise-induced asthma  Acute bronchitis, unspecified organism - Plan: amoxicillin-clavulanate (AUGMENTIN) 875-125 MG tablet  Allergic rhinitis, unspecified chronicity, unspecified seasonality, unspecified trigger  Essential hypertension His blood pressure today looks good. His symptoms do sound as if he could still be having an unresolved bronchitis and possibly even sinusitis. I explained that this is complicated due to his underlying history of allergies/asthma. I will place him on Augmentin. He will call me in 2 weeks to let me know how he is doing. Explained that getting him better is not my goal was to get him back to normal. Then also discussed the aortic atherosclerosis. I  explained that this was part of the whole risk of heart disease. His last LDL was in the low 100s. At this point no further intervention is needed. He was comfortable with that.

## 2016-04-27 ENCOUNTER — Encounter: Payer: Self-pay | Admitting: Family Medicine

## 2016-05-06 ENCOUNTER — Encounter: Payer: Self-pay | Admitting: Family Medicine

## 2016-05-06 DIAGNOSIS — J209 Acute bronchitis, unspecified: Secondary | ICD-10-CM

## 2016-05-06 MED ORDER — AMOXICILLIN-POT CLAVULANATE 875-125 MG PO TABS: 1.0000 | ORAL_TABLET | Freq: Two times a day (BID) | ORAL | 0 refills | Status: DC

## 2016-05-06 NOTE — Telephone Encounter (Signed)
He is still having difficulty with the cough. I will place him on another course of Augmentin and see if this will clear it up completely.

## 2016-05-20 DIAGNOSIS — L570 Actinic keratosis: Secondary | ICD-10-CM | POA: Diagnosis not present

## 2016-05-21 DIAGNOSIS — R351 Nocturia: Secondary | ICD-10-CM | POA: Diagnosis not present

## 2016-05-21 DIAGNOSIS — R338 Other retention of urine: Secondary | ICD-10-CM | POA: Diagnosis not present

## 2016-05-21 DIAGNOSIS — N401 Enlarged prostate with lower urinary tract symptoms: Secondary | ICD-10-CM | POA: Diagnosis not present

## 2016-05-28 DIAGNOSIS — R330 Drug induced retention of urine: Secondary | ICD-10-CM | POA: Diagnosis not present

## 2016-05-28 DIAGNOSIS — N401 Enlarged prostate with lower urinary tract symptoms: Secondary | ICD-10-CM | POA: Diagnosis not present

## 2016-05-29 ENCOUNTER — Other Ambulatory Visit: Payer: Self-pay | Admitting: Urology

## 2016-06-01 ENCOUNTER — Encounter (HOSPITAL_BASED_OUTPATIENT_CLINIC_OR_DEPARTMENT_OTHER): Payer: Self-pay | Admitting: *Deleted

## 2016-06-01 NOTE — Progress Notes (Signed)
NPO AFTER MN. ARRIVE AT 0600. NEEDS ISTAT AND EKG. WILL TAKE AM MEDS W/ SIPS OF WATER.  

## 2016-06-05 NOTE — H&P (Signed)
CC/HPI: 05/21/16: The patient presents this morning with progressive inability to void. He states that he began having a worsening of his symptoms on Monday and he called the office. His tamsulosin was increased to 2 at bedtime. He states that he did feel somewhat better on Tuesday, but then his symptoms worsened again on Wednesday. He states that beginning last night around 11 pm, he has only been able to void minimal amounts. He states that he can void just enough to relieve the suprapubic pain and pressure that he feels, but then this feeling shortly returns. He reports straining to urinate and dribbling. He has an increase in frequency and nocturia. He states that last night, he was up almost every hour attempting to void. He has been taking finasteride for the past few months, as well as tamsulosin. He has tried Rapaflo in the past, which he states he felt did work somewhat better compared to the tamsulosin. He denies any fevers, gross hematuria, dysuria, or flank pain. He denies any constipation.     CC: I have urinary retention.  HPI: Wayne Brandt is a 66 year-old male established patient who is here for urinary retention.  His problem began 05/21/2016. His current symptoms did not begin after he had a surgical procedure. He currently has an indwelling catheter. The catheter has been in his bladder for 05/21/2016. His urinary retention is being treated with foley catheter, flomax, and proscar.   He does not have pain with urination. He does not have to strain or bear down to start his urinary stream. He does not have a good size and strength to his urinary stream. He is not having problems emptying his bladder. His urine has shut off completely.   He is not having problems with urinary control or incontinence.   He has not previously had an indwelling catheter in for more than two weeks at a time.   He developed urinary retention. A Foley catheter was placed and he has remained very active. He is  currently taking finasteride as well as tamsulosin b.i.d. He has significant outlet obstruction noted by urodynamics.     ALLERGIES: No Allergies    MEDICATIONS: Finasteride 5 mg tablet 1 tablet PO Daily  Rapaflo 8 mg capsule 1 capsule PO Q PM Take with your evening meal  Tamsulosin Hcl 0.4 mg capsule, ext release 24 hr 2 capsule PO Q HS  Tamsulosin Hcl 0.4 mg capsule, ext release 24 hr 1 capsule PO Q PM  Aleve 220 mg tablet Oral  Calcium TABS Oral  Co Q 10 CAPS Oral  Vitamin A TABS 0 Oral  Vitamin B-6 TABS Oral  Vitamin C TABS Oral  Vitamin D TABS 0 Oral  Vitamin E CAPS Oral     GU PSH: Complex cystometrogram, w/ void pressure and urethral pressure profile studies, any technique - 03/11/2016 Complex Uroflow - 03/11/2016 Cystoscopy - 11/28/2015 Emg surf Electrd - 03/11/2016 Inject For cystogram - 03/11/2016 Insert Bladder Cath; Complex - 05/21/2016 Intrabd voidng Press - 03/11/2016      PSH Notes: Knee Surgery, Ankle Surgery, Wrist Surgery, Shoulder Surgery, Tonsillectomy   NON-GU PSH: Remove Tonsils - 2008    GU PMH: Incomplete bladder emptying - 03/27/2016 Urinary Retention - 03/26/2016 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 He did not respond as I had hoped to alpha blockade therapy or to 25 mg  Myrbetriq. We discussed trying a combination of both Rapaflo and Myrbetriq 50 mg of which have given him samples today. - 12/19/2015, He has BPH by exam and cystoscopically does have a fairly large prostate with some obstruction and intravesical median lobe component. In addition he has marked trabeculation of the bladder indicating likely long-standing outlet obstruction with no lesions found within the bladder. I therefore am going to have him begin pharmacologic therapy with Rapaflo and have discussed the potential side effects., -  11/28/2015 Nocturia, In addition to the pharmacologic therapy that I have recommended I also suggested that he eliminate caffeine after noon and possibly try decreasing his overall fluid intake to see if he notes improvement. If he does not respond to pharmacologic and lifestyle changes we discussed proceeding further with urodynamics. - 12/19/2015 Weak Urinary Stream, It appears his weak stream is secondary to BPH although I cannot say 100% that he does not have some form of prostatic inflammation such as prostatitis but at this point I am going to try treating him initially with an alpha-blocker and then have him return in 3 weeks for reassessment. - 11/28/2015 Peyronies Disease, Peyronie's disease - 2015 Other microscopic hematuria, Microscopic hematuria - 2014      PMH Notes: Peyronie's disease: This was first noted in 9/14. At that time he was experiencing curvature approximately 30 dorsally. It did not interfere with intercourse, was not causing any discomfort and he reported no erectile dysfunction.   NON-GU PMH: Asthma, Asthma - 2014 Personal history of other diseases of the digestive system, History of esophageal reflux - 2014 Encounter for general adult medical examination without abnormal findings, Encounter for preventive health examination    FAMILY HISTORY: Death In The Family Father - Runs In Family Death In The Family Mother - Runs In Family Family Health Status Number - Runs In Family Heart Disease - Runs In Family   SOCIAL HISTORY: Marital Status: Married Current Smoking Status: Patient has never smoked.  Types of alcohol consumed: Beer.  Drinks 2 caffeinated drinks per day.     Notes: Marital History - Currently Married, Caffeine Use, Occupation:, Never A Smoker, Alcohol Use   REVIEW OF SYSTEMS:    GU Review Male:   Patient reports frequent urination, hard to postpone urination, get up at night to urinate, leakage of urine, stream starts and stops, trouble starting your  stream, and have to strain to urinate . Patient denies burning/ pain with urination, erection problems, and penile pain.  Gastrointestinal (Upper):   Patient denies nausea, vomiting, and indigestion/ heartburn.  Gastrointestinal (Lower):   Patient denies diarrhea and constipation.  Constitutional:   Patient denies fever, night sweats, weight loss, and fatigue.  Skin:   Patient denies skin rash/ lesion and itching.  Eyes:   Patient denies blurred vision and double vision.  Ears/ Nose/ Throat:   Patient denies sore throat and sinus problems.  Hematologic/Lymphatic:   Patient denies swollen glands and easy bruising.  Cardiovascular:   Patient denies leg swelling and chest pains.  Respiratory:   Patient denies cough and shortness of breath.  Endocrine:   Patient denies excessive thirst.  Musculoskeletal:   Patient denies back pain and joint pain.  Neurological:   Patient denies headaches and dizziness.  Psychologic:   Patient denies depression and anxiety.   VITAL SIGNS:    Weight 161 lb / 73.03 kg  Height 69 in / 175.26 cm  BP 137/73 mmHg  Pulse 62 /min  BMI 23.8 kg/m  GU PHYSICAL EXAMINATION:    Anus and Perineum: No hemorrhoids. No anal stenosis. No rectal fissure, no anal fissure. No edema, no dimple, no perineal tenderness, no anal tenderness.  Scrotum: No lesions. No edema. No cysts. No warts.  Epididymides: Right: no spermatocele, no masses, no cysts, no tenderness, no induration, no enlargement. Left: no spermatocele, no masses, no cysts, no tenderness, no induration, no enlargement.  Testes: No tenderness, no swelling, no enlargement left testes. No tenderness, no swelling, no enlargement right testes. Normal location left testes. Normal location right testes. No mass, no cyst, no varicocele, no hydrocele left testes. No mass, no cyst, no varicocele, no hydrocele right testes.  Urethral Meatus: Normal size. No lesion, no wart, no discharge, no polyp. Normal location.  Penis:  Circumcised, no warts, no cracks. No dorsal Peyronie's plaques, no left corporal Peyronie's plaques, no right corporal Peyronie's plaques, no scarring, no warts. No balanitis, no meatal stenosis.  Prostate: Prostate 1 + size. Left lobe normal consistency, right lobe normal consistency. Symmetrical lobes. No prostate nodule. Left lobe no tenderness, right lobe no tenderness.   Seminal Vesicles: Nonpalpable.  Sphincter Tone: Normal sphincter. No rectal tenderness. No rectal mass.    MULTI-SYSTEM PHYSICAL EXAMINATION:    Constitutional: Well-nourished. No physical deformities. Normally developed. Good grooming.  Neck: Neck symmetrical, not swollen. Normal tracheal position.  Respiratory: No labored breathing, no use of accessory muscles.   Cardiovascular: Normal temperature, normal extremity pulses, no swelling, no varicosities.  Lymphatic: No enlargement of neck, axillae, groin.  Skin: No paleness, no jaundice, no cyanosis. No lesion, no ulcer, no rash.  Neurologic / Psychiatric: Oriented to time, oriented to place, oriented to person. No depression, no anxiety, no agitation.  Gastrointestinal: No mass, no tenderness, no rigidity, non obese abdomen.  Eyes: Normal conjunctivae. Normal eyelids.  Ears, Nose, Mouth, and Throat: Left ear no scars, no lesions, no masses. Right ear no scars, no lesions, no masses. Nose no scars, no lesions, no masses. Normal hearing. Normal lips.  Musculoskeletal: Normal gait and station of head and neck.     PROCEDURES:   UDS CMG: He had a maximum cystometric capacity of 252 cc with normal sensation and instability with his first contraction occurring at 250 cc with an unstable contraction pressure of 39 cm H2O.  Pressure-flow: He voided 76 cc with a maximum flow rate of 1.6 cc/second and a detrusor pressure at maximum flow 78 cm H2O. He did generate a detrusor pressure of 241 cm H2O with a PVR of 75 cc.  EMG: No significant abnormality.  Fluoroscopy: Marked  trabeculation with pseudo-diverticuli and what appears to be secondary right-sided reflux.   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             Flow Rate - 51741  Notes: 300 cc in bladdedr  Voided Volume: 239 cc  Peak Flow Rate: 12 cc/sec  Time of Peak Flow: 0:08 min:sec  Total Void Time: 0:33 min:sec           Voiding Trial - 51700  Notes: He had a peak flow of 12 cc/second with a mean flow of 7 cc/second. His voiding pattern was partially obstructed.  Instilled Volume: 300 cc  Voided Volume: 239 cc   ASSESSMENT/PLAN:      ICD-10 Details  1 GU:   Urinary Retention - He passed his voiding trial today. He will remain off anticholinergics but continue  all of his other medication and was given a single dose of Cipro.  2   BPH w/LUTS - N40.1 Worsening - We discussed long-term management options. Obviously indwelling Foley catheter is not a good option but we did discuss intermittent self-catheterization as an option to circumvent the need for a Foley catheter but empty the bladder. We also discussed maximizing his pharmacologic therapy with the addition of Cialis for daily use although he is concerned about the cost and I am suspect that this may not be effective. We therefore discussed transurethral resection of the prostate in detail. I drew him a picture and we discussed the procedure in detail including its risks and complications. Because of his strong detrusor contractility and long-standing outlet obstruction with some bladder instability underlying I told him that with reduction of outlet resistance he may note worsening urgency and possible mild urge incontinence which could potentially then be managed with pharmacologic therapy and that over time this typically diminishes. We discussed the need for an overnight stay in the hospital, the probability of success as well as the  anticipated postoperative course. He understands and elects to proceed with a transurethral resection of the prostate.

## 2016-06-05 NOTE — Discharge Instructions (Signed)

## 2016-06-07 NOTE — Anesthesia Preprocedure Evaluation (Signed)
Anesthesia Evaluation  Patient identified by MRN, date of birth, ID band Patient awake    Reviewed: Allergy & Precautions, NPO status , Patient's Chart, lab work & pertinent test results  Airway Mallampati: II  TM Distance: >3 FB Neck ROM: Full    Dental no notable dental hx.    Pulmonary asthma ,    Pulmonary exam normal breath sounds clear to auscultation       Cardiovascular hypertension, Pt. on medications + Peripheral Vascular Disease  Normal cardiovascular exam Rhythm:Regular Rate:Normal     Neuro/Psych negative neurological ROS  negative psych ROS   GI/Hepatic Neg liver ROS, hiatal hernia,   Endo/Other  negative endocrine ROS  Renal/GU negative Renal ROS     Musculoskeletal  (+) Arthritis ,   Abdominal   Peds  Hematology negative hematology ROS (+)   Anesthesia Other Findings   Reproductive/Obstetrics negative OB ROS                             Anesthesia Physical Anesthesia Plan  ASA: II  Anesthesia Plan: General   Post-op Pain Management:    Induction: Intravenous  Airway Management Planned: LMA  Additional Equipment:   Intra-op Plan:   Post-operative Plan: Extubation in OR  Informed Consent: I have reviewed the patients History and Physical, chart, labs and discussed the procedure including the risks, benefits and alternatives for the proposed anesthesia with the patient or authorized representative who has indicated his/her understanding and acceptance.   Dental advisory given  Plan Discussed with: CRNA  Anesthesia Plan Comments:         Anesthesia Quick Evaluation

## 2016-06-08 ENCOUNTER — Ambulatory Visit (HOSPITAL_BASED_OUTPATIENT_CLINIC_OR_DEPARTMENT_OTHER): Payer: BLUE CROSS/BLUE SHIELD | Admitting: Anesthesiology

## 2016-06-08 ENCOUNTER — Ambulatory Visit (HOSPITAL_BASED_OUTPATIENT_CLINIC_OR_DEPARTMENT_OTHER)
Admission: RE | Admit: 2016-06-08 | Discharge: 2016-06-09 | Disposition: A | Discharge status: Home / Self Care | Payer: BLUE CROSS/BLUE SHIELD | Source: Ambulatory Visit | Attending: Urology | Admitting: Urology

## 2016-06-08 ENCOUNTER — Encounter (HOSPITAL_COMMUNITY)
Admission: RE | Disposition: A | Discharge status: Home / Self Care | Payer: Self-pay | Source: Ambulatory Visit | Attending: Urology

## 2016-06-08 ENCOUNTER — Other Ambulatory Visit: Payer: Self-pay

## 2016-06-08 ENCOUNTER — Encounter (HOSPITAL_BASED_OUTPATIENT_CLINIC_OR_DEPARTMENT_OTHER): Payer: Self-pay | Admitting: *Deleted

## 2016-06-08 DIAGNOSIS — N400 Enlarged prostate without lower urinary tract symptoms: Secondary | ICD-10-CM | POA: Insufficient documentation

## 2016-06-08 DIAGNOSIS — I739 Peripheral vascular disease, unspecified: Secondary | ICD-10-CM | POA: Diagnosis not present

## 2016-06-08 DIAGNOSIS — N401 Enlarged prostate with lower urinary tract symptoms: Secondary | ICD-10-CM | POA: Diagnosis not present

## 2016-06-08 DIAGNOSIS — Z79899 Other long term (current) drug therapy: Secondary | ICD-10-CM | POA: Insufficient documentation

## 2016-06-08 DIAGNOSIS — R351 Nocturia: Secondary | ICD-10-CM | POA: Insufficient documentation

## 2016-06-08 DIAGNOSIS — R3916 Straining to void: Secondary | ICD-10-CM | POA: Insufficient documentation

## 2016-06-08 DIAGNOSIS — K200 Eosinophilic esophagitis: Secondary | ICD-10-CM | POA: Diagnosis not present

## 2016-06-08 DIAGNOSIS — Z791 Long term (current) use of non-steroidal anti-inflammatories (NSAID): Secondary | ICD-10-CM | POA: Diagnosis not present

## 2016-06-08 DIAGNOSIS — I10 Essential (primary) hypertension: Secondary | ICD-10-CM | POA: Diagnosis not present

## 2016-06-08 DIAGNOSIS — N138 Other obstructive and reflux uropathy: Secondary | ICD-10-CM | POA: Diagnosis present

## 2016-06-08 DIAGNOSIS — R350 Frequency of micturition: Secondary | ICD-10-CM | POA: Diagnosis not present

## 2016-06-08 DIAGNOSIS — R3129 Other microscopic hematuria: Secondary | ICD-10-CM | POA: Diagnosis not present

## 2016-06-08 HISTORY — DX: Induration penis plastica: N48.6

## 2016-06-08 HISTORY — DX: Family history of ischemic heart disease and other diseases of the circulatory system: Z82.49

## 2016-06-08 HISTORY — PX: TRANSURETHRAL RESECTION OF PROSTATE: SHX73

## 2016-06-08 HISTORY — DX: Personal history of other malignant neoplasm of skin: Z85.828

## 2016-06-08 HISTORY — DX: Essential (primary) hypertension: I10

## 2016-06-08 HISTORY — DX: Unspecified symptoms and signs involving the genitourinary system: R39.9

## 2016-06-08 HISTORY — DX: Mild intermittent asthma, uncomplicated: J45.20

## 2016-06-08 HISTORY — DX: Retention of urine, unspecified: R33.9

## 2016-06-08 HISTORY — DX: Other specified postprocedural states: Z98.890

## 2016-06-08 HISTORY — DX: Male erectile dysfunction, unspecified: N52.9

## 2016-06-08 HISTORY — DX: Personal history of other diseases of the digestive system: Z87.19

## 2016-06-08 HISTORY — DX: Benign prostatic hyperplasia without lower urinary tract symptoms: N40.0

## 2016-06-08 LAB — POCT I-STAT 4, (NA,K, GLUC, HGB,HCT)
Glucose, Bld: 90 mg/dL (ref 65–99)
HCT: 36 % — ABNORMAL LOW (ref 39.0–52.0)
Hemoglobin: 12.2 g/dL — ABNORMAL LOW (ref 13.0–17.0)
Potassium: 4.2 mmol/L (ref 3.5–5.1)
Sodium: 130 mmol/L — ABNORMAL LOW (ref 135–145)

## 2016-06-08 SURGERY — TURP (TRANSURETHRAL RESECTION OF PROSTATE)
Anesthesia: General | Site: Prostate

## 2016-06-08 MED ORDER — MIDAZOLAM HCL 2 MG/2ML IJ SOLN
INTRAMUSCULAR | Status: AC
  Filled 2016-06-08: qty 2

## 2016-06-08 MED ORDER — BELLADONNA ALKALOIDS-OPIUM 16.2-60 MG RE SUPP: 1.0000 | Freq: Four times a day (QID) | RECTAL | Status: DC | PRN

## 2016-06-08 MED ORDER — GLYCOPYRROLATE 0.2 MG/ML IV SOSY
PREFILLED_SYRINGE | INTRAVENOUS | Status: DC | PRN
  Administered 2016-06-08: .2 mg via INTRAVENOUS

## 2016-06-08 MED ORDER — LIDOCAINE 2% (20 MG/ML) 5 ML SYRINGE
INTRAMUSCULAR | Status: AC
  Filled 2016-06-08: qty 5

## 2016-06-08 MED ORDER — ACETAMINOPHEN 325 MG PO TABS: 650.0000 mg | ORAL_TABLET | ORAL | Status: DC | PRN

## 2016-06-08 MED ORDER — PROPOFOL 10 MG/ML IV BOLUS
INTRAVENOUS | Status: AC
  Filled 2016-06-08: qty 20

## 2016-06-08 MED ORDER — CIPROFLOXACIN IN D5W 400 MG/200ML IV SOLN
400.0000 mg | Freq: Two times a day (BID) | INTRAVENOUS | Status: AC
  Administered 2016-06-08 – 2016-06-09 (×2): 400 mg via INTRAVENOUS

## 2016-06-08 MED ORDER — SODIUM CHLORIDE 0.9 % IR SOLN
Status: DC | PRN
  Administered 2016-06-08: 9000 mL

## 2016-06-08 MED ORDER — BACITRACIN-NEOMYCIN-POLYMYXIN 400-5-5000 EX OINT: 1.0000 "application " | TOPICAL_OINTMENT | Freq: Three times a day (TID) | CUTANEOUS | Status: DC | PRN

## 2016-06-08 MED ORDER — PROMETHAZINE HCL 25 MG/ML IJ SOLN: 6.2500 mg | INTRAMUSCULAR | Status: DC | PRN

## 2016-06-08 MED ORDER — LIDOCAINE 2% (20 MG/ML) 5 ML SYRINGE
INTRAMUSCULAR | Status: DC | PRN
  Administered 2016-06-08: 60 mg via INTRAVENOUS

## 2016-06-08 MED ORDER — EPHEDRINE SULFATE-NACL 50-0.9 MG/10ML-% IV SOSY
PREFILLED_SYRINGE | INTRAVENOUS | Status: DC | PRN
  Administered 2016-06-08 (×3): 10 mg via INTRAVENOUS

## 2016-06-08 MED ORDER — SODIUM CHLORIDE 0.9 % IR SOLN
3000.0000 mL | Status: DC
  Administered 2016-06-08 (×2): 3000 mL

## 2016-06-08 MED ORDER — ONDANSETRON HCL 4 MG/2ML IJ SOLN
INTRAMUSCULAR | Status: AC
  Filled 2016-06-08: qty 2

## 2016-06-08 MED ORDER — SODIUM CHLORIDE 0.9 % IV SOLN
INTRAVENOUS | Status: DC
  Administered 2016-06-08: 11:00:00 via INTRAVENOUS

## 2016-06-08 MED ORDER — PROPOFOL 10 MG/ML IV BOLUS
INTRAVENOUS | Status: DC | PRN
  Administered 2016-06-08: 150 mg via INTRAVENOUS

## 2016-06-08 MED ORDER — DEXAMETHASONE SODIUM PHOSPHATE 4 MG/ML IJ SOLN
INTRAMUSCULAR | Status: DC | PRN
  Administered 2016-06-08: 10 mg via INTRAVENOUS

## 2016-06-08 MED ORDER — HYDROCODONE-ACETAMINOPHEN 10-325 MG PO TABS: 1.0000 | ORAL_TABLET | ORAL | 0 refills | Status: DC | PRN

## 2016-06-08 MED ORDER — HYDROMORPHONE HCL 2 MG/ML IJ SOLN: 0.2500 mg | INTRAMUSCULAR | Status: DC | PRN

## 2016-06-08 MED ORDER — LACTATED RINGERS IV SOLN
INTRAVENOUS | Status: DC
  Administered 2016-06-08 (×2): via INTRAVENOUS
  Filled 2016-06-08: qty 1000

## 2016-06-08 MED ORDER — ONDANSETRON HCL 4 MG/2ML IJ SOLN
INTRAMUSCULAR | Status: DC | PRN
  Administered 2016-06-08: 4 mg via INTRAVENOUS

## 2016-06-08 MED ORDER — CIPROFLOXACIN HCL 500 MG PO TABS: 500.0000 mg | ORAL_TABLET | Freq: Two times a day (BID) | ORAL | 0 refills | Status: DC

## 2016-06-08 MED ORDER — DEXAMETHASONE SODIUM PHOSPHATE 10 MG/ML IJ SOLN
INTRAMUSCULAR | Status: AC
  Filled 2016-06-08: qty 1

## 2016-06-08 MED ORDER — FENTANYL CITRATE (PF) 100 MCG/2ML IJ SOLN
INTRAMUSCULAR | Status: AC
Start: 2016-06-08 — End: 2016-06-08
  Filled 2016-06-08: qty 2

## 2016-06-08 MED ORDER — ACETAMINOPHEN 500 MG PO TABS
1000.0000 mg | ORAL_TABLET | Freq: Four times a day (QID) | ORAL | Status: DC
  Administered 2016-06-08 – 2016-06-09 (×3): 1000 mg via ORAL
  Filled 2016-06-08 (×3): qty 2

## 2016-06-08 MED ORDER — MIDAZOLAM HCL 5 MG/5ML IJ SOLN
INTRAMUSCULAR | Status: DC | PRN
  Administered 2016-06-08: 1 mg via INTRAVENOUS

## 2016-06-08 MED ORDER — CIPROFLOXACIN IN D5W 400 MG/200ML IV SOLN
INTRAVENOUS | Status: AC
  Filled 2016-06-08: qty 200

## 2016-06-08 MED ORDER — HYDROCODONE-ACETAMINOPHEN 5-325 MG PO TABS: 1.0000 | ORAL_TABLET | ORAL | Status: DC | PRN

## 2016-06-08 MED ORDER — FENTANYL CITRATE (PF) 100 MCG/2ML IJ SOLN
INTRAMUSCULAR | Status: DC | PRN
  Administered 2016-06-08: 50 ug via INTRAVENOUS

## 2016-06-08 MED ORDER — CIPROFLOXACIN IN D5W 400 MG/200ML IV SOLN
400.0000 mg | INTRAVENOUS | Status: AC
  Administered 2016-06-08: 400 mg via INTRAVENOUS
  Filled 2016-06-08: qty 200

## 2016-06-08 MED ORDER — WHITE PETROLATUM GEL
Status: AC
  Filled 2016-06-08: qty 5

## 2016-06-08 MED ORDER — MEPERIDINE HCL 25 MG/ML IJ SOLN: 6.2500 mg | INTRAMUSCULAR | Status: DC | PRN

## 2016-06-08 MED ORDER — ONDANSETRON HCL 4 MG/2ML IJ SOLN: 4.0000 mg | INTRAMUSCULAR | Status: DC | PRN

## 2016-06-08 SURGICAL SUPPLY — 34 items
BAG DRAIN URO-CYSTO SKYTR STRL (DRAIN) ×2 IMPLANT
BAG DRN ANRFLXCHMBR STRAP LEK (BAG)
BAG DRN UROCATH (DRAIN) ×1
BAG URINE DRAINAGE (UROLOGICAL SUPPLIES) ×1 IMPLANT
BAG URINE LEG 19OZ MD ST LTX (BAG) IMPLANT
CATH FOLEY 3WAY 20FR (CATHETERS) ×1 IMPLANT
CATH FOLEY 3WAY 30CC 24FR (CATHETERS)
CATH HEMA 3WAY 30CC 24FR COUDE (CATHETERS) IMPLANT
CATH HEMA 3WAY 30CC 24FR RND (CATHETERS) IMPLANT
CATH URTH STD 24FR FL 3W 2 (CATHETERS) IMPLANT
CLOTH BEACON ORANGE TIMEOUT ST (SAFETY) ×2 IMPLANT
ELECT BUTTON BIOP 24F 90D PLAS (MISCELLANEOUS) IMPLANT
ELECT LOOP MED HF 24F 12D (CUTTING LOOP) IMPLANT
ELECT REM PT RETURN 9FT ADLT (ELECTROSURGICAL)
ELECTRODE REM PT RTRN 9FT ADLT (ELECTROSURGICAL) ×1 IMPLANT
EVACUATOR MICROVAS BLADDER (UROLOGICAL SUPPLIES) ×1 IMPLANT
GLOVE BIO SURGEON STRL SZ8 (GLOVE) ×2 IMPLANT
GLOVE BIOGEL PI IND STRL 7.5 (GLOVE) IMPLANT
GLOVE BIOGEL PI INDICATOR 7.5 (GLOVE) ×2
GOWN STRL REUS W/ TWL LRG LVL3 (GOWN DISPOSABLE) ×1 IMPLANT
GOWN STRL REUS W/ TWL XL LVL3 (GOWN DISPOSABLE) ×1 IMPLANT
GOWN STRL REUS W/TWL LRG LVL3 (GOWN DISPOSABLE) ×2
GOWN STRL REUS W/TWL XL LVL3 (GOWN DISPOSABLE) ×1 IMPLANT
HOLDER FOLEY CATH W/STRAP (MISCELLANEOUS) ×1 IMPLANT
IV NS IRRIG 3000ML ARTHROMATIC (IV SOLUTION) ×3 IMPLANT
KIT RM TURNOVER CYSTO AR (KITS) ×2 IMPLANT
LOOP CUT BIPOLAR 24F LRG (ELECTROSURGICAL) ×1 IMPLANT
MANIFOLD NEPTUNE II (INSTRUMENTS) ×1 IMPLANT
PACK CYSTO (CUSTOM PROCEDURE TRAY) ×2 IMPLANT
PLUG CATH AND CAP STER (CATHETERS) IMPLANT
SYR 30ML LL (SYRINGE) ×1 IMPLANT
SYRINGE IRR TOOMEY STRL 70CC (SYRINGE) IMPLANT
TUBE CONNECTING 12X1/4 (SUCTIONS) IMPLANT
WATER STERILE IRR 3000ML UROMA (IV SOLUTION) IMPLANT

## 2016-06-08 NOTE — Op Note (Signed)
PATIENT:  Wayne Brandt  PRE-OPERATIVE DIAGNOSIS: BPH with outlet obstruction  POST-OPERATIVE DIAGNOSIS: Same  PROCEDURE: TURP  SURGEON:  Claybon Jabs  INDICATION: Wayne Brandt is a 66 year old male with progressively worsening obstructive voiding symptoms that have not responded to pharmacologic therapy. He has been evaluated cystoscopically and found to have outlet obstruction with trabeculation of his bladder and urodynamically he was found to have high pressure voiding consistent with outlet obstruction as well. His PSA was normal in 3/17 at 0.85 with a normal DRE previously. We discussed the treatment options and he has elected to proceed with surgery.  ANESTHESIA:  General  EBL:  Minimal  DRAINS: 77 [place], three-way Foley catheter  SPECIMEN:  Prostate chips to pathology  After informed consent the patient was brought to the major OR and placed on the table. He was administered general anesthesia and then moved to the dorsal lithotomy position. His genitalia was sterilely prepped and draped and an official timeout was then performed.  Initially the 11 French resectoscope sheath with the visual obturator was passed down the urethra with no abnormality noted however there was some resistance to passage of the scope through the membranous urethra without an obvious stricture present. I therefore removed the scope and dilated with Leander Rams sounds to 46 [place] without difficulty. I was then able to pass the scope on into the prostatic urethra and I noted the prostate appeared to be friable and pale in appearance. It also seems somewhat fixed._ trabeculation but was free of any tumor stones or inflammatory lesions. The ureteral orifices were noted to be of normal configuration and position. Withdrawing the scope into the prostatic urethra I noted obstructing bilobar hypertrophy with e   I then inserted the resectoscope element with 30 lens and  performed a systematic inspection  of the bladder. I noted the bladder had 3-4+ trabeculation but no tumors, stones or worrisome lesions. I noted both ureteral orifices were noted to be well away from the bladder neck and in the normal anatomic position. The prostate was noted to be obstructing due to lateral lobe hypertrophy with some median lobe present as well.  Resection was then begun. I first resected the median lobe in the midline down to the level of the bladder neck. I then began resecting the left lobe of the prostate by resecting first from the level of the bladder neck back to the level of the Veru at the 5:00 position and then progressed in a counterclockwise direction resecting all of the adenomatous tissue of the left lobe down to the surgical capsule. Bleeding points were cauterized as they were encountered. I then turned my attention to the right lobe of the prostate and it was resected in an identical fashion. Tissue in the area of the apex was then resected circumferentially with care being taken to maintain the resection proximal to the Veru at all times. The prostatic chips were then flushed into the bladder and the Microvasive evacuator was then used to evacuate all chips from the bladder. Reinspection of the bladder revealed the mucosa to be intact, the ureteral orifices intact as well and well away from the bladder neck and area of resection. There were no prostatic chips remaining within the bladder. The prostatic capsule was intact throughout with no perforation and there was no active bleeding noted at the end of the procedure.  The resectoscope was therefore removed and the 22 French three-way Foley catheter was then inserted and balloon was filled to  30 cc. This was placed on mild traction and the bladder was irrigated with the irrigant returning clear. The catheter was then hooked to closed system drainage and continuous irrigation and the patient was awakened and taken to recovery room in stable and satisfactory  condition. He tolerated the procedure well and there were no intraoperative complications.  PLAN OF CARE: Observation overnight with anticipated discharge in the morning.  PATIENT DISPOSITION:  PACU - Hemodynamically stable.

## 2016-06-08 NOTE — Progress Notes (Signed)
This RN received report from previous Therapist, sports. I agree with previous assessment of this pt. Will continue to monitor closely.

## 2016-06-08 NOTE — Anesthesia Procedure Notes (Signed)
Procedure Name: LMA Insertion Date/Time: 06/08/2016 7:29 AM Performed by: Denna Haggard D Pre-anesthesia Checklist: Patient identified, Emergency Drugs available, Suction available and Patient being monitored Patient Re-evaluated:Patient Re-evaluated prior to inductionOxygen Delivery Method: Circle system utilized Preoxygenation: Pre-oxygenation with 100% oxygen Intubation Type: IV induction Ventilation: Mask ventilation without difficulty LMA: LMA inserted LMA Size: 4.0 Number of attempts: 1 Airway Equipment and Method: Bite block Placement Confirmation: positive ETCO2 Tube secured with: Tape Dental Injury: Teeth and Oropharynx as per pre-operative assessment

## 2016-06-08 NOTE — Transfer of Care (Signed)
Immediate Anesthesia Transfer of Care Note  Patient: Wayne Brandt  Procedure(s) Performed: Procedure(s) (LRB): TRANSURETHRAL RESECTION OF THE PROSTATE (TURP) (N/A)  Patient Location: PACU  Anesthesia Type: General  Level of Consciousness: awake, oriented, sedated and patient cooperative  Airway & Oxygen Therapy: Patient Spontanous Breathing and Patient connected to face mask oxygen  Post-op Assessment: Report given to PACU RN and Post -op Vital signs reviewed and stable  Post vital signs: Reviewed and stable  Complications: No apparent anesthesia complications Last Vitals:  Vitals:   06/08/16 0610 06/08/16 0807  BP: 127/68 134/73  Pulse: (!) 53 72  Resp: 16 20  Temp: 36.7 C 36.4 C

## 2016-06-08 NOTE — Progress Notes (Signed)
Night of surgery note  Subjective: The patient is doing well.  No complaints.  Objective: Vital signs in last 24 hours: Temp:  [97.6 F (36.4 C)-98 F (36.7 C)] 97.6 F (36.4 C) (02/26 0807) Pulse Rate:  [51-72] 54 (02/26 0915) Resp:  [10-20] 15 (02/26 0915) BP: (120-134)/(67-74) 123/74 (02/26 0915) SpO2:  [98 %-100 %] 99 % (02/26 0915) Weight:  [160 lb (72.6 kg)] 160 lb (72.6 kg) (02/26 0610)  Intake/Output this shift: Total I/O In: 4480 [P.O.:480; I.V.:1000; Other:3000] Out: 6200 [Urine:6200]  Physical Exam:  General: Alert and oriented. Abdomen: Soft, Nondistended. His catheter is draining slightly pink urine on slow CBI.  Lab Results:  Recent Labs  06/08/16 0645  HGB 12.2*  HCT 36.0*    Assessment/Plan: I had previously discussed my intraoperative findings with his wife and discuss those findings with him as well.  We will await the final pathology in order to determine the reason for his abnormal appearing prostate. 1) Continue to monitor 2) Per orders with anticipated discharge in the morning   Wayne Brandt C. Karsten Ro, MD  Wayne Brandt 06/08/2016, 1:32 PM

## 2016-06-08 NOTE — Anesthesia Postprocedure Evaluation (Signed)
Anesthesia Post Note  Patient: Wayne Brandt  Procedure(s) Performed: Procedure(s) (LRB): TRANSURETHRAL RESECTION OF THE PROSTATE (TURP) (N/A)  Patient location during evaluation: PACU Anesthesia Type: General Level of consciousness: sedated and patient cooperative Pain management: pain level controlled Vital Signs Assessment: post-procedure vital signs reviewed and stable Respiratory status: spontaneous breathing Cardiovascular status: stable Anesthetic complications: no       Last Vitals:  Vitals:   06/08/16 1518 06/08/16 2042  BP: 129/76 128/79  Pulse: (!) 58 (!) 55  Resp: 18 18  Temp: 37.1 C 36.6 C    Last Pain:  Vitals:   06/08/16 2042  TempSrc: Oral  PainSc:                  [name]

## 2016-06-09 ENCOUNTER — Encounter (HOSPITAL_BASED_OUTPATIENT_CLINIC_OR_DEPARTMENT_OTHER): Payer: Self-pay | Admitting: Urology

## 2016-06-09 DIAGNOSIS — Z79899 Other long term (current) drug therapy: Secondary | ICD-10-CM | POA: Diagnosis not present

## 2016-06-09 DIAGNOSIS — N401 Enlarged prostate with lower urinary tract symptoms: Secondary | ICD-10-CM | POA: Diagnosis not present

## 2016-06-09 DIAGNOSIS — R351 Nocturia: Secondary | ICD-10-CM | POA: Diagnosis not present

## 2016-06-09 DIAGNOSIS — I739 Peripheral vascular disease, unspecified: Secondary | ICD-10-CM | POA: Diagnosis not present

## 2016-06-09 DIAGNOSIS — R3916 Straining to void: Secondary | ICD-10-CM | POA: Diagnosis not present

## 2016-06-09 DIAGNOSIS — Z791 Long term (current) use of non-steroidal anti-inflammatories (NSAID): Secondary | ICD-10-CM | POA: Diagnosis not present

## 2016-06-09 DIAGNOSIS — N138 Other obstructive and reflux uropathy: Secondary | ICD-10-CM | POA: Diagnosis not present

## 2016-06-09 DIAGNOSIS — I10 Essential (primary) hypertension: Secondary | ICD-10-CM | POA: Diagnosis not present

## 2016-06-09 DIAGNOSIS — R350 Frequency of micturition: Secondary | ICD-10-CM | POA: Diagnosis not present

## 2016-06-09 MED ORDER — DIPHENHYDRAMINE HCL 25 MG PO CAPS
25.0000 mg | ORAL_CAPSULE | Freq: Once | ORAL | Status: AC
  Administered 2016-06-09: 25 mg via ORAL
  Filled 2016-06-09: qty 1

## 2016-06-09 NOTE — Discharge Summary (Signed)
Physician Discharge Summary  Patient ID: Wayne Brandt MRN: [id number] DOB/AGE: Sep 07, 1950 66 y.o.  Admit date: 06/08/2016 Discharge date: 06/09/2016  Admission Diagnoses: BENIGN PROSTATE HYPERPLASIA WITH RETENTION  Discharge Diagnoses:  Active Problems:   BPH with urinary obstruction   Discharged Condition: good  Hospital Course: He had a history of significant outlet obstruction.  He was evaluated cystoscopically and noted to have an obstructing prostate and with urodynamics that revealed the same.  He therefore elected to proceed with surgical correction and therefore underwent a transurethral resection of the prostate and proceeded without complication.  The night of surgery he appeared to be doing well.  He indicated he had no pain and his urine was slightly pink on CBI at a slow rate. his catheter has been removed and he has voided already.  He is therefore felt ready for discharge at this time.  Significant Diagnostic Studies: No results found.  Discharge Exam: Blood pressure 128/79, pulse (!) 55, temperature 97.8 F (36.6 C), temperature source Oral, resp. rate 18, height 5\' 9"  (1.753 m), weight 159 lb 13.3 oz (72.5 kg), SpO2 97 %. Alert, oriented and in no apparent distress. Chest: Normal respiratory effort. CV: Bradycardic rate and regular rhythm Abdomen: Soft, nontender without mass. GU: Normal male phallus without discharge or blood at the meatus.  Disposition:  He will be discharged to his home with a prescription for pain medication and antibiotics with follow-up as an outpatient.   Allergies as of 06/09/2016   No Known Allergies     Medication List    STOP taking these medications   finasteride 5 MG tablet Commonly known as:  PROSCAR   tamsulosin 0.4 MG Caps capsule Commonly known as:  FLOMAX     TAKE these medications   albuterol 108 (90 Base) MCG/ACT inhaler Commonly known as:  PROVENTIL HFA;VENTOLIN HFA Inhale 2 puffs into the lungs every 6 (six)  hours as needed for wheezing. Use 30 mins prior to exercise   ciprofloxacin 500 MG tablet Commonly known as:  CIPRO Take 1 tablet (500 mg total) by mouth 2 (two) times daily.   CoQ-10 100 MG Caps Take 1 capsule by mouth daily.   fish oil-omega-3 fatty acids 1000 MG capsule Take 1 g by mouth daily.   HYDROcodone-acetaminophen 10-325 MG tablet Commonly known as:  NORCO Take 1-2 tablets by mouth every 4 (four) hours as needed for moderate pain. Maximum dose per 24 hours - 8 pills   losartan 50 MG tablet Commonly known as:  COZAAR Take 1 tablet (50 mg total) by mouth daily. What changed:  when to take this   vitamin B-12 1000 MCG tablet Commonly known as:  CYANOCOBALAMIN Take 1,000 mcg by mouth daily.   vitamin B-6 500 MG tablet Take 500 mg by mouth daily.   vitamin C 500 MG tablet Commonly known as:  ASCORBIC ACID Take 500 mg by mouth daily.   Vitamin D-3 1000 units Caps Take 2 capsules by mouth daily.      [facility] On 06/23/2016.   Why:  For your appiontment at 9:15 Contact information: [address] [place] [address] [phone]           Signed: Claybon Jabs 06/09/2016, 4:09 AM

## 2016-06-09 NOTE — Progress Notes (Signed)
Completed D/C teaching. Answered questions. Gave prescriptions. Pt will be D/C home in stable condition.

## 2016-06-23 DIAGNOSIS — R338 Other retention of urine: Secondary | ICD-10-CM | POA: Diagnosis not present

## 2016-06-23 DIAGNOSIS — N401 Enlarged prostate with lower urinary tract symptoms: Secondary | ICD-10-CM | POA: Diagnosis not present

## 2016-07-08 NOTE — Progress Notes (Addendum)
Chief Complaint  Patient presents with  . Annual Exam    fasting annual exam. No concerns.     Wayne Brandt is a 66 y.o. male who presents for a complete physical.  He has the following concerns:  He recently underwent TURP by Dr. Karsten Ro. He is having issues with urinary incontinence since the procedure, mostly during the day. Has some leakage when sitting/standing.  He was started on losartan by Dr. Redmond School in November for high blood pressures. He doesn't check his BP elsewhere.  Denies headaches, dizziness or side effects. He was also treated with multiple courses of antibiotics (z-pak, followed by 2 rounds of augmentin) for cough, the last of which was in late January. Cough has completely resolved.  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 Didn't bring paperwork today.  H/o RAD/asthma. He sometimes has problems in the evening, or when he is at someone else's house, related to odors, scents.  Last needed albuterol at Christmas.  Denies heartburn.  Peyronie's disease and erectile dysfunction.  Asymptomatic with respect to Peyronie's. No treatment was recommended.  He is under the care of urologist (see above); not in a sexual relationship with his wife (due to her health issues, fibromyalgia, etc.)  Eosinophilic esophagitis: He has intermittent episodes of dysphagia. Certain foods are harder to swallow--meats, hard broccoli. He last saw Dr. Earlean Shawl in 2016. Gets worse intermittently, no significant change in the last couple of years.  He has had esophageal dilation in the past, which helped. He has been prescribed an inhaled steroid (Flovent), directed to SWALLOW, to help with esophageal symptoms.He stopped taking it because it was expensive and didn't help at all.  Other doctors caring for patient include: Urologist: Dr. Karsten Ro Dentist: Dr. Deatra Ina Dermatologist: Dr. Renda Rolls Cardiologist: Dr. Percival Spanish GI: Dr. Earlean Shawl Ortho: Dr. Wynelle Link  He doesn't have Living Will or  Healthcare power of attorney.   Immunization History  Administered Date(s) Administered  . Influenza Whole 01/05/2008  . Influenza, High Dose Seasonal PF 02/06/2016  . Influenza,inj,Quad PF,36+ Mos 02/01/2013, 05/28/2015  . Pneumococcal Conjugate-13 07/04/2015  . Pneumococcal Polysaccharide-23 06/01/2007  . Td 06/20/1997  . Tdap 01/05/2008  . Zoster 02/25/2012   Last colonoscopy: 02/2014; pt states told 10 years Last PSA: 06/2015,doesn't believe it was checked by urology Dentist: twice yearly  Ophtho: yearly Exercise: daily (elliptical, bike), 30-40 minutes daily. Tennis, golf. Exercises daily. Weights every other day. Lipids: Lab Results  Component Value Date   CHOL 194 06/28/2014   HDL 82 06/28/2014   LDLCALC 102 (H) 06/28/2014   TRIG 49 06/28/2014   CHOLHDL 2.4 06/28/2014   Vitamin D was low at 27 lin 2016, normal last year (33)  Depression screen: negative Fall screen: recent fall--fell playing tennis 2 days ago--black eye.  No significant injury.  He doesn't have Living Will or Healthcare Power of Attorney  ROS: The patient denies anorexia, fever, headaches, vision loss, decreased hearing, ear pain, hoarseness, chest pain, palpitations, dizziness, syncope, dyspnea on exertion, swelling, nausea, vomiting, diarrhea, constipation, abdominal pain, melena, hematochezia, indigestion/heartburn, hematuria, incontinence, weakened urine stream, dysuria, weakness, tremor, suspicious skin lesions (sees derm, Dr. Renda Rolls regularly), depression, anxiety, abnormal bleeding/bruising, or enlarged lymph nodes  H/o microscopic hematuria--no visible blood per pt  No rectal bleeding since getting hemorrhoidal banding  Occasional tingling R hand from ulnar nerve issue (chronic)--intermittent, overall doing better. Thinks the B6 he takes helps, not bothering him recently. Worse in cold weather. No weakness. Occasional L thumb pain, worse with  golfing (known arthritis), occasional  bilateral wrist pain.  +intermittent dysphagia as per HPI. Incontinence during the day, per HPI. Up 3x at night to void (1 beers in the evening). This is much better since the TURP. Not in a sexual relationship due to wife's health issues.  Some ear plugging/popping after flying. Cough has resolved, (rarely just in the morning).    PHYSICAL EXAM:  BP 120/72 (BP Location: Left Arm, Patient Position: Sitting, Cuff Size: Normal)   Pulse 60   Ht 5' 8.25" (1.734 m)   Wt 161 lb 12.8 oz (73.4 kg)   BMI 24.42 kg/m   Wt Readings from Last 3 Encounters:  06/08/16 159 lb 13.3 oz (72.5 kg)  04/09/16 163 lb (73.9 kg)  03/10/16 165 lb (74.8 kg)    General Appearance:  Alert, cooperative, no distress, appears stated age   Head:  Normocephalic; ecchymosis below right eye, mildly tender in this area.   No significant soft tissue swelling, no bony stepoff, just bruising   Eyes:  PERRL, conjunctiva/corneas clear, EOM's intact, fundi benign   Ears:  Normal TM's and external ear canals   Nose:  Nares normal, no erythema or purulence, no sinus tenderness   Throat:  Lips, mucosa, and tongue normal; teeth and gums normal.   Neck:  Supple, no lymphadenopathy; thyroid: no enlargement/tenderness/nodules; no carotid bruit or JVD   Back:  Spine nontender, no curvature, ROM normal, no CVA tenderness   Lungs:  Clear to auscultation bilaterally without wheezes, rales or ronchi; respirations unlabored   Chest Wall:  No tenderness or deformity   Heart:  Regular rate and rhythm, S1 and S2 normal, no murmur, rub or gallop   Breast Exam:  No chest wall tenderness, masses or gynecomastia   Abdomen:  Soft, non-tender, nondistended, normoactive bowel sounds, no masses, no hepatosplenomegaly   Genitalia:  Deferred to urologist   Rectal:  Deferred to urologist  Extremities:  No clubbing, cyanosis or edema. +leg length discrepancy--left is shorter than right  leg (since L ankle fusion per pt). L ankle fused; WHSS R knee. Thickening and onycholytic changes to many nails.  Pulses:  2+ and symmetric all extremities   Skin:  Skin color, texture, turgor normal, no rashes. Actinic damage throughout, with AK's on hands bilaterally. Discolored toenails.  Lymph nodes:  Cervical, supraclavicular, and axillary nodes normal   Neurologic:  CNII-XII intact, normal strength, sensation and gait; reflexes 2+ and symmetric throughout             Psych:   Normal mood, affect, hygiene and grooming   ASSESSMENT/PLAN:  Annual physical exam - Plan: Visual acuity screening, POCT Urinalysis Dipstick, Lipid panel, Comprehensive metabolic panel  Aortic atherosclerosis ([place]) - start ASA daily.  Consider statin (pt not interested) - Plan: Lipid panel  Essential hypertension - well controlled; continue losartan - Plan: Lipid panel, Comprehensive metabolic panel  Allergic rhinitis, unspecified chronicity, unspecified seasonality, unspecified trigger  Exercise-induced asthma - and allergy-induced; avoid triggers; allergy meds and albuterol prn - Plan: Spirometry with Graph  Eosinophilic esophagitis - didn't notice benefit from inhaled steroid treatment. intermittent dysphagia.  f/u with Dr. Earlean Shawl if worsening  Immunization due - Plan: Pneumococcal polysaccharide vaccine 23-valent greater than or equal to 2yo subcutaneous/IM   C-met, lipid  DiscussedTd booster--due next year, but likely will be on Medicare and will have to get through pharmacy, not in our office.  He declines today, reports insurance is poor currently.  Recommended at least 30 minutes of aerobic activity  at least 5 days/week, weight-bearing exercise at least 2x/wk; proper sunscreen use reviewed; healthy diet and alcohol recommendations (less than or equal to 2 drinks/day) reviewed; regular seatbelt use; changing batteries in smoke detectors. Self-testicular exams. Immunization recommendations  discussed- pneumovax given today; continue yearly flu shots. Shingrix recommended. Colonoscopy recommendations reviewed--UTD.  Living Will and healthcare POA paperwork given and discussed today.   Spirometry--normal  Had EKG with surgery last month--reviewed.  LVH noted, otherwise normal.  Consider using pepcid AC, zantac or prilosec prior to dinner if/when you are having problems with chest tightness and wheezing in the evenings. It may be related to reflux. You can still use the albuterol if needed.  Call when you need refills on albuterol inhaler.  Start taking enteric-coated 56m aspirin daily.

## 2016-07-09 ENCOUNTER — Ambulatory Visit (INDEPENDENT_AMBULATORY_CARE_PROVIDER_SITE_OTHER): Payer: BLUE CROSS/BLUE SHIELD | Admitting: Family Medicine

## 2016-07-09 ENCOUNTER — Encounter: Payer: Self-pay | Admitting: Family Medicine

## 2016-07-09 VITALS — BP 120/72 | HR 60 | Ht 68.25 in | Wt 161.8 lb

## 2016-07-09 DIAGNOSIS — Z0000 Encounter for general adult medical examination without abnormal findings: Secondary | ICD-10-CM

## 2016-07-09 DIAGNOSIS — I10 Essential (primary) hypertension: Secondary | ICD-10-CM

## 2016-07-09 DIAGNOSIS — Z23 Encounter for immunization: Secondary | ICD-10-CM

## 2016-07-09 DIAGNOSIS — J45990 Exercise induced bronchospasm: Secondary | ICD-10-CM | POA: Diagnosis not present

## 2016-07-09 DIAGNOSIS — K200 Eosinophilic esophagitis: Secondary | ICD-10-CM | POA: Diagnosis not present

## 2016-07-09 DIAGNOSIS — J309 Allergic rhinitis, unspecified: Secondary | ICD-10-CM

## 2016-07-09 DIAGNOSIS — I700 Atherosclerosis of aorta: Secondary | ICD-10-CM

## 2016-07-09 LAB — COMPREHENSIVE METABOLIC PANEL
ALT: 17 U/L (ref 9–46)
AST: 29 U/L (ref 10–35)
Albumin: 3.8 g/dL (ref 3.6–5.1)
Alkaline Phosphatase: 114 U/L (ref 40–115)
BUN: 10 mg/dL (ref 7–25)
CALCIUM: 9 mg/dL (ref 8.6–10.3)
CO2: 25 mmol/L (ref 20–31)
Chloride: 96 mmol/L — ABNORMAL LOW (ref 98–110)
Creat: 0.77 mg/dL (ref 0.70–1.25)
GLUCOSE: 92 mg/dL (ref 65–99)
POTASSIUM: 4.7 mmol/L (ref 3.5–5.3)
Sodium: 132 mmol/L — ABNORMAL LOW (ref 135–146)
Total Bilirubin: 0.5 mg/dL (ref 0.2–1.2)
Total Protein: 7 g/dL (ref 6.1–8.1)

## 2016-07-09 LAB — LIPID PANEL
CHOL/HDL RATIO: 2.2 ratio (ref <5.0)
CHOLESTEROL: 168 mg/dL (ref <200)
HDL: 77 mg/dL (ref >40)
LDL CALC: 82 mg/dL (ref <100)
TRIGLYCERIDES: 45 mg/dL (ref <150)
VLDL: 9 mg/dL (ref <30)

## 2016-07-09 LAB — POCT URINALYSIS DIPSTICK
BILIRUBIN UA: NEGATIVE
GLUCOSE UA: NEGATIVE
Ketones, UA: NEGATIVE
Leukocytes, UA: NEGATIVE
Nitrite, UA: NEGATIVE
Protein, UA: NEGATIVE
SPEC GRAV UA: 1.015 (ref 1.030–1.035)
Urobilinogen, UA: NEGATIVE (ref ?–2.0)
pH, UA: 8 (ref 5.0–8.0)

## 2016-07-09 NOTE — Patient Instructions (Addendum)
  HEALTH MAINTENANCE RECOMMENDATIONS:  It is recommended that you get at least 30 minutes of aerobic exercise at least 5 days/week (for weight loss, you may need as much as 60-90 minutes). This can be any activity that gets your heart rate up. This can be divided in 10-15 minute intervals if needed, but try and build up your endurance at least once a week.  Weight bearing exercise is also recommended twice weekly.  Eat a healthy diet with lots of vegetables, fruits and fiber.  "Colorful" foods have a lot of vitamins (ie green vegetables, tomatoes, red peppers, etc).  Limit sweet tea, regular sodas and alcoholic beverages, all of which has a lot of calories and sugar.  Up to 2 alcoholic drinks daily may be beneficial for men (unless trying to lose weight, watch sugars).  Drink a lot of water.  Sunscreen of at least SPF 30 should be used on all sun-exposed parts of the skin when outside between the hours of 10 am and 4 pm (not just when at beach or pool, but even with exercise, golf, tennis, and yard work!)  Use a sunscreen that says "broad spectrum" so it covers both UVA and UVB rays, and make sure to reapply every 1-2 hours.  Remember to change the batteries in your smoke detectors when changing your clock times in the spring and fall.  Use your seat belt every time you are in a car, and please drive safely and not be distracted with cell phones and texting while driving.  I recommend getting the new shingles vaccine (Shingrix) when available. You will need to check with your insurance to see if it is covered, and if covered by Medicare Part D, you need to get from the pharmacy rather than our office.  It is a series of 2 injections, spaced 2 months apart.   Consider using pepcid AC, zantac or prilosec prior to dinner if/when you are having problems with chest tightness and wheezing in the evenings. It may be related to reflux. You can still use the albuterol if needed.  Call when you need refills  on albuterol inhaler.  Start taking enteric-coated 81mg  aspirin daily.

## 2016-09-02 DIAGNOSIS — D485 Neoplasm of uncertain behavior of skin: Secondary | ICD-10-CM | POA: Diagnosis not present

## 2016-09-02 DIAGNOSIS — C44329 Squamous cell carcinoma of skin of other parts of face: Secondary | ICD-10-CM | POA: Diagnosis not present

## 2016-09-09 DIAGNOSIS — N401 Enlarged prostate with lower urinary tract symptoms: Secondary | ICD-10-CM | POA: Diagnosis not present

## 2016-09-29 DIAGNOSIS — L821 Other seborrheic keratosis: Secondary | ICD-10-CM | POA: Diagnosis not present

## 2016-09-29 DIAGNOSIS — L570 Actinic keratosis: Secondary | ICD-10-CM | POA: Diagnosis not present

## 2016-09-29 DIAGNOSIS — C44329 Squamous cell carcinoma of skin of other parts of face: Secondary | ICD-10-CM | POA: Diagnosis not present

## 2016-09-29 DIAGNOSIS — D1801 Hemangioma of skin and subcutaneous tissue: Secondary | ICD-10-CM | POA: Diagnosis not present

## 2016-09-29 DIAGNOSIS — L814 Other melanin hyperpigmentation: Secondary | ICD-10-CM | POA: Diagnosis not present

## 2016-10-20 DIAGNOSIS — C44329 Squamous cell carcinoma of skin of other parts of face: Secondary | ICD-10-CM | POA: Diagnosis not present

## 2017-02-04 DIAGNOSIS — K200 Eosinophilic esophagitis: Secondary | ICD-10-CM | POA: Diagnosis not present

## 2017-02-04 DIAGNOSIS — R1310 Dysphagia, unspecified: Secondary | ICD-10-CM | POA: Diagnosis not present

## 2017-02-04 DIAGNOSIS — K222 Esophageal obstruction: Secondary | ICD-10-CM | POA: Diagnosis not present

## 2017-02-04 HISTORY — PX: ESOPHAGOGASTRODUODENOSCOPY: SHX1529

## 2017-02-08 DIAGNOSIS — K449 Diaphragmatic hernia without obstruction or gangrene: Secondary | ICD-10-CM | POA: Diagnosis not present

## 2017-02-08 DIAGNOSIS — R1310 Dysphagia, unspecified: Secondary | ICD-10-CM | POA: Diagnosis not present

## 2017-02-08 DIAGNOSIS — K222 Esophageal obstruction: Secondary | ICD-10-CM | POA: Diagnosis not present

## 2017-02-08 DIAGNOSIS — K200 Eosinophilic esophagitis: Secondary | ICD-10-CM | POA: Diagnosis not present

## 2017-02-08 HISTORY — PX: ESOPHAGEAL DILATION: SHX303

## 2017-02-08 HISTORY — PX: ESOPHAGOGASTRODUODENOSCOPY ENDOSCOPY: SHX5814

## 2017-02-10 ENCOUNTER — Encounter: Payer: Self-pay | Admitting: Family Medicine

## 2017-02-11 ENCOUNTER — Encounter: Payer: Self-pay | Admitting: *Deleted

## 2017-02-12 ENCOUNTER — Encounter: Payer: Self-pay | Admitting: Family Medicine

## 2017-02-17 ENCOUNTER — Encounter: Payer: Self-pay | Admitting: Family Medicine

## 2017-02-24 ENCOUNTER — Telehealth: Payer: Self-pay | Admitting: Family Medicine

## 2017-02-24 NOTE — Telephone Encounter (Signed)
Patient advised.

## 2017-02-24 NOTE — Telephone Encounter (Signed)
It is only certain lots--have him check with his pharmacy please

## 2017-02-24 NOTE — Telephone Encounter (Signed)
Pt states saw on news something about Losartan causes cancer and something on a recall, was concerned and wanted a call back regarding this

## 2017-02-26 ENCOUNTER — Other Ambulatory Visit: Payer: Self-pay | Admitting: Family Medicine

## 2017-02-26 DIAGNOSIS — I10 Essential (primary) hypertension: Secondary | ICD-10-CM

## 2017-04-21 ENCOUNTER — Ambulatory Visit (INDEPENDENT_AMBULATORY_CARE_PROVIDER_SITE_OTHER): Payer: BLUE CROSS/BLUE SHIELD | Admitting: Family Medicine

## 2017-04-21 ENCOUNTER — Encounter: Payer: Self-pay | Admitting: Family Medicine

## 2017-04-21 VITALS — BP 122/70 | HR 60 | Temp 97.8°F | Ht 68.25 in | Wt 170.0 lb

## 2017-04-21 DIAGNOSIS — R05 Cough: Secondary | ICD-10-CM

## 2017-04-21 DIAGNOSIS — J069 Acute upper respiratory infection, unspecified: Secondary | ICD-10-CM | POA: Diagnosis not present

## 2017-04-21 DIAGNOSIS — R059 Cough, unspecified: Secondary | ICD-10-CM

## 2017-04-21 NOTE — Patient Instructions (Addendum)
I do not see any evidence of a bacterial infection on today's exam (no sinus infection, bronchitis, pneumonia).  Drink plenty of water. Continue to use nasal saline spray to help with any bleeding and crusting in the nostrils.  To help with the sinus congestion, I recommend trying either NeilMed Sinus Rinse Kit or Neti-pot (use distilled or boiled water).  Try this once or twice a day, to see if it helps with the sinus congestion, postnasal drainage and cough. Consider retrying Mucinex (guaifenesin)--expectorant to help loosen up the mucus, allowing it to drain easier from the sinuses and throat/chest.  Contact us if you develop high fevers, persistently discolored mucus or phlegm, worsening cough or shortness of breath.  Continue to use albuterol if needed for any wheezing or chest tightness.

## 2017-04-21 NOTE — Progress Notes (Signed)
Chief Complaint  Patient presents with  . Cough    right after Christmas started with scratchy throat and then cough started. Had a low grade temp. Dark brown-greenish mucus. Sinuses are very stuffed up.     Started with scratchy throat and cough, shortly after Christmas. He had very low-grade fever (98.8 "fever for me"), mainly early on.  Sometimes he still feels flushed in his face. He has persistent cough. He has head/sinus congestion, postnasal drainage. He can't blow much out of his nose now, sometimes it is bloody and yellow-green.  The phlegm he expectorates is dark brown---mostly gets up phlegm in the morning, not much later in the day. He feels some plugging in his ears, but no pain (especially when he flies his plane). Denies headaches, just dull across the whole head.  No sinus pain, teeth pain.  He uses cough drops, and uses hycodan syrup at bedtime intermittently. He still has a lot left from a bottle from 2017.  Uses a teaspoon at bedtime if he is coughing a lot at night, and it seems to be effective. He uses saline spray, thinks it helps some.  Not doing sinus rinses.  Only on Christmas (at his son's house) he needed to use his inhaler.  He needed it on Christmas the last two years, no other time (just shortly after Christmas last year).  He no longer uses it prior to exercise, didn't notice much help. He coughs after he finishes exercise, doesn't limit his exercise.  He is still able to play tennis 3x/week, though cut back on some of his time at the gym.  PMH, PSH, SH reviewed  Outpatient Encounter Medications as of 04/21/2017  Medication Sig Note  . Cholecalciferol (VITAMIN D-3) 1000 units CAPS Take 2 capsules by mouth daily.   . Coenzyme Q10 (COQ-10) 100 MG CAPS Take 1 capsule by mouth daily.   . fish oil-omega-3 fatty acids 1000 MG capsule Take 1 g by mouth daily.    Marland Kitchen HYDROcodone-homatropine (HYCODAN) 5-1.5 MG/5ML syrup Take 5 mLs by mouth every 6 (six) hours as needed for  cough. 04/21/2017: Uses prn--used last night (rx from 2017)  . losartan (COZAAR) 50 MG tablet TAKE 1 TABLET EVERY DAY   . Pyridoxine HCl (VITAMIN B-6) 500 MG tablet Take 500 mg by mouth daily.     . Turmeric 500 MG CAPS Take 1 capsule by mouth daily.   . vitamin B-12 (CYANOCOBALAMIN) 1000 MCG tablet Take 1,000 mcg by mouth daily.   . vitamin C (ASCORBIC ACID) 500 MG tablet Take 500 mg by mouth daily.   Marland Kitchen albuterol (PROVENTIL HFA;VENTOLIN HFA) 108 (90 BASE) MCG/ACT inhaler Inhale 2 puffs into the lungs every 6 (six) hours as needed for wheezing. Use 30 mins prior to exercise (Patient not taking: Reported on 07/09/2016) 07/09/2016: Hasn't used since Christmas 2017 (related to candles/scents/pet exposure)   No facility-administered encounter medications on file as of 04/21/2017.    No Known Allergies   ROS:  Subjective low grade fever per HPI, resolved.  No nausea, vomiting, diarrhea.  Slight dull headache.  Cough per HPI. No shortness of breath or chest pain.  No bleeding, bruising rash (rarely notes blood when trying to "clear boogers" out of his nose with a Kleenex).  PHYSICAL EXAM:  BP 122/70   Pulse 60   Temp 97.8 F (36.6 C) (Tympanic)   Ht 5' 8.25" (1.734 m)   Wt 170 lb (77.1 kg)   BMI 25.66 kg/m   Well appearing,  pleasant male, with some throat-clearing and rare dry cough.  He is speaking easily, in good spirits. HEENT: conjunctiva and sclera is clear, EOMI.  Nasal mucosa is mild-mod edematous, with dry yellow crusting in the right nares, with recent area of bleed anteriorly.  Clear mucus on the left.  No erythema. Sinuses are nontender. OP is clear TM's and EAC's are normal Neck: No lymphadenopathy or mass Heart: regular rate and rhythm Lungs: clear bilaterally, no wheezes, rales, ronchi Extremities: no edema Skin: normal turgor, no rash Neuro: alert and oriented, cranial nerves intact, normal strength, gait  ASSESSMENT/PLAN:  Upper respiratory tract infection, unspecified type  - consistent with virus, no evidence of bacterial infection. Reviewed s/sx bact infxns and for which he should call or return Supportive measures reviewed  Cough    Drink plenty of water. Continue to use nasal saline spray to help with any bleeding and crusting in the nostrils.  To help with the sinus congestion, I recommend trying either NeilMed Sinus Rinse Kit or Neti-pot (use distilled or boiled water).  Try this once or twice a day, to see if it helps with the sinus congestion, postnasal drainage and cough. Consider retrying Mucinex (guaifenesin)--expectorant to help loosen up the mucus, allowing it to drain easier from the sinuses and throat/chest.  Contact us if you develop high fevers, persistently discolored mucus or phlegm, worsening cough or shortness of breath.  Continue to use albuterol if needed for any wheezing or chest tightness.

## 2017-05-28 ENCOUNTER — Other Ambulatory Visit: Payer: Self-pay | Admitting: Family Medicine

## 2017-05-28 DIAGNOSIS — I10 Essential (primary) hypertension: Secondary | ICD-10-CM

## 2017-05-28 NOTE — Telephone Encounter (Signed)
Pt has an appt on 4./10

## 2017-06-21 ENCOUNTER — Other Ambulatory Visit (INDEPENDENT_AMBULATORY_CARE_PROVIDER_SITE_OTHER): Payer: BLUE CROSS/BLUE SHIELD

## 2017-06-21 DIAGNOSIS — Z23 Encounter for immunization: Secondary | ICD-10-CM | POA: Diagnosis not present

## 2017-06-25 ENCOUNTER — Encounter: Payer: Self-pay | Admitting: Family Medicine

## 2017-06-25 ENCOUNTER — Ambulatory Visit (INDEPENDENT_AMBULATORY_CARE_PROVIDER_SITE_OTHER): Payer: BLUE CROSS/BLUE SHIELD | Admitting: Family Medicine

## 2017-06-25 VITALS — BP 138/80 | HR 55 | Temp 98.4°F | Wt 169.8 lb

## 2017-06-25 DIAGNOSIS — J0140 Acute pansinusitis, unspecified: Secondary | ICD-10-CM

## 2017-06-25 DIAGNOSIS — H7291 Unspecified perforation of tympanic membrane, right ear: Secondary | ICD-10-CM | POA: Diagnosis not present

## 2017-06-25 DIAGNOSIS — H6691 Otitis media, unspecified, right ear: Secondary | ICD-10-CM

## 2017-06-25 MED ORDER — AMOXICILLIN-POT CLAVULANATE 875-125 MG PO TABS: 1.0000 | ORAL_TABLET | Freq: Two times a day (BID) | ORAL | 0 refills | Status: DC

## 2017-06-25 NOTE — Patient Instructions (Signed)
Eardrum Perforation The eardrum is a thin, round tissue inside the ear. It allows you to hear. The eardrum can get torn (perforated). Eardrums often heal on their own. There is often little or no long-term hearing loss. Follow these instructions at home:  Keep your ear dry while it heals. Do not let your head go under water. Do not swim or dive until your doctor says it is okay.  Before you take a bath or shower, do one of these things to keep water out of your ear: ? Put a waterproof earplug in your ear. ? Put petroleum jelly all over a cotton ball. Put the cotton ball in your ear.  Take medicines only as told by your doctor.  Avoid blowing your nose if you can. If you blow your nose, do it gently.  Continue your normal activities after your eardrum heals. Your doctor will tell you when your eardrum has healed.  Talk to your doctor before you fly on an airplane.  Keep all doctor follow-up visits as told by your doctor. This is important. Contact a doctor if:  You have a fever. Get help right away if:  You have blood or yellowish-white fluid (pus) coming from your ear.  You feel dizzy or off balance.  You feel sick to your stomach (nauseous), or you throw up (vomit).  You have more pain. This information is not intended to replace advice given to you by your health care provider. Make sure you discuss any questions you have with your health care provider. Document Released: 09/17/2009 Document Revised: 09/05/2015 Document Reviewed: 11/06/2013 Elsevier Interactive Patient Education  2018 [employer]  

## 2017-06-25 NOTE — Progress Notes (Signed)
Chief Complaint  Patient presents with  . other    cough, congestion, sore throat, ears clogged and having pain with red draining in right ear. started monday after flu shot.    Subjective:  Wayne Brandt is a 67 y.o. male who presents for an acute illness.   States he got a flu shot Monday and since then he has had a scratchy throat, nasal congestion, severe sinus pressure, frontal headache, ears clogged, and post nasal drainage. Not really coughing very often.  States last night he had a stabbing pain in his right ear before it popped and red fluid ran out onto his pillow. States pain improved immediately after. He does have some tinnitus since, not sure if this is new or had this previously.  States he  took Mucinex and Aleve and was able to get relief to sleep. States he has been using a saline nasal rinse and getting a lot of yellowish mucus out.  Denies fever, chills, dizziness, body aches, chest pain, shortness of breath, abdominal pain, N/V/D.   Denies sick contacts.  No other aggravating or relieving factors.  No other c/o.  ROS as in subjective.   Objective: Vitals:   06/25/17 1441  BP: 138/80  Pulse: (!) 55  Temp: 98.4 F (36.9 C)  SpO2: 99%    General appearance: Alert, WD/WN, no distress, mildly ill appearing                             Skin: warm, no rash                           Head: + maxillary and ethmoid sinus tenderness                            Eyes: conjunctiva normal, corneas clear, PERRLA                            Ears: right TM with perforation medially, erythema and dried blood, left TM is intact with erythema but landmarks are visulized, external ear canals normal                          Nose: septum midline, turbinates swollen, with erythema and thick discharge             Mouth/throat: MMM, tongue normal, mild pharyngeal erythema                           Neck: supple, no adenopathy, no thyromegaly, nontender                          Heart:  RRR, normal S1, S2, no murmurs                         Lungs: CTA bilaterally, no wheezes, rales, or rhonchi      Assessment: Acute otitis media of right ear with perforated tympanic membrane  Acute pansinusitis, recurrence not specified    Plan: Discussed diagnosis and treatment acute sinusitis and acute otitis media with perforated TM. Augmentin prescribed. 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 He  can try putting a cotton ball with vaseline as a barrier if he showers.  He will try Mucinex DM for congestion.  Tylenol for pain.   Return on Monday for a follow up with Dr. Tomi Bamberger to ensure proper healing.

## 2017-06-27 NOTE — Progress Notes (Signed)
Chief Complaint  Patient presents with  . Follow-up    seen ofr right ear pain, then Friday night left ear began hurting same way. Here to follow up. Has loud ringing in both ears and is very stopped up and can't hear much.     Patient presents for 3 day follow up. He saw Vickie on Friday, diagnosed with sinusitis and otitis, with perforated R TM. He was placed on Augmentin.  He reports Thursday night he had severe pain in the right ear, felt better after fluid leaked out. Friday night the exact same thing happened to his left ear.  That night he noted bloody drainage from the left ear. Taking Mucinex DM BID and Augmentin BID.  Denies pain, but has loud ringing in his ears and decreased hearing.  No further drainage.  PMH, PSH. SH reviewed  Outpatient Encounter Medications as of 06/28/2017  Medication Sig Note  . amoxicillin-clavulanate (AUGMENTIN) 875-125 MG tablet Take 1 tablet by mouth 2 (two) times daily.   . Cholecalciferol (VITAMIN D-3) 1000 units CAPS Take 2 capsules by mouth daily.   . Coenzyme Q10 (COQ-10) 100 MG CAPS Take 1 capsule by mouth daily.   Marland Kitchen dextromethorphan-guaiFENesin (MUCINEX DM) 30-600 MG 12hr tablet Take 1 tablet by mouth 2 (two) times daily.   . fish oil-omega-3 fatty acids 1000 MG capsule Take 1 g by mouth daily.    Marland Kitchen losartan (COZAAR) 50 MG tablet TAKE 1 TABLET BY MOUTH EVERY DAY   . Pyridoxine HCl (VITAMIN B-6) 500 MG tablet Take 500 mg by mouth daily.     . Turmeric 500 MG CAPS Take 1 capsule by mouth daily.   . vitamin B-12 (CYANOCOBALAMIN) 1000 MCG tablet Take 1,000 mcg by mouth daily.   . vitamin C (ASCORBIC ACID) 500 MG tablet Take 500 mg by mouth daily.   Marland Kitchen albuterol (PROVENTIL HFA;VENTOLIN HFA) 108 (90 BASE) MCG/ACT inhaler Inhale 2 puffs into the lungs every 6 (six) hours as needed for wheezing. Use 30 mins prior to exercise (Patient not taking: Reported on 07/09/2016) 07/09/2016: Hasn't used since Christmas 2017 (related to candles/scents/pet exposure)  .  HYDROcodone-homatropine (HYCODAN) 5-1.5 MG/5ML syrup Take 5 mLs by mouth every 6 (six) hours as needed for cough. 04/21/2017: Uses prn--used last night (rx from 2017)   No facility-administered encounter medications on file as of 06/28/2017.    ROS: No fever, chills, nausea, vomiting, diarrhea, no bleeding, bruising or rashes. +decreased hearing and loud tinnitus per HPI. No dizziness or dysequilibrium.    PHYSICAL EXAM:  BP (!) 150/90   Pulse 60   Temp 97.6 F (36.4 C) (Oral)   Ht 5' 8.25" (1.734 m)   Wt 168 lb 6.4 oz (76.4 kg)   BMI 25.42 kg/m   Well appearing male, in no distress, but bothered by the loud ringing and decreased hearing (slightly agitated); in no distress/pain HEENT: PERRL, EOMI, conjunctiva and sclera are clear.  Nasal mucosa is mildly edematous, no erythema or drainage Sinuses are nontender. OP is clear Right EAC--small amount of blood in canal. TM has small rupture, +erythema of TM Left EAC--filled with blood and debris. The visualized portion of the left TM was thickened, red, and rupture also noted. Neck: no lymphadenopathy or mass Heart: regular rate and rhythm Lungs: clear bilaterally Neuro: alert and oriented, cranial nerves intact (other than hearing). Normal gait Skin: normal turgor, no rash  ASSESSMENT/PLAN:  Bilateral otitis media with spontaneous rupture of eardrum - with hearing loss, tinnitus. He is very  concerned, and would appreciate ENT eval sooner rather than later. Complete current course of ABX - Plan: Ambulatory referral to ENT  Tinnitus of both ears - Plan: Ambulatory referral to ENT  Bilateral hearing loss, unspecified hearing loss type - Plan: Ambulatory referral to ENT    Bilateral otitis media with perforated TM's, with hearing loss and loud tinnitus. Complete course of antibiotics, and f/u with ENT.

## 2017-06-28 ENCOUNTER — Encounter: Payer: Self-pay | Admitting: Family Medicine

## 2017-06-28 ENCOUNTER — Ambulatory Visit (INDEPENDENT_AMBULATORY_CARE_PROVIDER_SITE_OTHER): Payer: BLUE CROSS/BLUE SHIELD | Admitting: Family Medicine

## 2017-06-28 VITALS — BP 150/90 | HR 60 | Temp 97.6°F | Ht 68.25 in | Wt 168.4 lb

## 2017-06-28 DIAGNOSIS — H9193 Unspecified hearing loss, bilateral: Secondary | ICD-10-CM | POA: Diagnosis not present

## 2017-06-28 DIAGNOSIS — H9313 Tinnitus, bilateral: Secondary | ICD-10-CM

## 2017-06-28 DIAGNOSIS — H7293 Unspecified perforation of tympanic membrane, bilateral: Secondary | ICD-10-CM | POA: Diagnosis not present

## 2017-06-28 DIAGNOSIS — H6693 Otitis media, unspecified, bilateral: Secondary | ICD-10-CM | POA: Diagnosis not present

## 2017-06-28 NOTE — Patient Instructions (Signed)
We are referring you to ENT for follow up on your ear infections.  Both of your eardrums have ruptured.  Complete the course of oral antibiotics.  If you have recurrent fever or pain, you will need to be seen sooner, rather than later. Otherwise, hoping that your oral antibiotics are treating the infection.  It may take weeks for the eardrums to heal.   Eardrum Rupture, Adult An eardrum rupture is a hole (perforation) in the eardrum. The eardrum is a thin, round tissue inside of the ear that separates the ear canal from the middle ear. The eardrum is also called the tympanic membrane. It transfers sound vibrations through small bones in the middle ear to the hearing nerve in the inner ear. It also protects the middle ear from germs. An eardrum rupture can cause pain and hearing loss. What are the causes? This condition may be caused by:  An infection.  A sudden injury, such as from: ? Inserting a thin, sharp object into the ear. ? A hit to the side of the head, especially by an open hand. ? Falling onto water or a flat surface. ? A rapid change in pressure, such as from flying or scuba diving. ? A sudden increase in pressure against the eardrum, such as from an explosion or a very loud noise.  Inserting a cotton-tipped swab in the ear.  A long-term eustachian tube disorder. Eustachian tubes are parts of the body that connect each middle ear space to the back of the nose.  A medical procedure or surgery, such as a procedure to remove wax from the ear canal.  Removing a man-made pressure equalization tube(PE tube) that was placed through the eardrum.  Having a PE tube fall out.  What increases the risk? You are more likely to develop this condition if:  You have had PE tubes inserted in your ears.  You have an ear infection.  You play sports that: ? Involve balls or contact with other players. ? Take place in water, such as diving, scuba diving, or waterskiing.  What are the  signs or symptoms? Symptoms of this condition include:  Sudden pain at the time of the injury.  Ear pain that suddenly improves.  Ringing in the ear after the injury.  Drainage from the ear. The drainage may be clear, cloudy or pus-like, or bloody.  Hearing loss.  Dizziness.  How is this diagnosed? This condition is diagnosed based on your symptoms and medical history as well as a physical exam. Your health care provider can usually see a perforation using an ear scope (otoscope). You may have tests, such as:  A hearing test (audiogram) to check for hearing loss.  A test in which a sample of ear drainage is tested for infection (culture).  How is this treated? An eardrum typically heals on its own within a few weeks. If your eardrum does not heal, your health care provider may recommend a procedure to place a patch over your eardrum or surgery to repair your eardrum. Your health care provider may also prescribe antibiotic medicines to help prevent infection. If the ear heals completely, any hearing loss should be temporary. Follow these instructions at home:  Keep your ear dry. This is very important. Follow instructions from your health care provider about how to keep your ear dry. You may need to wear waterproof earplugs when bathing and swimming.  Take over-the-counter and prescription medicines only as told by your health care provider.  Return to sports  and activities as told by your health care provider. Ask your health care provider what activities are safe for you.  Wear headgear with ear protection when you play sports in which ear injuries are common.  If directed, apply heat to your affected ear as often as told by your health care provider. Use the heat source that your health care provider recommends, such as a moist heat pack or a heating pad. This will help to relieve pain. ? Place a towel between your skin and the heat source. ? Leave the heat on for 20-30  minutes. ? Remove the heat if your skin turns bright red. This is especially important if you are unable to feel pain, heat, or cold. You may have a greater risk of getting burned.  Keep all follow-up visits as told by your health care provider. This is important.  Talk to your health care provider before traveling by plane. Contact a health care provider if:  You have mucus or blood draining from your ear.  You have a fever.  You have ear pain.  You have hearing loss, dizziness, or ringing in your ear. Get help right away if:  You have sudden hearing loss.  You are very dizzy.  You have severe ear pain.  Your face feels weak or becomes paralyzed. Summary  An eardrum rupture is a hole (perforation) in the eardrum that can cause pain and hearing loss. It is usually caused by a sudden injury to the ear.  The eardrum will likely heal on its own within a few weeks. In some cases, surgery may be necessary.  After the injury, follow instructions from your health care provider about how to keep your ear dry as it heals. This information is not intended to replace advice given to you by your health care provider. Make sure you discuss any questions you have with your health care provider. Document Released: 03/27/2000 Document Revised: 06/05/2016 Document Reviewed: 06/05/2016 [employer] Interactive Patient Education  [employer].

## 2017-06-29 DIAGNOSIS — H903 Sensorineural hearing loss, bilateral: Secondary | ICD-10-CM | POA: Insufficient documentation

## 2017-06-29 DIAGNOSIS — H73013 Bullous myringitis, bilateral: Secondary | ICD-10-CM | POA: Diagnosis not present

## 2017-06-29 DIAGNOSIS — H900 Conductive hearing loss, bilateral: Secondary | ICD-10-CM | POA: Diagnosis not present

## 2017-06-29 DIAGNOSIS — H9223 Otorrhagia, bilateral: Secondary | ICD-10-CM | POA: Diagnosis not present

## 2017-07-13 DIAGNOSIS — H73013 Bullous myringitis, bilateral: Secondary | ICD-10-CM | POA: Diagnosis not present

## 2017-07-13 DIAGNOSIS — T7840XA Allergy, unspecified, initial encounter: Secondary | ICD-10-CM | POA: Diagnosis not present

## 2017-07-20 NOTE — Progress Notes (Signed)
Chief Complaint  Patient presents with  . Annual Exam    fasting annual exam, is going to schedule eye appt. No new concerns. Left ear is still a concern-still ringing. Hearing has improved but not 100%.    Wayne Brandt is a 67 y.o. male who presents for a complete physical.  He has the following concerns:  Seen in March with bilateral otitis media and hearing loss.  He was treated with Augmentin, and z-pak and drops were added by ENT (Dr. 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  Had f/u 4/2, improving.   He reports that his left ear aches some at night, some ringing.  The right ones feels better.  BPH: s/p TURP 05/2016 by Dr. Karsten Ro. His incontinence post-surgically has completely resolved. Peyronie's disease and erectile issues.  Asymptomatic with respect to Peyronie's. No treatment was recommended.  He is not in a sexual relationship with his wife (due to her health issues, fibromyalgia, etc.). He only follows with him prn (not yearly).  Hypertension:  He was started on losartan by Dr. Redmond School in November for high blood pressures. BP at [facility] 150/85, came down to 140's on recheck. He has only checked it twice.  He hasn't taken his medication yet this morning (he forgot).  Only rarely forgets.  Denies headaches, dizziness, chest pain, or side effects.  BP Readings from Last 3 Encounters:  06/28/17 (!) 150/90  06/25/17 138/80  04/21/17 122/70   H/o RAD/asthma. He sometimes has problems in the evening, or when he is at someone else's house, related to odors, scents. Never related to exercise.  Last needed albuterol at his son's house on Christmas night (just like the year prior).  Denies heartburn(very rare).  Eosinophilic esophagitis: He had been having intermittent episodes of dysphagia. Certain foods were harder to swallow--meats, hard broccoli.  He last saw Dr. Earlean Shawl in October 2018, had EGD (showing esophageal stricture and hiatal hernia) and had dilation.  Symptoms improved, but  if he eats too fast, or certain dry foods, he has more symptoms. He has previously been prescribed an inhaled steroid (Flovent), directed to SWALLOW, to help with esophageal symptoms.He stopped taking it because it was expensive and didn't help at all. No biopsies were done with last procedure.  Aortic atherosclerosis:  Noted on CXR in 2017.  Last year it was recommended that he start taking ASA 35m. He did it for a while but not recently. Didn't have side effects. He declined statin.  HTN is now being treated, for risk factor modification, per above.  Vitamin D deficiency (mild):  Level was low at 27 lin 2016, normal (33) in 2017. He is currently taking 1 daily, unsure of the dose.  Other doctors caring for patient include: ENT: Dr. BRedmond BasemanUrologist: Dr. OKarsten RoDentist: Dr. KDeatra InaDermatologist: Dr. HRenda RollsCardiologist: Dr. HPercival SpanishGI: Dr. MEarlean ShawlOrtho: Dr. AWynelle Link He doesn't have Living Will or Healthcare power of attorney. He got the paperwork last year, but didn't fill it out yet.   Immunization History  Administered Date(s) Administered  . Influenza Whole 01/05/2008  . Influenza, High Dose Seasonal PF 02/06/2016, 06/21/2017  . Influenza,inj,Quad PF,6+ Mos 02/01/2013, 05/28/2015  . Pneumococcal Conjugate-13 07/04/2015  . Pneumococcal Polysaccharide-23 06/01/2007, 07/09/2016  . Td 06/20/1997  . Tdap 01/05/2008  . Zoster 02/25/2012    Last colonoscopy: 02/2014; pt states told 10 years Last PSA: 06/2015 Dentist: twice yearly  Ophtho: yearly Exercise: daily (elliptical, bike), 30-40 minutes daily. Tennis, golf. Exercises daily. Backed off on weights due  to shoulder pain Lipids: Lab Results  Component Value Date   CHOL 168 07/09/2016   HDL 77 07/09/2016   LDLCALC 82 07/09/2016   TRIG 45 07/09/2016   CHOLHDL 2.2 07/09/2016    Depression screen: negative Fall screen: occasionally trips, no serious falls or injuries Functional Status Survey: notable for  hearing loss (per HPI) Mini-Cog screen: normal  He doesn't have Living Will or Healthcare Power of Candie Chroman was given last year   Past Medical History:  Diagnosis Date  . Actinic keratosis    Dr. Renda Rolls  . Arthritis    wrists, L thumb  . BPH (benign prostatic hyperplasia)   . ED (erectile dysfunction)   . Family history of premature CAD   . HH (hiatus hernia)   . History of basal cell carcinoma excision    2013-- chest area  . Hypertension   . Lower urinary tract symptoms (LUTS)   . Mild intermittent asthma   . Peyronie's disease   . Polio    dx 1956  effected left leg  . S/P dilatation of esophageal stricture    multiple times  . Urinary retention     Past Surgical History:  Procedure Laterality Date  . ANKLE FUSION Left 1997  . BAND HEMORRHOIDECTOMY     Dr. Earlean Shawl  . CARDIOVASCULAR STRESS TEST  2006   normal--Dr. Percival Spanish  . COLONOSCOPY, ESOPHAGOGASTRODUODENOSCOPY (EGD) AND ESOPHAGEAL DILATION  02/15/2014   Dr.Medoff  . ESOPHAGEAL DILATION  02/08/2017   Dr. Earlean Shawl.  EGD showed stricture and HH  . ESOPHAGOGASTRODUODENOSCOPY  02/04/2017  . ESOPHAGOGASTRODUODENOSCOPY ENDOSCOPY  02/08/2017  . I & D RIGHT KNEE W/ REMOVAL PATELLA WIRES  02/09/2007  . KNEE ARTHROSCOPY Right 07/07/2000   post-op  07-09-2000  right knee arthroscopy w/ lavage and debridement hemathrosis/ pyathrosis  . LEFT FOOT SURGERY  x2  as child   polio  . NEUROPLASTY / TRANSPOSITION ULNAR NERVE AT ELBOW Right 01/04/2008  . ORIF RIGHT PERIPROSTHEITC PATELLA FX  11/01/2006  . TOTAL KNEE ARTHROPLASTY Right 02/22/2006  . TRANSURETHRAL RESECTION OF PROSTATE N/A 06/08/2016   Procedure: TRANSURETHRAL RESECTION OF THE PROSTATE (TURP);  Surgeon: Kathie Rhodes, MD;  Location: [facility];  Service: Urology;  Laterality: N/A;  . WRIST ARTHROSCOPY WITH DEBRIDEMENT Right 03/30/2001    Social History   Socioeconomic History  . Marital status: Married    Spouse name: Not on file   . Number of children: 1  . Years of education: Not on file  . Highest education level: Not on file  Occupational History  . 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 Financial resource strain: Not on file  . Food insecurity:    Worry: Not on file    Inability: Not on file  . Transportation needs:    Medical: Not on file    Non-medical: Not on file  Tobacco Use  . Smoking status: Never Smoker  . Smokeless tobacco: Never Used  Substance and Sexual Activity  . Alcohol use: Yes    Comment: 1-2 beers/day  . Drug use: No  . Sexual activity: Not Currently    Partners: Female  Lifestyle  . Physical activity:    Days per week: Not on file    Minutes per session: Not on file  . Stress: Not on file  Relationships  . Social connections:    Talks on phone: Not on file    Gets together: Not on  file    Attends religious service: Not on file    Active member of club or organization: Not on file    Attends meetings of clubs or organizations: Not on file    Relationship status: Not on file  . Intimate partner violence:    Fear of current or ex partner: Not on file    Emotionally abused: Not on file    Physically abused: Not on file    Forced sexual activity: Not on file  Other Topics Concern  . Not on file  Social History Narrative   Lives with wife. Son from a previous marriage lives in [place].  1 granddaughter. Cut back to working part-time.    Family History  Problem Relation Age of Onset  . Alcohol abuse Mother   . Cancer Mother        male cancer in 70's  . Heart disease Father 15       MI  . Heart disease Brother 75       MI  . Glaucoma Brother   . Heart disease Brother 48       stents  . Diabetes Neg Hx   . Colon cancer Neg Hx     Outpatient Encounter Medications as of 07/21/2017  Medication Sig Note  . Cholecalciferol (VITAMIN D-3) 1000 units CAPS Take 2 capsules by mouth daily. 07/21/2017: Takes one a day, unsure of  dose  . Coenzyme Q10 (COQ-10) 100 MG CAPS Take 1 capsule by mouth daily.   . fish oil-omega-3 fatty acids 1000 MG capsule Take 1 g by mouth daily.    Marland Kitchen losartan (COZAAR) 50 MG tablet TAKE 1 TABLET BY MOUTH EVERY DAY   . Pyridoxine HCl (VITAMIN B-6) 500 MG tablet Take 500 mg by mouth daily.     . Turmeric 500 MG CAPS Take 1 capsule by mouth daily.   . vitamin B-12 (CYANOCOBALAMIN) 1000 MCG tablet Take 1,000 mcg by mouth daily.   . vitamin C (ASCORBIC ACID) 500 MG tablet Take 500 mg by mouth daily.   Marland Kitchen albuterol (PROVENTIL HFA;VENTOLIN HFA) 108 (90 BASE) MCG/ACT inhaler Inhale 2 puffs into the lungs every 6 (six) hours as needed for wheezing. Use 30 mins prior to exercise (Patient not taking: Reported on 07/09/2016) 07/21/2017: Uses rarely, last needed 03/2017 (Christmas as his son's)  . aspirin EC 81 MG tablet Take 81 mg by mouth daily.   . [DISCONTINUED] amoxicillin-clavulanate (AUGMENTIN) 875-125 MG tablet Take 1 tablet by mouth 2 (two) times daily.   . [DISCONTINUED] dextromethorphan-guaiFENesin (MUCINEX DM) 30-600 MG 12hr tablet Take 1 tablet by mouth 2 (two) times daily.   . [DISCONTINUED] HYDROcodone-homatropine (HYCODAN) 5-1.5 MG/5ML syrup Take 5 mLs by mouth every 6 (six) hours as needed for cough. 04/21/2017: Uses prn--used last night (rx from 2017)   No facility-administered encounter medications on file as of 07/21/2017.     No Known Allergies   ROS: The patient denies anorexia, fever, headaches, vision loss, hoarseness, chest pain, palpitations, dizziness, syncope, dyspnea on exertion, swelling, nausea, vomiting, diarrhea, constipation, abdominal pain, melena, hematochezia, indigestion/heartburn, hematuria, dysuria, weakness, tremor, suspicious skin lesions (sees derm, Dr. Renda Rolls regularly), depression, anxiety, abnormal bleeding/bruising, or enlarged lymph nodes  H/o microscopic hematuria--no visible blood per pt  No rectal bleeding since getting hemorrhoidal banding  Occasional  tingling R hand from ulnar nerve issue (chronic)--intermittent, overall doing better. Thinks the B6 he takes helps, not bothering him recently. Worse in cold weather. No weakness. Occasional L thumb pain, worse with  golfing (known arthritis), occasional bilateral wrist pain (can no longer do pushups) Up 3x/night to void. Denies hesitancy, weakened stream. +intermittent dysphagia, improved s/p dilation in October Hearing loss/plugging per HPI. Sees dermatology at least once a year. Had [place] removed last year   PHYSICAL EXAM:  BP (!) 150/90   Pulse (!) 56   Ht 5' 8" (1.727 m)   Wt 162 lb 6.4 oz (73.7 kg)   BMI 24.69 kg/m   154/78 on repeat by MD  Wt Readings from Last 3 Encounters:  07/21/17 162 lb 6.4 oz (73.7 kg)  06/28/17 168 lb 6.4 oz (76.4 kg)  06/25/17 169 lb 12.8 oz (77 kg)   161# 12.8 oz at his CPE 06/2016   General Appearance:  Alert, cooperative, no distress, appears stated age   Head:  Normocephalic, atraumatic  Eyes:  PERRL, conjunctiva/corneas clear, EOM's intact, fundi benign   Ears:  Dried blood on R TM, normal EAC. Larger amount of dried blood noted in left canal.  TM not visualized. EAC otherwise appears normal. nontender  Nose:  Nares with mild mucosal edema,  no erythema or purulence, no sinus tenderness   Throat:  Lips, mucosa, and tongue normal; teeth and gums normal.   Neck:  Supple, no lymphadenopathy; thyroid: no enlargement/tenderness/ nodules; no carotid bruit or JVD   Back:  Spine nontender, no curvature, ROM normal, no CVA tenderness   Lungs:  Clear to auscultation bilaterally without wheezes, rales or ronchi; respirations unlabored   Chest Wall:  No tenderness or deformity   Heart:  Regular rate and rhythm, S1 and S2 normal, no murmur, rub or gallop   Breast Exam:  No chest wall tenderness, masses or gynecomastia   Abdomen:  Soft, non-tender, nondistended, normoactive bowel sounds, no masses, no  hepatosplenomegaly   Genitalia:  Deferred to urologist   Rectal:  Deferred to urologist  Extremities:  No clubbing, cyanosis or edema. +leg length discrepancy--left is shorter than right leg (since L ankle fusion per pt). L ankle fused; atrophy of LLE; WHSS R knee, swollen, warm, no erythema, nontender (chronic per pt, ices every night). Thickening and onycholytic changes to many nails.  Pulses:  2+ and symmetric all extremities   Skin:  Skin color, texture, turgor normal, no rashes. Actinic damage throughout, with AK's on hands bilaterally. Discolored toenails. Bruising on hands/forearms, purpura, thin skin  Lymph nodes:  Cervical, supraclavicular, and axillary nodes normal   Neurologic:  CNII-XII intact, normal strength, sensation and gait; reflexes 2+ and symmetric throughout                        Psych:   Normal mood, affect, hygiene and grooming    ASSESSMENT/PLAN:  Annual physical exam - Plan: VITAMIN D 25 Hydroxy (Vit-D Deficiency, Fractures), TSH, CBC with Differential/Platelet, Comprehensive metabolic panel, Lipid panel, PSA, POCT Urinalysis DIP (Proadvantage Device)  Essential hypertension - BP elevated today; monitor elsewhere, low sodium diet. Increase dose if remains elevated - Plan: Comprehensive metabolic panel, Lipid panel  Aortic atherosclerosis (HCC) - restart ASA, get BP under control. Counseled/educated; would not be willing to discuss/start statin if chol higher - Plan: Lipid panel  Family history of heart disease in male family member before age 63  Special screening for malignant neoplasm of prostate - Plan: PSA  Vitamin D deficiency - Plan: VITAMIN D 25 Hydroxy (Vit-D Deficiency, Fractures)  Need for hepatitis C screening test - Plan: Hepatitis C antibody  Immunization due - Plan: Td :  Tetanus/diphtheria >7yo Preservative  free  Senile purpura (HCC)   CBC, c-met, lipid, D, PSA, TSH, hep C Ab  BP Log given, low sodium diet  reviewed Consider getting monitor, bring to [place] to check accuracy if he does.  3 month f/u if no changes made, bring list 1 month if dose increased (and will need b-met at f/u)   Recommended at least 30 minutes of aerobic activity at least 5 days/week, weight-bearing exercise at least 2x/wk; proper sunscreen use reviewed; healthy diet and alcohol recommendations (less than or equal to 2 drinks/day) reviewed; regular seatbelt use; changing batteries in smoke detectors. Immunization recommendations discussed== Td given today; continue yearly high dose flu shots (rec giving earlier in the season, in the Fall). Shingrix recommended. Colonoscopy recommendations reviewed--UTD.

## 2017-07-21 ENCOUNTER — Ambulatory Visit (INDEPENDENT_AMBULATORY_CARE_PROVIDER_SITE_OTHER): Payer: BLUE CROSS/BLUE SHIELD | Admitting: Family Medicine

## 2017-07-21 ENCOUNTER — Encounter: Payer: Self-pay | Admitting: Family Medicine

## 2017-07-21 VITALS — BP 150/90 | HR 56 | Ht 68.0 in | Wt 162.4 lb

## 2017-07-21 DIAGNOSIS — Z23 Encounter for immunization: Secondary | ICD-10-CM

## 2017-07-21 DIAGNOSIS — Z0000 Encounter for general adult medical examination without abnormal findings: Secondary | ICD-10-CM

## 2017-07-21 DIAGNOSIS — D692 Other nonthrombocytopenic purpura: Secondary | ICD-10-CM | POA: Diagnosis not present

## 2017-07-21 DIAGNOSIS — Z1159 Encounter for screening for other viral diseases: Secondary | ICD-10-CM | POA: Diagnosis not present

## 2017-07-21 DIAGNOSIS — I10 Essential (primary) hypertension: Secondary | ICD-10-CM | POA: Diagnosis not present

## 2017-07-21 DIAGNOSIS — Z8249 Family history of ischemic heart disease and other diseases of the circulatory system: Secondary | ICD-10-CM

## 2017-07-21 DIAGNOSIS — I700 Atherosclerosis of aorta: Secondary | ICD-10-CM | POA: Diagnosis not present

## 2017-07-21 DIAGNOSIS — Z125 Encounter for screening for malignant neoplasm of prostate: Secondary | ICD-10-CM | POA: Diagnosis not present

## 2017-07-21 DIAGNOSIS — E559 Vitamin D deficiency, unspecified: Secondary | ICD-10-CM

## 2017-07-21 LAB — POCT URINALYSIS DIP (PROADVANTAGE DEVICE)
BILIRUBIN UA: NEGATIVE mg/dL
Bilirubin, UA: NEGATIVE
Glucose, UA: NEGATIVE mg/dL
LEUKOCYTES UA: NEGATIVE
Nitrite, UA: NEGATIVE
Protein Ur, POC: NEGATIVE mg/dL
Specific Gravity, Urine: 1.015
Urobilinogen, Ur: NEGATIVE
pH, UA: 7.5 (ref 5.0–8.0)

## 2017-07-21 NOTE — Patient Instructions (Addendum)
HEALTH MAINTENANCE RECOMMENDATIONS:  It is recommended that you get at least 30 minutes of aerobic exercise at least 5 days/week (for weight loss, you may need as much as 60-90 minutes). This can be any activity that gets your heart rate up. This can be divided in 10-15 minute intervals if needed, but try and build up your endurance at least once a week.  Weight bearing exercise is also recommended twice weekly.  Eat a healthy diet with lots of vegetables, fruits and fiber.  "Colorful" foods have a lot of vitamins (ie green vegetables, tomatoes, red peppers, etc).  Limit sweet tea, regular sodas and alcoholic beverages, all of which has a lot of calories and sugar.  Up to 2 alcoholic drinks daily may be beneficial for men (unless trying to lose weight, watch sugars).  Drink a lot of water.  Sunscreen of at least SPF 30 should be used on all sun-exposed parts of the skin when outside between the hours of 10 am and 4 pm (not just when at beach or pool, but even with exercise, golf, tennis, and yard work!)  Use a sunscreen that says "broad spectrum" so it covers both UVA and UVB rays, and make sure to reapply every 1-2 hours.  Remember to change the batteries in your smoke detectors when changing your clock times in the spring and fall.  Use your seat belt every time you are in a car, and please drive safely and not be distracted with cell phones and texting while driving.  Please check your blood pressure periodically and record on the sheet given. If you buy a monitor (I recommend [employer] brand), bring it to a visit to have the accuracy checked.  Try and follow a low sodium diet. Goal blood pressure is <130/80.  If it is persistently over 135-140/85-90 then we need to adjust your medication and follow-up.    Low-Sodium Eating Plan Sodium, which is an element that makes up salt, helps you maintain a healthy balance of fluids in your body. Too much sodium can increase your blood pressure and cause  fluid and waste to be held in your body. Your health care provider or dietitian may recommend following this plan if you have high blood pressure (hypertension), kidney disease, liver disease, or heart failure. Eating less sodium can help lower your blood pressure, reduce swelling, and protect your heart, liver, and kidneys. What are tips for following this plan? General guidelines  Most people on this plan should limit their sodium intake to 1,500-2,000 mg (milligrams) of sodium each day. Reading food labels  The Nutrition Facts label lists the amount of sodium in one serving of the food. If you eat more than one serving, you must multiply the listed amount of sodium by the number of servings.  Choose foods with less than 140 mg of sodium per serving.  Avoid foods with 300 mg of sodium or more per serving. Shopping  Look for lower-sodium products, often labeled as "low-sodium" or "no salt added."  Always check the sodium content even if foods are labeled as "unsalted" or "no salt added".  Buy fresh foods. ? Avoid canned foods and premade or frozen meals. ? Avoid canned, cured, or processed meats  Buy breads that have less than 80 mg of sodium per slice. Cooking  Eat more home-cooked food and less restaurant, buffet, and fast food.  Avoid adding salt when cooking. Use salt-free seasonings or herbs instead of table salt or sea salt. Check with your health  care provider or pharmacist before using salt substitutes.  Cook with plant-based oils, such as canola, sunflower, or olive oil. Meal planning  When eating at a restaurant, ask that your food be prepared with less salt or no salt, if possible.  Avoid foods that contain MSG (monosodium glutamate). MSG is sometimes added to [place] food, bouillon, and some canned foods. What foods are recommended? The items listed may not be a complete list. Talk with your dietitian about what dietary choices are best for you. Grains Low-sodium  cereals, including oats, puffed wheat and rice, and shredded wheat. Low-sodium crackers. Unsalted rice. Unsalted pasta. Low-sodium bread. Whole-grain breads and whole-grain pasta. Vegetables Fresh or frozen vegetables. "No salt added" canned vegetables. "No salt added" tomato sauce and paste. Low-sodium or reduced-sodium tomato and vegetable juice. Fruits Fresh, frozen, or canned fruit. Fruit juice. Meats and other protein foods Fresh or frozen (no salt added) meat, poultry, seafood, and fish. Low-sodium canned tuna and salmon. Unsalted nuts. Dried peas, beans, and lentils without added salt. Unsalted canned beans. Eggs. Unsalted nut butters. Dairy Milk. Soy milk. Cheese that is naturally low in sodium, such as ricotta cheese, fresh mozzarella, or Swiss cheese Low-sodium or reduced-sodium cheese. Cream cheese. Yogurt. Fats and oils Unsalted butter. Unsalted margarine with no trans fat. Vegetable oils such as canola or olive oils. Seasonings and other foods Fresh and dried herbs and spices. Salt-free seasonings. Low-sodium mustard and ketchup. Sodium-free salad dressing. Sodium-free light mayonnaise. Fresh or refrigerated horseradish. Lemon juice. Vinegar. Homemade, reduced-sodium, or low-sodium soups. Unsalted popcorn and pretzels. Low-salt or salt-free chips. What foods are not recommended? The items listed may not be a complete list. Talk with your dietitian about what dietary choices are best for you. Grains Instant hot cereals. Bread stuffing, pancake, and biscuit mixes. Croutons. Seasoned rice or pasta mixes. Noodle soup cups. Boxed or frozen macaroni and cheese. Regular salted crackers. Self-rising flour. Vegetables Sauerkraut, pickled vegetables, and relishes. Olives. [place] fries. Onion rings. Regular canned vegetables (not low-sodium or reduced-sodium). Regular canned tomato sauce and paste (not low-sodium or reduced-sodium). Regular tomato and vegetable juice (not low-sodium or  reduced-sodium). Frozen vegetables in sauces. Meats and other protein foods Meat or fish that is salted, canned, smoked, spiced, or pickled. Bacon, ham, sausage, hotdogs, corned beef, chipped beef, packaged lunch meats, salt pork, jerky, pickled herring, anchovies, regular canned tuna, sardines, salted nuts. Dairy Processed cheese and cheese spreads. Cheese curds. Blue cheese. Feta cheese. String cheese. Regular cottage cheese. Buttermilk. Canned milk. Fats and oils Salted butter. Regular margarine. Ghee. Bacon fat. Seasonings and other foods Onion salt, garlic salt, seasoned salt, table salt, and sea salt. Canned and packaged gravies. Worcestershire sauce. Tartar sauce. Barbecue sauce. Teriyaki sauce. Soy sauce, including reduced-sodium. Steak sauce. Fish sauce. Oyster sauce. Cocktail sauce. Horseradish that you find on the shelf. Regular ketchup and mustard. Meat flavorings and tenderizers. Bouillon cubes. Hot sauce and Tabasco sauce. Premade or packaged marinades. Premade or packaged taco seasonings. Relishes. Regular salad dressings. Salsa. Potato and tortilla chips. Corn chips and puffs. Salted popcorn and pretzels. Canned or dried soups. Pizza. Frozen entrees and pot pies. Summary  Eating less sodium can help lower your blood pressure, reduce swelling, and protect your heart, liver, and kidneys.  Most people on this plan should limit their sodium intake to 1,500-2,000 mg (milligrams) of sodium each day.  Canned, boxed, and frozen foods are high in sodium. Restaurant foods, fast foods, and pizza are also very high in sodium. You also get sodium  by adding salt to food.  Try to cook at home, eat more fresh fruits and vegetables, and eat less fast food, canned, processed, or prepared foods. This information is not intended to replace advice given to you by your health care provider. Make sure you discuss any questions you have with your health care provider. Document Released: 09/19/2001  Document Revised: 03/23/2016 Document Reviewed: 03/23/2016 Elsevier Interactive Patient Education  [employer].

## 2017-07-22 DIAGNOSIS — D692 Other nonthrombocytopenic purpura: Secondary | ICD-10-CM | POA: Insufficient documentation

## 2017-07-22 LAB — COMPREHENSIVE METABOLIC PANEL
ALK PHOS: 120 IU/L — AB (ref 39–117)
ALT: 17 IU/L (ref 0–44)
AST: 34 IU/L (ref 0–40)
Albumin/Globulin Ratio: 1.6 (ref 1.2–2.2)
Albumin: 4.6 g/dL (ref 3.6–4.8)
BUN/Creatinine Ratio: 13 (ref 10–24)
BUN: 10 mg/dL (ref 8–27)
Bilirubin Total: 0.3 mg/dL (ref 0.0–1.2)
CO2: 26 mmol/L (ref 20–29)
CREATININE: 0.8 mg/dL (ref 0.76–1.27)
Calcium: 9.3 mg/dL (ref 8.6–10.2)
Chloride: 91 mmol/L — ABNORMAL LOW (ref 96–106)
GFR calc Af Amer: 107 mL/min/{1.73_m2} (ref >59)
GFR calc non Af Amer: 92 mL/min/{1.73_m2} (ref >59)
GLOBULIN, TOTAL: 2.8 g/dL (ref 1.5–4.5)
GLUCOSE: 90 mg/dL (ref 65–99)
Potassium: 4.7 mmol/L (ref 3.5–5.2)
SODIUM: 129 mmol/L — AB (ref 134–144)
Total Protein: 7.4 g/dL (ref 6.0–8.5)

## 2017-07-22 LAB — CBC WITH DIFFERENTIAL/PLATELET
Basophils Absolute: 0 10*3/uL (ref 0.0–0.2)
Basos: 0 %
EOS (ABSOLUTE): 0.2 10*3/uL (ref 0.0–0.4)
Eos: 4 %
Hematocrit: 38.7 % (ref 37.5–51.0)
Hemoglobin: 13 g/dL (ref 13.0–17.7)
Immature Grans (Abs): 0 10*3/uL (ref 0.0–0.1)
Immature Granulocytes: 0 %
LYMPHS ABS: 1.4 10*3/uL (ref 0.7–3.1)
Lymphs: 27 %
MCH: 31.3 pg (ref 26.6–33.0)
MCHC: 33.6 g/dL (ref 31.5–35.7)
MCV: 93 fL (ref 79–97)
MONOS ABS: 0.7 10*3/uL (ref 0.1–0.9)
Monocytes: 13 %
Neutrophils Absolute: 2.9 10*3/uL (ref 1.4–7.0)
Neutrophils: 56 %
PLATELETS: 249 10*3/uL (ref 150–379)
RBC: 4.16 x10E6/uL (ref 4.14–5.80)
RDW: 14.4 % (ref 12.3–15.4)
WBC: 5.2 10*3/uL (ref 3.4–10.8)

## 2017-07-22 LAB — LIPID PANEL
CHOLESTEROL TOTAL: 193 mg/dL (ref 100–199)
Chol/HDL Ratio: 2.2 ratio (ref 0.0–5.0)
HDL: 88 mg/dL (ref >39)
LDL CALC: 96 mg/dL (ref 0–99)
TRIGLYCERIDES: 43 mg/dL (ref 0–149)
VLDL Cholesterol Cal: 9 mg/dL (ref 5–40)

## 2017-07-22 LAB — VITAMIN D 25 HYDROXY (VIT D DEFICIENCY, FRACTURES): Vit D, 25-Hydroxy: 40.7 ng/mL (ref 30.0–100.0)

## 2017-07-22 LAB — PSA: Prostate Specific Ag, Serum: 0.3 ng/mL (ref 0.0–4.0)

## 2017-07-22 LAB — TSH: TSH: 3.68 u[IU]/mL (ref 0.450–4.500)

## 2017-07-22 LAB — HEPATITIS C ANTIBODY: Hep C Virus Ab: <0.1 s/co ratio (ref 0.0–0.9)

## 2017-08-02 ENCOUNTER — Telehealth: Payer: Self-pay | Admitting: *Deleted

## 2017-08-02 ENCOUNTER — Other Ambulatory Visit: Payer: BLUE CROSS/BLUE SHIELD

## 2017-08-02 NOTE — Telephone Encounter (Signed)
Monitor is accurate.  Keep same med and we will see him in July.

## 2017-08-02 NOTE — Telephone Encounter (Signed)
Patient advised.

## 2017-08-02 NOTE — Telephone Encounter (Signed)
Patient was here for bp check nurse visit. His machine was 133/72 and I got 126/70.

## 2017-08-05 ENCOUNTER — Other Ambulatory Visit: Payer: Self-pay

## 2017-09-01 ENCOUNTER — Other Ambulatory Visit: Payer: Self-pay | Admitting: Family Medicine

## 2017-09-01 DIAGNOSIS — I10 Essential (primary) hypertension: Secondary | ICD-10-CM

## 2017-10-01 DIAGNOSIS — H73013 Bullous myringitis, bilateral: Secondary | ICD-10-CM | POA: Diagnosis not present

## 2017-10-01 DIAGNOSIS — H9312 Tinnitus, left ear: Secondary | ICD-10-CM | POA: Diagnosis not present

## 2017-10-06 DIAGNOSIS — D1801 Hemangioma of skin and subcutaneous tissue: Secondary | ICD-10-CM | POA: Diagnosis not present

## 2017-10-06 DIAGNOSIS — L821 Other seborrheic keratosis: Secondary | ICD-10-CM | POA: Diagnosis not present

## 2017-10-06 DIAGNOSIS — Z85828 Personal history of other malignant neoplasm of skin: Secondary | ICD-10-CM | POA: Diagnosis not present

## 2017-10-06 DIAGNOSIS — L570 Actinic keratosis: Secondary | ICD-10-CM | POA: Diagnosis not present

## 2017-10-06 DIAGNOSIS — L814 Other melanin hyperpigmentation: Secondary | ICD-10-CM | POA: Diagnosis not present

## 2017-10-06 DIAGNOSIS — D485 Neoplasm of uncertain behavior of skin: Secondary | ICD-10-CM | POA: Diagnosis not present

## 2017-10-19 NOTE — Progress Notes (Signed)
Chief Complaint  Patient presents with  . Hypertension    nonfasting med check for hypertension.     Hypertension:  He was started on losartan by Dr. Redmond School in November, 2017 for high blood pressures. His BP was elevated at his physical in April, 2019.  He returned for NV, and his monitor was verified as accurate, and BP was okay. He brings in list of BP's, monitoring twice daily at home. BP's have been running 115-136/56-69 (mornings and evenings, no difference). He denies headaches, dizziness, chest pain, shortness of breath, cough, edema.  Some residual ringing in his ears, but otherwise no further ear problems.  Feels like his hearing is back to normal.  Saw ENT in follow-up just a few weeks ago.  Aortic atherosclerosis:  Noted on CXR in 2017.  He was reminded at his physical to resume ASA 81mg . He was taking it, ran out a couple of days ago.  Denies any GI side effects.  Last lipids were good (previously reported not wanting statins): Lab Results  Component Value Date   CHOL 193 07/21/2017   HDL 88 07/21/2017   LDLCALC 96 07/21/2017   TRIG 43 07/21/2017   CHOLHDL 2.2 07/21/2017    PMH, PSH, SH reviewed  Outpatient Encounter Medications as of 10/20/2017  Medication Sig Note  . Cholecalciferol (VITAMIN D-3) 1000 units CAPS Take 2 capsules by mouth daily. 07/21/2017: Takes one a day, unsure of dose  . fish oil-omega-3 fatty acids 1000 MG capsule Take 1 g by mouth daily.    Marland Kitchen losartan (COZAAR) 50 MG tablet TAKE 1 TABLET BY MOUTH EVERY DAY   . Pyridoxine HCl (VITAMIN B-6) 500 MG tablet Take 500 mg by mouth daily.     . vitamin B-12 (CYANOCOBALAMIN) 1000 MCG tablet Take 1,000 mcg by mouth daily.   . vitamin C (ASCORBIC ACID) 500 MG tablet Take 500 mg by mouth daily.   Marland Kitchen albuterol (PROVENTIL HFA;VENTOLIN HFA) 108 (90 BASE) MCG/ACT inhaler Inhale 2 puffs into the lungs every 6 (six) hours as needed for wheezing. Use 30 mins prior to exercise (Patient not taking: Reported on 07/09/2016)  07/21/2017: Uses rarely, last needed 03/2017 (Christmas as his son's)  . aspirin EC 81 MG tablet Take 81 mg by mouth daily. 10/20/2017: Ran out 3 days ago  . naproxen sodium (ALEVE) 220 MG tablet Take 220 mg by mouth. 07/21/2017: Takes 2 aleve about once a week (for pain in hands/wrists)  . [DISCONTINUED] Coenzyme Q10 (COQ-10) 100 MG CAPS Take 1 capsule by mouth daily.   . [DISCONTINUED] Turmeric 500 MG CAPS Take 1 capsule by mouth daily.    No facility-administered encounter medications on file as of 10/20/2017.    No Known Allergies  ROS:  No fever, chills, headaches, dizziness, chest pain, shortness of breath, GI complaints, joint pains, edema, rash. Some easy bruising (on aspirin).  Moods are good. No other concerns/complaints, remainder negative.  See HPI  PHYSICAL EXAM:  BP 130/80   Pulse (!) 56   Ht 5\' 8"  (1.727 m)   Wt 170 lb 9.6 oz (77.4 kg)   BMI 25.94 kg/m    Wt Readings from Last 3 Encounters:  07/21/17 162 lb 6.4 oz (73.7 kg)  06/28/17 168 lb 6.4 oz (76.4 kg)  06/25/17 169 lb 12.8 oz (77 kg)   Well-appearing, pleasant male in no distress HEENT: conjunctiva and sclera are clear, EOMI Neck: no lymphadenopathy, thyromegaly or mass, no bruit Heart: regular rate and rhythm, no murmur Lungs: clear bilaterally,  no wheezes, rales, ronchi Extremities: no edema, normal pulses Skin: normal turgor, no rash. Some bruising noted on hands/forearms Psych: normal mood, affect, hygiene and grooming Neuro: alert and oriented, cranial nerves intact, normal strength, gait    ASSESSMENT/PLAN:  Essential hypertension - well controlled. Continue losartan, low sodium diet and regular exercise  Aortic atherosclerosis ([place]) - discussed diagnosis, need for minimizing risks--good control of blood pressure, taking ASA, control of lipids (okay per last check, not on statin, doesn't want)   F/u at physical in April, sooner prn

## 2017-10-20 ENCOUNTER — Encounter: Payer: Self-pay | Admitting: Family Medicine

## 2017-10-20 ENCOUNTER — Ambulatory Visit (INDEPENDENT_AMBULATORY_CARE_PROVIDER_SITE_OTHER): Payer: BLUE CROSS/BLUE SHIELD | Admitting: Family Medicine

## 2017-10-20 VITALS — BP 130/80 | HR 56 | Ht 68.0 in | Wt 170.6 lb

## 2017-10-20 DIAGNOSIS — I700 Atherosclerosis of aorta: Secondary | ICD-10-CM | POA: Diagnosis not present

## 2017-10-20 DIAGNOSIS — I10 Essential (primary) hypertension: Secondary | ICD-10-CM

## 2017-10-20 NOTE — Patient Instructions (Signed)
You do not need to check your blood pressure as often.   Check it once a month to ensure that it is still good. Prior to your physical, check it for a couple of weeks, and bring the list to your visit. It can be checked just once in a day (not twice), but vary the times, and use the appropriate column on the sheet.

## 2017-11-02 ENCOUNTER — Encounter: Payer: Self-pay | Admitting: Family Medicine

## 2017-12-04 ENCOUNTER — Other Ambulatory Visit: Payer: Self-pay | Admitting: Family Medicine

## 2017-12-04 DIAGNOSIS — I10 Essential (primary) hypertension: Secondary | ICD-10-CM

## 2017-12-06 DIAGNOSIS — R1310 Dysphagia, unspecified: Secondary | ICD-10-CM | POA: Diagnosis not present

## 2017-12-06 DIAGNOSIS — Z9889 Other specified postprocedural states: Secondary | ICD-10-CM

## 2017-12-06 DIAGNOSIS — K449 Diaphragmatic hernia without obstruction or gangrene: Secondary | ICD-10-CM | POA: Diagnosis not present

## 2017-12-06 DIAGNOSIS — K222 Esophageal obstruction: Secondary | ICD-10-CM | POA: Diagnosis not present

## 2017-12-06 DIAGNOSIS — Z800 Family history of malignant neoplasm of digestive organs: Secondary | ICD-10-CM | POA: Diagnosis not present

## 2017-12-06 DIAGNOSIS — K2980 Duodenitis without bleeding: Secondary | ICD-10-CM | POA: Diagnosis not present

## 2017-12-06 DIAGNOSIS — K2970 Gastritis, unspecified, without bleeding: Secondary | ICD-10-CM | POA: Diagnosis not present

## 2017-12-06 HISTORY — DX: Other specified postprocedural states: Z98.890

## 2017-12-08 ENCOUNTER — Encounter: Payer: Self-pay | Admitting: *Deleted

## 2017-12-09 ENCOUNTER — Encounter: Payer: Self-pay | Admitting: Family Medicine

## 2018-02-16 ENCOUNTER — Other Ambulatory Visit (INDEPENDENT_AMBULATORY_CARE_PROVIDER_SITE_OTHER): Payer: BLUE CROSS/BLUE SHIELD

## 2018-02-16 DIAGNOSIS — Z23 Encounter for immunization: Secondary | ICD-10-CM | POA: Diagnosis not present

## 2018-03-09 ENCOUNTER — Ambulatory Visit (INDEPENDENT_AMBULATORY_CARE_PROVIDER_SITE_OTHER): Payer: BLUE CROSS/BLUE SHIELD | Admitting: Orthopedic Surgery

## 2018-03-09 ENCOUNTER — Encounter (INDEPENDENT_AMBULATORY_CARE_PROVIDER_SITE_OTHER): Payer: Self-pay | Admitting: Orthopedic Surgery

## 2018-03-09 ENCOUNTER — Ambulatory Visit (INDEPENDENT_AMBULATORY_CARE_PROVIDER_SITE_OTHER): Payer: Self-pay

## 2018-03-09 VITALS — Ht 69.0 in | Wt 164.0 lb

## 2018-03-09 DIAGNOSIS — M12811 Other specific arthropathies, not elsewhere classified, right shoulder: Secondary | ICD-10-CM | POA: Diagnosis not present

## 2018-03-09 DIAGNOSIS — M75101 Unspecified rotator cuff tear or rupture of right shoulder, not specified as traumatic: Secondary | ICD-10-CM | POA: Diagnosis not present

## 2018-03-09 DIAGNOSIS — G8929 Other chronic pain: Secondary | ICD-10-CM

## 2018-03-09 DIAGNOSIS — M25511 Pain in right shoulder: Secondary | ICD-10-CM | POA: Diagnosis not present

## 2018-03-10 ENCOUNTER — Encounter (INDEPENDENT_AMBULATORY_CARE_PROVIDER_SITE_OTHER): Payer: Self-pay | Admitting: Orthopedic Surgery

## 2018-03-10 NOTE — Progress Notes (Signed)
Office Visit Note   Patient: Wayne Brandt           Date of Birth: 09-07-1950           MRN: [id number] Visit Date: 03/09/2018 Requested by: Rita Ohara, [address] [place], [place] [address] PCP: Rita Ohara, MD  Subjective: Chief Complaint  Patient presents with  . Right Shoulder - Pain    HPI: Tynell is a patient with right shoulder pain.  At the gym several months ago he "pulled something" pressing.  2 Aleve every 4 days.  With his injury he denied any bruising.  He has been able to play tennis.  He states that he had what sounds like a nerve decompression about 40 years ago at [facility].  He does have a history of polio which affects his left leg.              ROS: All systems reviewed are negative as they relate to the chief complaint within the history of present illness.  Patient denies  fevers or chills.   Assessment & Plan: Visit Diagnoses:  1. Chronic right shoulder pain   2. Rotator cuff tear arthropathy of right shoulder     Plan: Impression is right shoulder arthritis and rotator cuff revision.  I think he is exceedingly functional he has full forward flexion to 170 and abduction easily above 90 degrees.  Weak but has full functional range of motion of the shoulder.  Present but I would expect that with how his radiographs look.  For now I would suggest sciatic reactions when pain becomes severe.  His biceps tendon is intact and his subscap is intact.  I will see him back as needed  Follow-Up Instructions: Return if symptoms worsen or fail to improve.   Orders:  Orders Placed This Encounter  Procedures  . XR Shoulder Right   No orders of the defined types were placed in this encounter.     Procedures: No procedures performed   Clinical Data: No additional findings.  Objective: Vital Signs: Ht 5\' 9"  (1.753 m)   Wt 164 lb (74.4 kg)   BMI 24.22 kg/m   Physical Exam:   Constitutional: Patient appears well-developed HEENT:  Head:  Normocephalic Eyes:EOM are normal Neck: Normal range of motion Cardiovascular: Normal rate Pulmonary/chest: Effort normal Neurologic: Patient is alert Skin: Skin is warm Psychiatric: Patient has normal mood and affect    Ortho Exam: Ortho exam demonstrates forward flexion 170 on the right.  Abduction over 90 degrees on the right.  Does have infraspinatus and supraspinatus weakness 4 out of 5 on the right compared to 5 out of 5 on the left.  Subscap strength 5+ out of 5 bilaterally.  No Popeye deformity in the right shoulder.  He does have a little bit of coarseness and grinding with passive range of motion along with atrophy in the infraspinatus fossa.  Specialty Comments:  No specialty comments available.  Imaging: Xr Shoulder Right  Result Date: 03/10/2018 AP outlet axillary right shoulder reviewed.  Early glenohumeral arthritis is present to moderate degree.  AC joint arthropathy also present.  Shoulder is located.  Narrowing of the acromiohumeral distance is present consistent with rotator cuff arthropathy.    PMFS History: Patient Active Problem List   Diagnosis Date Noted  . Senile purpura ([place]) 07/22/2017  . BPH with urinary obstruction 06/08/2016  . Family history of heart disease in male family member before age 16 04/09/2016  . Aortic atherosclerosis ([place])  04/09/2016  . Essential hypertension 04/09/2016  . Vitamin D deficiency 06/29/2014  . Exercise-induced asthma 06/28/2014  . Eosinophilic esophagitis 41/63/8453  . Peyronie disease 06/26/2013  . Allergic rhinitis 06/26/2013  . Microscopic hematuria 12/03/2011   Past Medical History:  Diagnosis Date  . Actinic keratosis    Dr. Renda Rolls  . Arthritis    wrists, L thumb  . BPH (benign prostatic hyperplasia)   . ED (erectile dysfunction)   . Family history of premature CAD   . HH (hiatus hernia)   . History of basal cell carcinoma excision    2013-- chest area  . History of esophagogastroduodenoscopy (EGD)  12/06/2017   Dr.Medoff-distal esophageal stricture,hiatal hernia,gastritis and duodenitis.  . Hypertension   . Lower urinary tract symptoms (LUTS)   . Mild intermittent asthma   . Peyronie's disease   . Polio    dx 1956  effected left leg  . S/P dilatation of esophageal stricture    multiple times  . Urinary retention     Family History  Problem Relation Age of Onset  . Alcohol abuse Mother   . Cancer Mother        male cancer in 64's  . Heart disease Father 63       MI  . Heart disease Brother 5       MI  . Glaucoma Brother   . Heart disease Brother 48       stents  . Diabetes Neg Hx   . Colon cancer Neg Hx     Past Surgical History:  Procedure Laterality Date  . ANKLE FUSION Left 1997  . BAND HEMORRHOIDECTOMY     Dr. Earlean Shawl  . CARDIOVASCULAR STRESS TEST  2006   normal--Dr. Percival Spanish  . COLONOSCOPY, ESOPHAGOGASTRODUODENOSCOPY (EGD) AND ESOPHAGEAL DILATION  02/15/2014   Dr.Medoff  . ESOPHAGEAL DILATION  02/08/2017   Dr. Earlean Shawl.  EGD showed stricture and HH  . ESOPHAGOGASTRODUODENOSCOPY  02/04/2017  . ESOPHAGOGASTRODUODENOSCOPY ENDOSCOPY  02/08/2017  . I & D RIGHT KNEE W/ REMOVAL PATELLA WIRES  02/09/2007  . KNEE ARTHROSCOPY Right 07/07/2000   post-op  07-09-2000  right knee arthroscopy w/ lavage and debridement hemathrosis/ pyathrosis  . LEFT FOOT SURGERY  x2  as child   polio  . NEUROPLASTY / TRANSPOSITION ULNAR NERVE AT ELBOW Right 01/04/2008  . ORIF RIGHT PERIPROSTHEITC PATELLA FX  11/01/2006  . TOTAL KNEE ARTHROPLASTY Right 02/22/2006  . TRANSURETHRAL RESECTION OF PROSTATE N/A 06/08/2016   Procedure: TRANSURETHRAL RESECTION OF THE PROSTATE (TURP);  Surgeon: Kathie Rhodes, MD;  Location: [facility];  Service: Urology;  Laterality: N/A;  . WRIST ARTHROSCOPY WITH DEBRIDEMENT Right 03/30/2001   Social History   Occupational History  . 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 Smoking status: Never  Smoker  . Smokeless tobacco: Never Used  Substance and Sexual Activity  . Alcohol use: Yes    Comment: 1-2 beers/day  . Drug use: No  . Sexual activity: Not Currently    Partners: Female

## 2018-03-16 ENCOUNTER — Ambulatory Visit (INDEPENDENT_AMBULATORY_CARE_PROVIDER_SITE_OTHER): Payer: BLUE CROSS/BLUE SHIELD | Admitting: Orthopedic Surgery

## 2018-04-13 DIAGNOSIS — C4492 Squamous cell carcinoma of skin, unspecified: Secondary | ICD-10-CM

## 2018-04-13 HISTORY — DX: Squamous cell carcinoma of skin, unspecified: C44.92

## 2018-05-18 ENCOUNTER — Encounter: Payer: Self-pay | Admitting: Family Medicine

## 2018-05-18 ENCOUNTER — Ambulatory Visit: Payer: Medicare Other | Admitting: Family Medicine

## 2018-05-18 VITALS — BP 140/80 | HR 56 | Ht 69.0 in | Wt 168.6 lb

## 2018-05-18 DIAGNOSIS — I10 Essential (primary) hypertension: Secondary | ICD-10-CM | POA: Diagnosis not present

## 2018-05-18 DIAGNOSIS — D485 Neoplasm of uncertain behavior of skin: Secondary | ICD-10-CM | POA: Diagnosis not present

## 2018-05-18 DIAGNOSIS — D04112 Carcinoma in situ of skin of right lower eyelid, including canthus: Secondary | ICD-10-CM | POA: Diagnosis not present

## 2018-05-18 DIAGNOSIS — Z23 Encounter for immunization: Secondary | ICD-10-CM | POA: Diagnosis not present

## 2018-05-18 DIAGNOSIS — C44321 Squamous cell carcinoma of skin of nose: Secondary | ICD-10-CM | POA: Diagnosis not present

## 2018-05-18 DIAGNOSIS — C44329 Squamous cell carcinoma of skin of other parts of face: Secondary | ICD-10-CM | POA: Diagnosis not present

## 2018-05-18 NOTE — Patient Instructions (Signed)
Your blood pressures at home have been slightly elevated, but you have also had some very normal values mixed in. Today we discussed the various things that can contribute to higher blood pressures (sodium in the diet, stress, pain, decongestants, poor sleep, sleep apnea, etc).  See information below on low sodium diet. Continue to monitor your blood pressure and record.  If it is high, wait 5-10 minutes, check the blood pressure again, and record both values.  Ask your wife if she ever hears you take long pauses in your breathing (snoring isn't dangerous, but apnea or pauses in breathing can be).  Continue to monitor and record your blood pressures on the sheet provided.  Send these values to [place] within the next 4-6 weeks (you can take a picture and attach it to a MyChart Message to send to [place]).  If you start having headaches, chest pain, shortness of breath, swelling or other concerns, please let us know.   Low-Sodium Eating Plan Sodium, which is an element that makes up salt, helps you maintain a healthy balance of fluids in your body. Too much sodium can increase your blood pressure and cause fluid and waste to be held in your body. Your health care provider or dietitian may recommend following this plan if you have high blood pressure (hypertension), kidney disease, liver disease, or heart failure. Eating less sodium can help lower your blood pressure, reduce swelling, and protect your heart, liver, and kidneys. What are tips for following this plan? General guidelines  Most people on this plan should limit their sodium intake to 1,500-2,000 mg (milligrams) of sodium each day. Reading food labels   The Nutrition Facts label lists the amount of sodium in one serving of the food. If you eat more than one serving, you must multiply the listed amount of sodium by the number of servings.  Choose foods with less than 140 mg of sodium per serving.  Avoid foods with 300 mg of sodium or more per  serving. Shopping  Look for lower-sodium products, often labeled as "low-sodium" or "no salt added."  Always check the sodium content even if foods are labeled as "unsalted" or "no salt added".  Buy fresh foods. ? Avoid canned foods and premade or frozen meals. ? Avoid canned, cured, or processed meats  Buy breads that have less than 80 mg of sodium per slice. Cooking  Eat more home-cooked food and less restaurant, buffet, and fast food.  Avoid adding salt when cooking. Use salt-free seasonings or herbs instead of table salt or sea salt. Check with your health care provider or pharmacist before using salt substitutes.  Cook with plant-based oils, such as canola, sunflower, or olive oil. Meal planning  When eating at a restaurant, ask that your food be prepared with less salt or no salt, if possible.  Avoid foods that contain MSG (monosodium glutamate). MSG is sometimes added to [place] food, bouillon, and some canned foods. What foods are recommended? The items listed may not be a complete list. Talk with your dietitian about what dietary choices are best for you. Grains Low-sodium cereals, including oats, puffed wheat and rice, and shredded wheat. Low-sodium crackers. Unsalted rice. Unsalted pasta. Low-sodium bread. Whole-grain breads and whole-grain pasta. Vegetables Fresh or frozen vegetables. "No salt added" canned vegetables. "No salt added" tomato sauce and paste. Low-sodium or reduced-sodium tomato and vegetable juice. Fruits Fresh, frozen, or canned fruit. Fruit juice. Meats and other protein foods Fresh or frozen (no salt added) meat, poultry, seafood,  and fish. Low-sodium canned tuna and salmon. Unsalted nuts. Dried peas, beans, and lentils without added salt. Unsalted canned beans. Eggs. Unsalted nut butters. Dairy Milk. Soy milk. Cheese that is naturally low in sodium, such as ricotta cheese, fresh mozzarella, or Swiss cheese Low-sodium or reduced-sodium cheese. Cream  cheese. Yogurt. Fats and oils Unsalted butter. Unsalted margarine with no trans fat. Vegetable oils such as canola or olive oils. Seasonings and other foods Fresh and dried herbs and spices. Salt-free seasonings. Low-sodium mustard and ketchup. Sodium-free salad dressing. Sodium-free light mayonnaise. Fresh or refrigerated horseradish. Lemon juice. Vinegar. Homemade, reduced-sodium, or low-sodium soups. Unsalted popcorn and pretzels. Low-salt or salt-free chips. What foods are not recommended? The items listed may not be a complete list. Talk with your dietitian about what dietary choices are best for you. Grains Instant hot cereals. Bread stuffing, pancake, and biscuit mixes. Croutons. Seasoned rice or pasta mixes. Noodle soup cups. Boxed or frozen macaroni and cheese. Regular salted crackers. Self-rising flour. Vegetables Sauerkraut, pickled vegetables, and relishes. Olives. [place] fries. Onion rings. Regular canned vegetables (not low-sodium or reduced-sodium). Regular canned tomato sauce and paste (not low-sodium or reduced-sodium). Regular tomato and vegetable juice (not low-sodium or reduced-sodium). Frozen vegetables in sauces. Meats and other protein foods Meat or fish that is salted, canned, smoked, spiced, or pickled. Bacon, ham, sausage, hotdogs, corned beef, chipped beef, packaged lunch meats, salt pork, jerky, pickled herring, anchovies, regular canned tuna, sardines, salted nuts. Dairy Processed cheese and cheese spreads. Cheese curds. Blue cheese. Feta cheese. String cheese. Regular cottage cheese. Buttermilk. Canned milk. Fats and oils Salted butter. Regular margarine. Ghee. Bacon fat. Seasonings and other foods Onion salt, garlic salt, seasoned salt, table salt, and sea salt. Canned and packaged gravies. Worcestershire sauce. Tartar sauce. Barbecue sauce. Teriyaki sauce. Soy sauce, including reduced-sodium. Steak sauce. Fish sauce. Oyster sauce. Cocktail sauce. Horseradish that you  find on the shelf. Regular ketchup and mustard. Meat flavorings and tenderizers. Bouillon cubes. Hot sauce and Tabasco sauce. Premade or packaged marinades. Premade or packaged taco seasonings. Relishes. Regular salad dressings. Salsa. Potato and tortilla chips. Corn chips and puffs. Salted popcorn and pretzels. Canned or dried soups. Pizza. Frozen entrees and pot pies. Summary  Eating less sodium can help lower your blood pressure, reduce swelling, and protect your heart, liver, and kidneys.  Most people on this plan should limit their sodium intake to 1,500-2,000 mg (milligrams) of sodium each day.  Canned, boxed, and frozen foods are high in sodium. Restaurant foods, fast foods, and pizza are also very high in sodium. You also get sodium by adding salt to food.  Try to cook at home, eat more fresh fruits and vegetables, and eat less fast food, canned, processed, or prepared foods. This information is not intended to replace advice given to you by your health care provider. Make sure you discuss any questions you have with your health care provider. Document Released: 09/19/2001 Document Revised: 03/23/2016 Document Reviewed: 03/23/2016 Elsevier Interactive Patient Education  2019 [employer].

## 2018-05-18 NOTE — Progress Notes (Signed)
Chief Complaint  Patient presents with  . Hypertension    had been having some elevated bp readings. Over the last week they have seemed to come down a bit but still concerned.     He has noted higher BP's over the last month or so 140-150's/70-80, but also reports sometimes seeing BP's that are good, such as 125/62.  Today it was 133/75. He retired, had to change to [employer], caused a lot of stress. Wife keeps asking him a lot of questions about why they're on different plans, gets a little irritated by her asking the same questions.  Finds the stress of changing to medicare has improved some.  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  Right shoulder periodically gets sore (known arthritis and RC tear, last saw Dr. Marlou Sa in 02/2018). Denies pain currently.  He takes 2 Aleve about every 5 days, which helps with his pain (hands, shoulder).  Only takes aspirin occasionally, as he isn't sure which is bothering his esophagus,sometimes has trouble swallowing.  He sees Dr. Earlean Shawl regularly for this  PMH, [place], [place] reviewed  Outpatient Encounter Medications as of 05/18/2018  Medication Sig Note  . aspirin EC 81 MG tablet Take 81 mg by mouth daily. 05/18/2018: Takes it sporadically  . Cholecalciferol (VITAMIN D-3) 1000 units CAPS Take 2 capsules by mouth daily. 07/21/2017: Takes one a day, unsure of dose  . fish oil-omega-3 fatty acids 1000 MG capsule Take 1 g by mouth daily.    Marland Kitchen losartan (COZAAR) 50 MG tablet TAKE 1 TABLET BY MOUTH EVERY DAY   . naproxen sodium (ALEVE) 220 MG tablet Take 220 mg by mouth. 07/21/2017: Takes 2 aleve about once a week (for pain in hands/wrists)  . Pyridoxine HCl (VITAMIN B-6) 500 MG tablet Take 500 mg by mouth daily.     . vitamin B-12 (CYANOCOBALAMIN) 1000 MCG tablet Take 1,000 mcg by mouth daily.   . vitamin C (ASCORBIC ACID) 500 MG tablet Take 500 mg by mouth daily.   Marland Kitchen albuterol (PROVENTIL HFA;VENTOLIN  HFA) 108 (90 BASE) MCG/ACT inhaler Inhale 2 puffs into the lungs every 6 (six) hours as needed for wheezing. Use 30 mins prior to exercise (Patient not taking: Reported on 07/09/2016) 07/21/2017: Uses rarely, last needed 03/2017 (Christmas as his son's)   No facility-administered encounter medications on file as of 05/18/2018.    No Known Allergies  ROS: no fever, chills, headaches, dizziness, chest pain, shortness of breath, URI symptoms, edema, bleeding, bruising, rash or other complaints.  Intermittent trouble swallowing.  +Snores, but no unrefreshed sleep or daytime somnlence   PHYSICAL EXAM:  BP 140/80   Pulse (!) 56   Ht 5\' 9"  (1.753 m)   Wt 168 lb 9.6 oz (76.5 kg)   BMI 24.90 kg/m   144/74 on repeat by MD  BP Readings from Last 3 Encounters:  05/18/18 140/80  10/20/17 130/80  07/21/17 (!) 150/90   Well appearing, pleasant male, in good spirits, in no distress HEENT: PERRL, EOMI, conjunctiva and sclera are clear. Neck: no lymphadenopathy, thyromegaly or mass Heart: regular rhythm, bradycardic. No murmurs Lungs: clear bilaterally Extremities: no edema.  Right shoulder seems to sit a little lower than his left. Psych: normal mood, affect, hygiene and grooming Neuro: alert and oriented, cranial nerves intact   ASSESSMENT/PLAN:  Essential hypertension - fluctuations in BP's, some high, some normal. Reviewed factors contributing to BP. cont losartan 50mg  and regular  monitoring.  Send in list in 4-6 wks   Given new BP log.  Can send in via MyChart message in 4-6 weeks vs bring to his next scheduled visit in April to review if not running consistently high. If consistently high, can increase losartan dose.  Counseled re: low sodium diet, regular exercise, stress reduction, when to check BP's (and to repeat if high), and to write comments on the log.    He will be needing a refill by the end of the month (recently got 25mg  tablets, as 50mg  wasn't available).  He isn't sure  which pharmacy he will be using, thinks he needs to change due to changing insurance. He will contact us with which pharmacy when refill is needed.  F/u in April as scheduled, sooner prn.

## 2018-05-31 DIAGNOSIS — C44329 Squamous cell carcinoma of skin of other parts of face: Secondary | ICD-10-CM | POA: Diagnosis not present

## 2018-06-08 ENCOUNTER — Telehealth: Payer: Self-pay | Admitting: Family Medicine

## 2018-06-08 DIAGNOSIS — I10 Essential (primary) hypertension: Secondary | ICD-10-CM

## 2018-06-08 MED ORDER — LOSARTAN POTASSIUM 50 MG PO TABS: 50.0000 mg | ORAL_TABLET | Freq: Every day | ORAL | 0 refills | Status: DC

## 2018-06-08 NOTE — Telephone Encounter (Signed)
Pt left message and stated he needs refill on Losartan 50mg . Pt wants to transfer to the [facility] on [address].

## 2018-06-08 NOTE — Telephone Encounter (Signed)
Done

## 2018-06-16 ENCOUNTER — Other Ambulatory Visit: Payer: Self-pay | Admitting: Family Medicine

## 2018-06-30 DIAGNOSIS — D04112 Carcinoma in situ of skin of right lower eyelid, including canthus: Secondary | ICD-10-CM | POA: Diagnosis not present

## 2018-07-05 ENCOUNTER — Telehealth (INDEPENDENT_AMBULATORY_CARE_PROVIDER_SITE_OTHER): Payer: Self-pay | Admitting: *Deleted

## 2018-07-05 NOTE — Telephone Encounter (Signed)
Called pt and asked COVID-19 Pre-Screening Questions and they answered no to all questions listed. 

## 2018-07-06 ENCOUNTER — Other Ambulatory Visit: Payer: Self-pay

## 2018-07-06 ENCOUNTER — Ambulatory Visit (INDEPENDENT_AMBULATORY_CARE_PROVIDER_SITE_OTHER): Payer: Medicare Other | Admitting: Orthopedic Surgery

## 2018-07-06 ENCOUNTER — Encounter (INDEPENDENT_AMBULATORY_CARE_PROVIDER_SITE_OTHER): Payer: Self-pay | Admitting: Orthopedic Surgery

## 2018-07-06 DIAGNOSIS — M12811 Other specific arthropathies, not elsewhere classified, right shoulder: Secondary | ICD-10-CM | POA: Diagnosis not present

## 2018-07-06 DIAGNOSIS — M75101 Unspecified rotator cuff tear or rupture of right shoulder, not specified as traumatic: Secondary | ICD-10-CM

## 2018-07-06 MED ORDER — BUPIVACAINE HCL 0.5 % IJ SOLN
9.0000 mL | INTRAMUSCULAR | Status: AC | PRN
  Administered 2018-07-06: 9 mL via INTRA_ARTICULAR

## 2018-07-06 MED ORDER — LIDOCAINE HCL 1 % IJ SOLN
5.0000 mL | INTRAMUSCULAR | Status: AC | PRN
  Administered 2018-07-06: 5 mL

## 2018-07-06 MED ORDER — METHYLPREDNISOLONE ACETATE 40 MG/ML IJ SUSP
40.0000 mg | INTRAMUSCULAR | Status: AC | PRN
  Administered 2018-07-06: 40 mg via INTRA_ARTICULAR

## 2018-07-06 NOTE — Progress Notes (Signed)
Office Visit Note   Patient: Wayne Brandt           Date of Birth: July 09, 1950           MRN: [id number] Visit Date: 07/06/2018 Requested by: Rita Ohara, [address] [place], [place] [id number] PCP: Rita Ohara, MD  Subjective: Chief Complaint  Patient presents with  . Right Shoulder - Pain    HPI: Patient presents for evaluation of right shoulder.  Has a known history of right rotator cuff arthropathy.  Pain is gotten worse since last office visit.  He has been playing tennis.  He is right-hand dominant.  The pain does not wake him from sleep at night.              ROS: All systems reviewed are negative as they relate to the chief complaint within the history of present illness.  Patient denies  fevers or chills.   Assessment & Plan: Visit Diagnoses:  1. Rotator cuff tear arthropathy of right shoulder     Plan: Impression is rotator cuff arthropathy right shoulder with some increased pain with tennis activity.  He deftly wants to continue playing tennis.  I am going to inject in the subacromial space and see how he does with that.  He does have very good function but weakness and external rotation.  I will see him back as needed.  Follow-Up Instructions: Return if symptoms worsen or fail to improve.   Orders:  No orders of the defined types were placed in this encounter.  No orders of the defined types were placed in this encounter.     Procedures: Large Joint Inj: R subacromial bursa on 07/06/2018 2:44 PM Indications: diagnostic evaluation and pain Details: 18 G 1.5 in needle, posterior approach  Arthrogram: No  Medications: 9 mL bupivacaine 0.5 %; 40 mg methylPREDNISolone acetate 40 MG/ML; 5 mL lidocaine 1 % Outcome: tolerated well, no immediate complications Procedure, treatment alternatives, risks and benefits explained, specific risks discussed. Consent was given by the patient. Immediately prior to procedure a time out was called to verify the correct  patient, procedure, equipment, support staff and site/side marked as required. Patient was prepped and draped in the usual sterile fashion.       Clinical Data: No additional findings.  Objective: Vital Signs: There were no vitals taken for this visit.  Physical Exam:   Constitutional: Patient appears well-developed HEENT:  Head: Normocephalic Eyes:EOM are normal Neck: Normal range of motion Cardiovascular: Normal rate Pulmonary/chest: Effort normal Neurologic: Patient is alert Skin: Skin is warm Psychiatric: Patient has normal mood and affect    Ortho Exam: Ortho exam demonstrates flexion and abduction both above 90 degrees in the right shoulder but with rotator cuff weakness to infraspinatus testing.  Subscap strength testing intact.  There is some coarse grinding and crepitus with passive range of motion.  Specialty Comments:  No specialty comments available.  Imaging: No results found.   PMFS History: Patient Active Problem List   Diagnosis Date Noted  . Senile purpura ([place]) 07/22/2017  . BPH with urinary obstruction 06/08/2016  . Family history of heart disease in male family member before age 31 04/09/2016  . Aortic atherosclerosis ([place]) 04/09/2016  . Essential hypertension 04/09/2016  . Vitamin D deficiency 06/29/2014  . Exercise-induced asthma 06/28/2014  . Eosinophilic esophagitis 94/50/3888  . Peyronie disease 06/26/2013  . Allergic rhinitis 06/26/2013  . Microscopic hematuria 12/03/2011   Past Medical History:  Diagnosis Date  . Actinic keratosis  Dr. Renda Rolls  . Arthritis    wrists, L thumb  . BPH (benign prostatic hyperplasia)   . ED (erectile dysfunction)   . Family history of premature CAD   . HH (hiatus hernia)   . History of basal cell carcinoma excision    2013-- chest area  . History of esophagogastroduodenoscopy (EGD) 12/06/2017   Dr.Medoff-distal esophageal stricture,hiatal hernia,gastritis and duodenitis.  . Hypertension   .  Lower urinary tract symptoms (LUTS)   . Mild intermittent asthma   . Peyronie's disease   . Polio    dx 1956  effected left leg  . S/P dilatation of esophageal stricture    multiple times  . Urinary retention     Family History  Problem Relation Age of Onset  . Alcohol abuse Mother   . Cancer Mother        male cancer in 14's  . Heart disease Father 31       MI  . Heart disease Brother 71       MI  . Glaucoma Brother   . Heart disease Brother 48       stents  . Diabetes Neg Hx   . Colon cancer Neg Hx     Past Surgical History:  Procedure Laterality Date  . ANKLE FUSION Left 1997  . BAND HEMORRHOIDECTOMY     Dr. Earlean Shawl  . CARDIOVASCULAR STRESS TEST  2006   normal--Dr. Percival Spanish  . COLONOSCOPY, ESOPHAGOGASTRODUODENOSCOPY (EGD) AND ESOPHAGEAL DILATION  02/15/2014   Dr.Medoff  . ESOPHAGEAL DILATION  02/08/2017   Dr. Earlean Shawl.  EGD showed stricture and HH  . ESOPHAGOGASTRODUODENOSCOPY  02/04/2017  . ESOPHAGOGASTRODUODENOSCOPY ENDOSCOPY  02/08/2017  . I & D RIGHT KNEE W/ REMOVAL PATELLA WIRES  02/09/2007  . KNEE ARTHROSCOPY Right 07/07/2000   post-op  07-09-2000  right knee arthroscopy w/ lavage and debridement hemathrosis/ pyathrosis  . LEFT FOOT SURGERY  x2  as child   polio  . NEUROPLASTY / TRANSPOSITION ULNAR NERVE AT ELBOW Right 01/04/2008  . ORIF RIGHT PERIPROSTHEITC PATELLA FX  11/01/2006  . TOTAL KNEE ARTHROPLASTY Right 02/22/2006  . TRANSURETHRAL RESECTION OF PROSTATE N/A 06/08/2016   Procedure: TRANSURETHRAL RESECTION OF THE PROSTATE (TURP);  Surgeon: Kathie Rhodes, MD;  Location: [facility];  Service: Urology;  Laterality: N/A;  . WRIST ARTHROSCOPY WITH DEBRIDEMENT Right 03/30/2001   Social History   Occupational History  . 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 Smoking status: Never Smoker  . Smokeless tobacco: Never Used  Substance and Sexual Activity  . Alcohol use: Yes    Comment: 1-2  beers/day  . Drug use: No  . Sexual activity: Not Currently    Partners: Female

## 2018-07-21 ENCOUNTER — Other Ambulatory Visit: Payer: Self-pay | Admitting: *Deleted

## 2018-07-21 DIAGNOSIS — Z125 Encounter for screening for malignant neoplasm of prostate: Secondary | ICD-10-CM

## 2018-07-21 DIAGNOSIS — I700 Atherosclerosis of aorta: Secondary | ICD-10-CM

## 2018-07-21 DIAGNOSIS — I10 Essential (primary) hypertension: Secondary | ICD-10-CM

## 2018-07-21 DIAGNOSIS — Z0000 Encounter for general adult medical examination without abnormal findings: Secondary | ICD-10-CM

## 2018-07-26 DIAGNOSIS — S46211A Strain of muscle, fascia and tendon of other parts of biceps, right arm, initial encounter: Secondary | ICD-10-CM | POA: Diagnosis not present

## 2018-07-26 DIAGNOSIS — M25521 Pain in right elbow: Secondary | ICD-10-CM | POA: Diagnosis not present

## 2018-07-27 NOTE — Progress Notes (Signed)
Start time: 8:49 End time: 9:30  Virtual Visit via Video Note  I connected with Wayne Brandt on 07/28/2018 by a video enabled telemedicine application (Zoom) and verified that I am speaking with the correct person using two identifiers. Patient is at home (wife is elsewhere in the house). MD is in office  I discussed the limitations of evaluation and management by telemedicine and the availability of in person appointments. The patient expressed understanding and agreed to proceed.  History of Present Illness:  Chief Complaint  Patient presents with  . Medicare Wellness    VIRTUAL welcome to medicare. No concerns. Has eye exam every year and is UTD-Dr Dyke Maes @ [facility].    Patient presents for IPPE exam.  Due to COVID-19 pandemic and stay-at-home order, his visit was changed to a virtual (video) visit. He has scheduled another appointment for August when physical exam portion will be done, as well as fasting labs and med check (has been postponed, to keep him home/safe).  After a backhand volley Tuesday of last week, he developed acute onset of pain in his R arm. There was bruising about the bicep.He saw Dr. Burney Gauze 2 days ago and was diagnosed with Popeye injury.  Bruising has almost resolved.  Denies significant pain. Still has deformity.  Immunization History  Administered Date(s) Administered  . Influenza Whole 01/05/2008  . Influenza, High Dose Seasonal PF 02/06/2016, 06/21/2017, 02/16/2018  . Influenza,inj,Quad PF,6+ Mos 02/01/2013, 05/28/2015  . Pneumococcal Conjugate-13 07/04/2015  . Pneumococcal Polysaccharide-23 06/01/2007, 07/09/2016  . Td 06/20/1997, 07/21/2017  . Tdap 01/05/2008  . Zoster 02/25/2012   Last colonoscopy: 02/2014; pt states told 10 years Last PSA: 07/2017, 0.3 Dentist: twice yearly  Ophtho: yearly; has a cataract in the R eye that may need surgery Exercise: daily (elliptical, bike); hasn't been able to go to the gym during  stay-at-home order related to coronavirus pandemic.  He remains active also playing tennis, golf. Exercises daily.   "I ain't staying home".  Fall screen: negative Depression screen: negative Functional status survey: unremarkable. Exercises daily See full screens in epic.  Other doctors caring for patient include: ENT: Dr. Redmond Baseman Urologist: Dr. Karsten Ro (sees prn) Dentist: Dr. Deatra Ina Dermatologist: Dr. Renda Rolls Cardiologist: Dr. Percival Spanish GI: Dr. Earlean Shawl Ortho: Dr. Wynelle Link, Dr. Marlou Sa (for shoulder), Dr. Burney Gauze  He doesn't have Living Will or Healthcare power of attorney. He has been given paperwork in the past. He plans to do it.  Past Medical History:  Diagnosis Date  . Actinic keratosis    Dr. Renda Rolls  . Arthritis    wrists, L thumb  . BPH (benign prostatic hyperplasia)   . ED (erectile dysfunction)   . Family history of premature CAD   . HH (hiatus hernia)   . History of basal cell carcinoma excision    2013-- chest area  . History of esophagogastroduodenoscopy (EGD) 12/06/2017   Dr.Medoff-distal esophageal stricture,hiatal hernia,gastritis and duodenitis.  . Hypertension   . Lower urinary tract symptoms (LUTS)   . Mild intermittent asthma   . Peyronie's disease   . Polio    dx 1956  effected left leg  . S/P dilatation of esophageal stricture    multiple times  . SCC (squamous cell carcinoma) 2020   face  . Urinary retention     Past Surgical History:  Procedure Laterality Date  . ANKLE FUSION Left 1997  . BAND HEMORRHOIDECTOMY     Dr. Earlean Shawl  . CARDIOVASCULAR STRESS TEST  2006  normal--Dr. Percival Spanish  . COLONOSCOPY, ESOPHAGOGASTRODUODENOSCOPY (EGD) AND ESOPHAGEAL DILATION  02/15/2014   Dr.Medoff  . ESOPHAGEAL DILATION  02/08/2017   Dr. Earlean Shawl.  EGD showed stricture and HH  . ESOPHAGOGASTRODUODENOSCOPY  02/04/2017  . ESOPHAGOGASTRODUODENOSCOPY ENDOSCOPY  02/08/2017  . I & D RIGHT KNEE W/ REMOVAL PATELLA WIRES  02/09/2007  . KNEE ARTHROSCOPY Right  07/07/2000   post-op  07-09-2000  right knee arthroscopy w/ lavage and debridement hemathrosis/ pyathrosis  . LEFT FOOT SURGERY  x2  as child   polio  . NEUROPLASTY / TRANSPOSITION ULNAR NERVE AT ELBOW Right 01/04/2008  . ORIF RIGHT PERIPROSTHEITC PATELLA FX  11/01/2006  . TOTAL KNEE ARTHROPLASTY Right 02/22/2006  . TRANSURETHRAL RESECTION OF PROSTATE N/A 06/08/2016   Procedure: TRANSURETHRAL RESECTION OF THE PROSTATE (TURP);  Surgeon: Kathie Rhodes, MD;  Location[facility];  Service: Urology;  Laterality: N/A;  . WRIST ARTHROSCOPY WITH DEBRIDEMENT Right 03/30/2001    Social History   Socioeconomic History  . Marital status: Married    Spouse name: Not on file  . Number of children: 1  . Years of education: Not on file  . Highest education level: Not on file  Occupational History  . 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 Financial resource strain: Not on file  . Food insecurity:    Worry: Not on file    Inability: Not on file  . Transportation needs:    Medical: Not on file    Non-medical: Not on file  Tobacco Use  . Smoking status: Never Smoker  . Smokeless tobacco: Never Used  Substance and Sexual Activity  . Alcohol use: Yes    Comment: 1-2 beers/day  . Drug use: No  . Sexual activity: Not Currently    Partners: Female  Lifestyle  . Physical activity:    Days per week: Not on file    Minutes per session: Not on file  . Stress: Not on file  Relationships  . Social connections:    Talks on phone: Not on file    Gets together: Not on file    Attends religious service: Not on file    Active member of club or organization: Not on file    Attends meetings of clubs or organizations: Not on file    Relationship status: Not on file  . Intimate partner violence:    Fear of current or ex partner: Not on file    Emotionally abused: Not on file    Physically abused: Not on file    Forced sexual activity:  Not on file  Other Topics Concern  . Not on file  Social History Narrative   Lives with wife. Son from a previous marriage lives in [place].  1 granddaughter.    Retired 04/15/2018    Family History  Problem Relation Age of Onset  . Alcohol abuse Mother   . Cancer Mother        male cancer in 71's  . Heart disease Father 43       MI  . Heart disease Brother 46       MI  . Glaucoma Brother   . Heart disease Brother 48       stents  . Diabetes Neg Hx   . Colon cancer Neg Hx     Outpatient Encounter Medications as of 07/28/2018  Medication Sig Note  . Cholecalciferol (VITAMIN D-3) 1000 units CAPS Take 2 capsules by mouth  daily.   . fish oil-omega-3 fatty acids 1000 MG capsule Take 1 g by mouth daily.    Marland Kitchen losartan (COZAAR) 50 MG tablet Take 1 tablet (50 mg total) by mouth daily.   . Pyridoxine HCl (VITAMIN B-6) 500 MG tablet Take 500 mg by mouth daily.     . vitamin B-12 (CYANOCOBALAMIN) 1000 MCG tablet Take 1,000 mcg by mouth daily.   . vitamin C (ASCORBIC ACID) 500 MG tablet Take 500 mg by mouth daily.   . [DISCONTINUED] losartan (COZAAR) 25 MG tablet TAKE 2 TABLETS BY MOUTH EVERY DAY   . albuterol (PROVENTIL HFA;VENTOLIN HFA) 108 (90 BASE) MCG/ACT inhaler Inhale 2 puffs into the lungs every 6 (six) hours as needed for wheezing. Use 30 mins prior to exercise (Patient not taking: Reported on 07/09/2016) 07/28/2018: Only needs to use it around Christmas (going to his son's house or his mother's (related to candles/scents/pets).  Marland Kitchen aspirin EC 81 MG tablet Take 81 mg by mouth daily. 05/18/2018: Takes it sporadically  . naproxen sodium (ALEVE) 220 MG tablet Take 220 mg by mouth. 07/28/2018: Uses prn (shoulder pain, hand/wrist)   No facility-administered encounter medications on file as of 07/28/2018.     No Known Allergies   ROS: The patient denies anorexia, fever, headaches, vision loss, hoarseness, chest pain, palpitations, dizziness, syncope, dyspnea on exertion, swelling, nausea,  vomiting, diarrhea, constipation, abdominal pain, melena, hematochezia, indigestion/heartburn, hematuria, dysuria, weakness, tremor, suspicious skin lesions (sees derm, Dr.Haverstockregularly), depression, anxiety, abnormal bleeding/bruising, or enlarged lymph nodes  H/o microscopic hematuria--no visible blood per pt  No rectal bleeding since getting hemorrhoidal banding  Occasional tingling R hand from ulnar nerve issue (chronic)--has this only sometimes. Thinks the B6 he takes helps, not bothering him recently.He denies any weakness. Occasional L thumb pain, worse with golfing(known arthritis), occasional bilateral wrist pain (can no longer do pushups) Right shoulder pain (sees Dr. Marlou Sa, who suggested shoulder replacement, pt declines). Pain with tennis. Up 3x/night to void. Denies hesitancy, weakened stream. No further problems with dysphagia (not since last dilatation) No further problems with hearing loss, resolved. Sees dermatology at least once a year, he reports he had 2 SCC removed in the last few months (under eyes bilaterally, on face)  Observations/Objective:  BP 126/66   Pulse (!) 55   Ht 5\' 9"  (1.753 m)   Wt 164 lb (74.4 kg)   BMI 24.22 kg/m  Wt Readings from Last 3 Encounters:  07/28/18 164 lb (74.4 kg)  05/18/18 168 lb 9.6 oz (76.5 kg)  03/09/18 164 lb (74.4 kg)   Exam is limited due to virtual nature of the visit. He appears alert, oriented, in good spirits. Normal eye contact, speech, grooming. Prominence of right bicep muscle noted with contraction ("popeye injury"), with some ecchymosis noted.  Assessment and Plan:  Welcome to Medicare preventive visit  Counseled re: Stay at home order, social distancing, how to safely play golf, limit exposures.  Living will and healthcare POA--reminded to complete and get [place] copies.  Recommended at least 30 minutes of aerobic activity at least 5 days/week, weight-bearing exercise at least 2x/wk; proper sunscreen use  reviewed; healthy diet and alcohol recommendations (less than or equal to 2 drinks/day) reviewed; regular seatbelt use; changing batteries in smoke detectors. Immunization recommendations discussed--continue yearly high dose flu shots. Shingrix recommended, to get from pharmacy. Risks/SE reviewed. Colonoscopy recommendations reviewed--UTD.  Full Code, Full care Discussed MOST form (unable to complete due to virtual nature of visit), wants full measures.  F/u in  August as scheduled for fasting labs, med check, physical.   Follow Up Instructions:  Advised on how to view his AVS through MyChart, and if he has trouble, we can print and mail home. Discussed all info on AVS.  I discussed the assessment and treatment plan with the patient. The patient was provided an opportunity to ask questions and all were answered. The patient agreed with the plan and demonstrated an understanding of the instructions.   The patient was advised to call back or seek an in-person evaluation if the symptoms worsen or if the condition fails to improve as anticipated.  I provided 41 minutes of non-face-to-face time during this encounter.   Vikki Ports, MD  Medicare Attestation I have personally reviewed: The patient's medical and social history Their use of alcohol, tobacco or illicit drugs Their current medications and supplements The patient's functional ability including ADLs,fall risks, home safety risks, cognitive, and hearing and visual impairment Diet and physical activities Evidence for depression or mood disorders  The patient's weight, height and BMI have been recorded in the chart.  Due to virtual nature of visit (due to COVID-19 pandemic and stay-at-home orders), snellen was not performed; patient sees eye doctor yearly.  I have made referrals, counseling, and provided education to the patient based on review of the above and I have provided the patient with a written personalized care plan for  preventive services.

## 2018-07-28 ENCOUNTER — Ambulatory Visit (INDEPENDENT_AMBULATORY_CARE_PROVIDER_SITE_OTHER): Payer: Medicare Other | Admitting: Family Medicine

## 2018-07-28 ENCOUNTER — Other Ambulatory Visit: Payer: Self-pay

## 2018-07-28 ENCOUNTER — Encounter: Payer: Self-pay | Admitting: Family Medicine

## 2018-07-28 VITALS — BP 126/66 | HR 55 | Ht 69.0 in | Wt 164.0 lb

## 2018-07-28 DIAGNOSIS — Z0000 Encounter for general adult medical examination without abnormal findings: Secondary | ICD-10-CM

## 2018-07-28 NOTE — Patient Instructions (Addendum)
Wayne Brandt , Thank you for taking time to do your Welcome to Medicare visit. I appreciate your ongoing commitment to your health goals. Please review the following plan we discussed and let me know if I can assist you in the future.   This is a list of the screening recommended for you and due dates:  Health Maintenance  Topic Date Due  . Flu Shot  11/12/2018  . Colon Cancer Screening  02/16/2024  . Tetanus Vaccine  07/22/2027  .  Hepatitis C: One time screening is recommended by Center for Disease Control  (CDC) for  adults born from [birth year] through 1966.   Completed  . Pneumonia vaccines  Completed   Continue yearly flu shots (high dose) every Fall (not August date as stated above).  I recommend getting the new shingles vaccine (Shingrix). This should be covered by Medicare Part D (they can tell you the out of pocket cost/copay),so you need to get the vaccine from the pharmacy rather than our office.  It is a series of 2 injections, spaced 2 months apart.  Please start taking your 81mg  of enteric coated aspirin every day.  Please complete your living will and healthcare power of attorney as we discussed.  Once it is notarized, please get [place] a copy at your convenience, so we can scan it into your chart.  Coronavirus (COVID-19) Are you at risk?  Are you at risk for the Coronavirus (COVID-19)?  To be considered HIGH RISK for Coronavirus (COVID-19), you have to meet the following criteria:  . Traveled to [place], [place], [place], [place] or [place]; or in the [place] to [place], [place], [place], or [place]; and have fever, cough, and shortness of breath within the last 2 weeks of travel OR . Been in close contact with a person diagnosed with COVID-19 within the last 2 weeks and have fever, cough, and shortness of breath . IF YOU DO NOT MEET THESE CRITERIA, YOU ARE CONSIDERED LOW RISK FOR COVID-19.  What to do if you are HIGH RISK for COVID-19?  Marland Kitchen If you are having a  medical emergency, call 911. . Seek medical care right away. Before you go to a doctor's office, urgent care or emergency department, call ahead and tell them about your recent travel, contact with someone diagnosed with COVID-19, and your symptoms. You should receive instructions from your physician's office regarding next steps of care.  . When you arrive at healthcare provider, tell the healthcare staff immediately you have returned from visiting [place], [place], [place], [place] or [place]; or traveled in the [place] to [place], [place], [place], or [place]; in the last two weeks or you have been in close contact with a person diagnosed with COVID-19 in the last 2 weeks.   . Tell the health care staff about your symptoms: fever, cough and shortness of breath. . After you have been seen by a medical provider, you will be either: o Tested for (COVID-19) and discharged home on quarantine except to seek medical care if symptoms worsen, and asked to  - Stay home and avoid contact with others until you get your results (4-5 days)  - Avoid travel on public transportation if possible (such as bus, train, or airplane) or o Sent to the Emergency Department by EMS for evaluation, COVID-19 testing, and possible admission depending on your condition and test results.  What to do if you are LOW RISK for COVID-19?  Reduce your risk of  any infection by using the same precautions used for avoiding the common cold or flu:  Marland Kitchen Wash your hands often with soap and warm water for at least 20 seconds.  If soap and water are not readily available, use an alcohol-based hand sanitizer with at least 60% alcohol.  . If coughing or sneezing, cover your mouth and nose by coughing or sneezing into the elbow areas of your shirt or coat, into a tissue or into your sleeve (not your hands). . Avoid shaking hands with others and consider head nods or verbal greetings only. . Avoid touching your eyes, nose, or mouth with  unwashed hands.  . Avoid close contact with people who are sick. . Avoid places or events with large numbers of people in one location, like concerts or sporting events. . Carefully consider travel plans you have or are making. . If you are planning any travel outside or inside the [place], visit the CDC's Travelers' Health webpage for the latest health notices. . If you have some symptoms but not all symptoms, continue to monitor at home and seek medical attention if your symptoms worsen. . If you are having a medical emergency, call 911.   [facility] / e-Visit: [email or web address]         [facility]: [facility]: [phone]                   [facility]: [phone]

## 2018-09-05 ENCOUNTER — Other Ambulatory Visit: Payer: Self-pay | Admitting: Family Medicine

## 2018-09-05 DIAGNOSIS — I10 Essential (primary) hypertension: Secondary | ICD-10-CM

## 2018-09-06 DIAGNOSIS — S01402D Unspecified open wound of left cheek and temporomandibular area, subsequent encounter: Secondary | ICD-10-CM | POA: Diagnosis not present

## 2018-09-08 DIAGNOSIS — Z0120 Encounter for dental examination and cleaning without abnormal findings: Secondary | ICD-10-CM | POA: Diagnosis not present

## 2018-10-27 DIAGNOSIS — L570 Actinic keratosis: Secondary | ICD-10-CM | POA: Diagnosis not present

## 2018-10-27 DIAGNOSIS — L814 Other melanin hyperpigmentation: Secondary | ICD-10-CM | POA: Diagnosis not present

## 2018-10-27 DIAGNOSIS — L821 Other seborrheic keratosis: Secondary | ICD-10-CM | POA: Diagnosis not present

## 2018-10-27 DIAGNOSIS — Z85828 Personal history of other malignant neoplasm of skin: Secondary | ICD-10-CM | POA: Diagnosis not present

## 2018-11-04 ENCOUNTER — Telehealth: Payer: Self-pay | Admitting: Family Medicine

## 2018-11-04 NOTE — Telephone Encounter (Signed)
Pt call back and said it is not needed.

## 2018-11-04 NOTE — Telephone Encounter (Signed)
Pt called and wanted to know if he can get a note for the gym stating that he is able to work out. Pt said the gyms are opening back up Monday but you have to have a drs note to workout. Pt can be reached at [phone]

## 2018-11-09 DIAGNOSIS — Z1159 Encounter for screening for other viral diseases: Secondary | ICD-10-CM | POA: Diagnosis not present

## 2018-11-14 ENCOUNTER — Other Ambulatory Visit: Payer: Self-pay

## 2018-11-14 ENCOUNTER — Other Ambulatory Visit: Payer: Medicare Other

## 2018-11-14 DIAGNOSIS — I10 Essential (primary) hypertension: Secondary | ICD-10-CM | POA: Diagnosis not present

## 2018-11-14 DIAGNOSIS — I700 Atherosclerosis of aorta: Secondary | ICD-10-CM

## 2018-11-14 DIAGNOSIS — Z125 Encounter for screening for malignant neoplasm of prostate: Secondary | ICD-10-CM | POA: Diagnosis not present

## 2018-11-14 DIAGNOSIS — Z0000 Encounter for general adult medical examination without abnormal findings: Secondary | ICD-10-CM

## 2018-11-15 ENCOUNTER — Other Ambulatory Visit: Payer: Self-pay

## 2018-11-15 LAB — COMPREHENSIVE METABOLIC PANEL
ALT: 19 IU/L (ref 0–44)
AST: 34 IU/L (ref 0–40)
Albumin/Globulin Ratio: 1.7 (ref 1.2–2.2)
Albumin: 4.3 g/dL (ref 3.8–4.8)
Alkaline Phosphatase: 96 IU/L (ref 39–117)
BUN/Creatinine Ratio: 21 (ref 10–24)
BUN: 17 mg/dL (ref 8–27)
Bilirubin Total: 0.3 mg/dL (ref 0.0–1.2)
CO2: 24 mmol/L (ref 20–29)
Calcium: 9.2 mg/dL (ref 8.6–10.2)
Chloride: 92 mmol/L — ABNORMAL LOW (ref 96–106)
Creatinine, Ser: 0.81 mg/dL (ref 0.76–1.27)
GFR calc Af Amer: 105 mL/min/{1.73_m2} (ref >59)
GFR calc non Af Amer: 91 mL/min/{1.73_m2} (ref >59)
Globulin, Total: 2.5 g/dL (ref 1.5–4.5)
Glucose: 88 mg/dL (ref 65–99)
Potassium: 5 mmol/L (ref 3.5–5.2)
Sodium: 130 mmol/L — ABNORMAL LOW (ref 134–144)
Total Protein: 6.8 g/dL (ref 6.0–8.5)

## 2018-11-15 LAB — CBC WITH DIFFERENTIAL/PLATELET
Basophils Absolute: 0.1 10*3/uL (ref 0.0–0.2)
Basos: 1 %
EOS (ABSOLUTE): 0.5 10*3/uL — ABNORMAL HIGH (ref 0.0–0.4)
Eos: 7 %
Hematocrit: 36.1 % — ABNORMAL LOW (ref 37.5–51.0)
Hemoglobin: 12.9 g/dL — ABNORMAL LOW (ref 13.0–17.7)
Immature Grans (Abs): 0 10*3/uL (ref 0.0–0.1)
Immature Granulocytes: 0 %
Lymphocytes Absolute: 1.5 10*3/uL (ref 0.7–3.1)
Lymphs: 24 %
MCH: 32.6 pg (ref 26.6–33.0)
MCHC: 35.7 g/dL (ref 31.5–35.7)
MCV: 91 fL (ref 79–97)
Monocytes Absolute: 0.7 10*3/uL (ref 0.1–0.9)
Monocytes: 12 %
Neutrophils Absolute: 3.5 10*3/uL (ref 1.4–7.0)
Neutrophils: 56 %
Platelets: 236 10*3/uL (ref 150–450)
RBC: 3.96 x10E6/uL — ABNORMAL LOW (ref 4.14–5.80)
RDW: 12.3 % (ref 11.6–15.4)
WBC: 6.3 10*3/uL (ref 3.4–10.8)

## 2018-11-15 LAB — PSA: Prostate Specific Ag, Serum: 0.3 ng/mL (ref 0.0–4.0)

## 2018-11-15 LAB — LIPID PANEL
Chol/HDL Ratio: 2.3 ratio (ref 0.0–5.0)
Cholesterol, Total: 174 mg/dL (ref 100–199)
HDL: 76 mg/dL (ref >39)
LDL Calculated: 88 mg/dL (ref 0–99)
Triglycerides: 50 mg/dL (ref 0–149)
VLDL Cholesterol Cal: 10 mg/dL (ref 5–40)

## 2018-11-16 NOTE — Progress Notes (Signed)
Chief Complaint  Patient presents with  . Annual Exam    nonfasting annual exam/med check. No new concerns. Has not seen urologist in 2 years.     Wayne Brandt is a 68 y.o. male who presents for a complete physical and med check. He had medicare portion done virtually a few months ago (due to COVID-19 pandemic).  Popeye injury R arm 07/2018, under care of Dr. Burney Gauze.  It isn't bothersome to him, no treatment recommended.  Tore likely from the shoulder, and pain in the shoulder has actually improved since this tear occurred.  Hypertension:  He is compliant with losartan 50mg  once daily.  He denies side effects.  Blood pressures are good, per pt, can't recall exact numbers, recalls seeing 120's/70's, "something like that, varies", can't recall details.  Forgot his sheet at home. He didn't take his BP med this morning--was at surgical center for EGD and dilatation, but they didn't have the equipment.  Had to reschedule to next week.  He denies headaches, dizziness, cough, shortness of breath, swelling.  BPH: s/p TURP 05/2016 by Dr. Karsten Ro.His incontinence post-surgically has completely resolved.  He gets up 3-4x/night, related to drinking tea during the day, beer in the evenings. This is not bothersome to him, hasn't changed. Peyronie's diseaseand erectile issues. Asymptomatic with respect to Peyronie's. No treatment was recommended. He is not in a sexual relationship with his wife (due to her health issues, fibromyalgia, etc.). He only follows with him prn (not yearly), not seen in about 2 years.  H/o RAD/asthma.Only has problems at Christmas (at his ex-wife's house with pets, and then at his son's house), uses an inhaler with good results. Denies needing inhaler any other time.  Eosinophilic esophagitis: He had been havingintermittent episodesof dysphagia.3 weeks ago he started having more trouble with swallowing foods again (when it flares, can be any foods); currently is doing a  little better.  He went this morning for EGD with possible dilatation but was postponed until next week (didn't have the equipment). Denies any heartburn (very rare at night, relieved by drinking water). He has previously been prescribed an inhaled steroid (Flovent), directed to SWALLOW, to help with esophageal symptoms.He stopped taking it because it was expensive and didn't help at all.   Aortic atherosclerosis:  Noted on CXR in 2017.  Last year it was recommended that he start taking ASA 81mg . He takes baby aspirin "just about every day". He declined statin.  HTN is now being treated, for risk factor modification, per above.   Vitamin D deficiency (mild):  Level was low at 27 lin 2016, normal(33) in 2017. He is currently taking 1 daily, unsure of the dose.    Immunization History  Administered Date(s) Administered  . Influenza Whole 01/05/2008  . Influenza, High Dose Seasonal PF 02/06/2016, 06/21/2017, 02/16/2018  . Influenza,inj,Quad PF,6+ Mos 02/01/2013, 05/28/2015  . Pneumococcal Conjugate-13 07/04/2015  . Pneumococcal Polysaccharide-23 06/01/2007, 07/09/2016  . Td 06/20/1997, 07/21/2017  . Tdap 01/05/2008  . Zoster 02/25/2012   Last colonoscopy: 02/2014; pt states told 10 years Last PSA:11/2018, 0.3 Dentist: twice yearly  Ophtho: yearly; has a cataract in the R eye that may need surgery. Vision is fine with a contact for the right. Exercise: daily (elliptical, bike) for 20-30 minutes; hasn't been able to go to the gym during stay-at-home order related to coronavirus pandemic.  He remains active also playing tennis, golf.   Past Medical History:  Diagnosis Date  . Actinic keratosis    Dr. Renda Rolls  .  Arthritis    wrists, L thumb  . BPH (benign prostatic hyperplasia)   . ED (erectile dysfunction)   . Family history of premature CAD   . HH (hiatus hernia)   . History of basal cell carcinoma excision    2013-- chest area  . History of esophagogastroduodenoscopy (EGD)  12/06/2017   Dr.Medoff-distal esophageal stricture,hiatal hernia,gastritis and duodenitis.  . Hypertension   . Lower urinary tract symptoms (LUTS)   . Mild intermittent asthma   . Peyronie's disease   . Polio    dx 1956  effected left leg  . S/P dilatation of esophageal stricture    multiple times  . SCC (squamous cell carcinoma) 2020   face  . Urinary retention     Past Surgical History:  Procedure Laterality Date  . ANKLE FUSION Left 1997  . BAND HEMORRHOIDECTOMY     Dr. Earlean Shawl  . CARDIOVASCULAR STRESS TEST  2006   normal--Dr. Percival Spanish  . COLONOSCOPY, ESOPHAGOGASTRODUODENOSCOPY (EGD) AND ESOPHAGEAL DILATION  02/15/2014   Dr.Medoff  . ESOPHAGEAL DILATION  02/08/2017   Dr. Earlean Shawl.  EGD showed stricture and HH  . ESOPHAGOGASTRODUODENOSCOPY  02/04/2017  . ESOPHAGOGASTRODUODENOSCOPY ENDOSCOPY  02/08/2017  . I & D RIGHT KNEE W/ REMOVAL PATELLA WIRES  02/09/2007  . KNEE ARTHROSCOPY Right 07/07/2000   post-op  07-09-2000  right knee arthroscopy w/ lavage and debridement hemathrosis/ pyathrosis  . LEFT FOOT SURGERY  x2  as child   polio  . NEUROPLASTY / TRANSPOSITION ULNAR NERVE AT ELBOW Right 01/04/2008  . ORIF RIGHT PERIPROSTHEITC PATELLA FX  11/01/2006  . TOTAL KNEE ARTHROPLASTY Right 02/22/2006  . TRANSURETHRAL RESECTION OF PROSTATE N/A 06/08/2016   Procedure: TRANSURETHRAL RESECTION OF THE PROSTATE (TURP);  Surgeon: Kathie Rhodes, MD;  Location: [facility];  Service: Urology;  Laterality: N/A;  . WRIST ARTHROSCOPY WITH DEBRIDEMENT Right 03/30/2001    Social History   Socioeconomic History  . Marital status: Married    Spouse name: Not on file  . Number of children: 1  . Years of education: Not on file  . Highest education level: Not on file  Occupational History  . 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 Financial resource strain: Not on file  . Food insecurity    Worry: Not on file    Inability: Not  on file  . Transportation needs    Medical: Not on file    Non-medical: Not on file  Tobacco Use  . Smoking status: Never Smoker  . Smokeless tobacco: Never Used  Substance and Sexual Activity  . Alcohol use: Yes    Comment: 1-2 beers/day  . Drug use: No  . Sexual activity: Not Currently    Partners: Female  Lifestyle  . Physical activity    Days per week: Not on file    Minutes per session: Not on file  . Stress: Not on file  Relationships  . Social Herbalist on phone: Not on file    Gets together: Not on file    Attends religious service: Not on file    Active member of club or organization: Not on file    Attends meetings of clubs or organizations: Not on file    Relationship status: Not on file  . Intimate partner violence    Fear of current or ex partner: Not on file    Emotionally abused: Not on file    Physically abused: Not  on file    Forced sexual activity: Not on file  Other Topics Concern  . Not on file  Social History Narrative   Lives with wife. Son from a previous marriage lives in [place].  1 granddaughter.    Retired 04/15/2018    Family History  Problem Relation Age of Onset  . Alcohol abuse Mother   . Cancer Mother        male cancer in 46's  . Heart disease Father 21       MI  . Heart disease Brother 20       MI  . Glaucoma Brother   . Heart disease Brother 48       stents  . Diabetes Neg Hx   . Colon cancer Neg Hx     Outpatient Encounter Medications as of 11/17/2018  Medication Sig Note  . aspirin EC 81 MG tablet Take 81 mg by mouth daily. 11/17/2018: Takes most days  . Cholecalciferol (VITAMIN D-3) 1000 units CAPS Take 1 capsule by mouth daily.    . fish oil-omega-3 fatty acids 1000 MG capsule Take 1 g by mouth daily.    Marland Kitchen losartan (COZAAR) 50 MG tablet Take 1 tablet (50 mg total) by mouth daily.   . Pyridoxine HCl (VITAMIN B-6) 500 MG tablet Take 500 mg by mouth daily.     . TURMERIC PO Take 1,900 mg by mouth daily.   . vitamin  B-12 (CYANOCOBALAMIN) 1000 MCG tablet Take 1,000 mcg by mouth daily.   . vitamin C (ASCORBIC ACID) 500 MG tablet Take 500 mg by mouth daily.   . [DISCONTINUED] losartan (COZAAR) 50 MG tablet TAKE 1 TABLET BY MOUTH EVERY DAY   . albuterol (PROVENTIL HFA;VENTOLIN HFA) 108 (90 BASE) MCG/ACT inhaler Inhale 2 puffs into the lungs every 6 (six) hours as needed for wheezing. Use 30 mins prior to exercise (Patient not taking: Reported on 07/09/2016) 07/28/2018: Only needs to use it around Christmas (going to his son's house or his mother's (related to candles/scents/pets).  . naproxen sodium (ALEVE) 220 MG tablet Take 220 mg by mouth. 11/17/2018: Takes 2 pills every 4-5 days (shoulder pain, wrists)   No facility-administered encounter medications on file as of 11/17/2018.     No Known Allergies  ROS: The patient denies anorexia, fever, headaches, vision loss, hoarseness, chest pain, palpitations, dizziness, syncope, dyspnea on exertion, swelling, nausea, vomiting, diarrhea, constipation, abdominal pain, melena, hematochezia, hematuria, dysuria, weakness, tremor, suspicious skin lesions (sees derm, Dr.Haverstockregularly), depression, anxiety, abnormal bleeding/bruising, or enlarged lymph nodes  H/o microscopic hematuria--no visible blood per pt  No rectal bleeding since getting hemorrhoidal banding  Occasional tingling R hand from ulnar nerve issue (chronic)--intermittent, overall doing better.Taking B6 has helped a lot. Worse in cold weather. No weakness. Occasional L thumb pain, worse with golfing(known arthritis), occasional bilateral wrist pain (can no longer do pushups); Right shoulder pain--improved some after the tear of bicep muscle.  Up 3-4x/night to void. Denies hesitancy, weakened stream. Dysphagia per HPI. Rare heartburn. Has residual ringing in left ear (since the problems he had last year). Sees dermatology at least once a year.    PHYSICAL EXAM:  BP 140/80   Pulse 60   Temp 98.2 F  (36.8 C) (Tympanic)   Ht 5\' 8"  (1.727 m)   Wt 172 lb 9.6 oz (78.3 kg)   BMI 26.24 kg/m   Wt Readings from Last 3 Encounters:  11/17/18 172 lb 9.6 oz (78.3 kg)  07/28/18 164 lb (74.4 kg)  05/18/18 168 lb 9.6 oz (76.5 kg)    General Appearance:  Alert, cooperative, no distress, appears stated age   Head:  Normocephalic, atraumatic  Eyes:  PERRL, conjunctiva/corneas clear, EOM's intact, fundi benign   Ears:  TM's and EACs are normal  Nose:  Not examined (wearing mask due to COVID-19 pandemic)  Throat:  Not examined (wearing mask due to COVID-19 pandemic)   Neck:  Supple, no lymphadenopathy; thyroid: no enlargement/tenderness/ nodules; no carotidbruit or JVD   Back:  Spine nontender, no curvature, ROM normal, no CVA tenderness   Lungs:  Clear to auscultation bilaterally without wheezes, rales or ronchi; respirations unlabored   Chest Wall:  No tenderness or deformity   Heart:  Regular rate and rhythm, S1 and S2 normal, no murmur, rub or gallop   Breast Exam:  No chest wall tenderness, masses or gynecomastia   Abdomen:  Soft, non-tender, nondistended, normoactive bowel sounds, no masses, no hepatosplenomegaly   Genitalia:  Circumcised penis, no rash, lesions, discharge. Testicles are normal, no masses.  No hernias.  Rectal:  Normal sphincter tone, no mass.  Heme negative stool.  Prostate is smooth, not enlarged, no nodules. Small external tag on the left  Extremities:  No clubbing, cyanosis or edema. +leg length discrepancy--left is shorter than right leg (since L ankle fusion per pt). L ankle fused; atrophy of LLE; WHSS R knee, swollen, warm, no erythema, nontender (chronic per pt, ices every night). Thickening and onycholytic changes to many nails. Bulge (popeye deformity) noted in RUE with contraction of biceps muscle  Pulses:  2+ and symmetric all extremities   Skin:  Skin color, texture, turgor normal, no rashes. Actinic damage  throughout, AK right hand near webspace. Discolored toenails. Bruising on hands/forearms, purpura, thin skin  Lymph nodes:  Cervical, supraclavicular, and axillary nodes normal   Neurologic:  CNII-XII intact, normal strength, sensation and gait; reflexes 2+ and symmetric throughout   Psych: Normal mood, affect, hygiene and grooming      Chemistry      Component Value Date/Time   NA 130 (L) 11/14/2018 0815   K 5.0 11/14/2018 0815   CL 92 (L) 11/14/2018 0815   CO2 24 11/14/2018 0815   BUN 17 11/14/2018 0815   CREATININE 0.81 11/14/2018 0815   CREATININE 0.77 07/09/2016 0843      Component Value Date/Time   CALCIUM 9.2 11/14/2018 0815   ALKPHOS 96 11/14/2018 0815   AST 34 11/14/2018 0815   ALT 19 11/14/2018 0815   BILITOT 0.3 11/14/2018 0815     Fasting glu 88  Lab Results  Component Value Date   WBC 6.3 11/14/2018   HGB 12.9 (L) 11/14/2018   HCT 36.1 (L) 11/14/2018   MCV 91 11/14/2018   PLT 236 11/14/2018   Lab Results  Component Value Date   CHOL 174 11/14/2018   HDL 76 11/14/2018   LDLCALC 88 11/14/2018   TRIG 50 11/14/2018   CHOLHDL 2.3 11/14/2018   PSA 0.3  ASSESSMENT/PLAN: Annual physical exam  Essential hypertension - borderline today, hasn't taken meds yet.  To send list of BP's; discussed goals. Cont losartan - Plan: losartan (COZAAR) 50 MG tablet  Aortic atherosclerosis (HCC) - cont ASA   Senile purpura (HCC)  Actinic keratosis - noted on hand; under care of dermatologist. Discussed proper sunscreen use   Recommended at least 30 minutes of aerobic activity at least 5 days/week, weight-bearing exercise at least 2x/wk; proper sunscreen use reviewed; healthy diet and alcohol recommendations (less than  or equal to 2 drinks/day) reviewed; regular seatbelt use; changing batteries in smoke detectors. Immunization recommendations discussed--continue high dose flu shots yearly. Shingrix recommended, to get from pharmacy, risks/SE  reviewed. Colonoscopy recommendations reviewed--UTD.   F/u 1 year, sooner if BP's elevated

## 2018-11-16 NOTE — Patient Instructions (Addendum)
  HEALTH MAINTENANCE RECOMMENDATIONS:  It is recommended that you get at least 30 minutes of aerobic exercise at least 5 days/week (for weight loss, you may need as much as 60-90 minutes). This can be any activity that gets your heart rate up. This can be divided in 10-15 minute intervals if needed, but try and build up your endurance at least once a week.  Weight bearing exercise is also recommended twice weekly.  Eat a healthy diet with lots of vegetables, fruits and fiber.  "Colorful" foods have a lot of vitamins (ie green vegetables, tomatoes, red peppers, etc).  Limit sweet tea, regular sodas and alcoholic beverages, all of which has a lot of calories and sugar.  Up to 2 alcoholic drinks daily may be beneficial for men (unless trying to lose weight, watch sugars).  Drink a lot of water.  Sunscreen of at least SPF 30 should be used on all sun-exposed parts of the skin when outside between the hours of 10 am and 4 pm (not just when at beach or pool, but even with exercise, golf, tennis, and yard work!)  Use a sunscreen that says "broad spectrum" so it covers both UVA and UVB rays, and make sure to reapply every 1-2 hours.  Remember to change the batteries in your smoke detectors when changing your clock times in the spring and fall. Carbon monoxide detectors are recommended for your home.  Use your seat belt every time you are in a car, and please drive safely and not be distracted with cell phones and texting while driving.  I recommend getting the new shingles vaccine (Shingrix). This should be covered by Medicare Part D, so you need to get from the pharmacy rather than our office.  It is a series of 2 injections, spaced 2 months apart.  Send [place] a list of your blood pressures when you get a chance.  Talk to Dr. Earlean Shawl about what you can be doing to prevent recurrent dysphagia/strictures (?inhaled Flovent/flonase, prilosec or other reflux treatment).

## 2018-11-17 ENCOUNTER — Ambulatory Visit (INDEPENDENT_AMBULATORY_CARE_PROVIDER_SITE_OTHER): Payer: Medicare Other | Admitting: Family Medicine

## 2018-11-17 ENCOUNTER — Encounter: Payer: Self-pay | Admitting: Family Medicine

## 2018-11-17 ENCOUNTER — Other Ambulatory Visit: Payer: Self-pay

## 2018-11-17 VITALS — BP 140/80 | HR 60 | Temp 98.2°F | Ht 68.0 in | Wt 172.6 lb

## 2018-11-17 DIAGNOSIS — I10 Essential (primary) hypertension: Secondary | ICD-10-CM | POA: Diagnosis not present

## 2018-11-17 DIAGNOSIS — Z0000 Encounter for general adult medical examination without abnormal findings: Secondary | ICD-10-CM

## 2018-11-17 DIAGNOSIS — D692 Other nonthrombocytopenic purpura: Secondary | ICD-10-CM | POA: Diagnosis not present

## 2018-11-17 DIAGNOSIS — I700 Atherosclerosis of aorta: Secondary | ICD-10-CM

## 2018-11-17 DIAGNOSIS — L570 Actinic keratosis: Secondary | ICD-10-CM | POA: Diagnosis not present

## 2018-11-17 MED ORDER — LOSARTAN POTASSIUM 50 MG PO TABS: 50.0000 mg | ORAL_TABLET | Freq: Every day | ORAL | 1 refills | Status: DC

## 2018-11-21 DIAGNOSIS — K222 Esophageal obstruction: Secondary | ICD-10-CM | POA: Diagnosis not present

## 2018-11-21 DIAGNOSIS — K449 Diaphragmatic hernia without obstruction or gangrene: Secondary | ICD-10-CM | POA: Diagnosis not present

## 2018-11-21 DIAGNOSIS — K2981 Duodenitis with bleeding: Secondary | ICD-10-CM | POA: Diagnosis not present

## 2018-11-21 DIAGNOSIS — K2930 Chronic superficial gastritis without bleeding: Secondary | ICD-10-CM | POA: Diagnosis not present

## 2018-11-21 DIAGNOSIS — K2950 Unspecified chronic gastritis without bleeding: Secondary | ICD-10-CM | POA: Diagnosis not present

## 2018-11-21 DIAGNOSIS — K2980 Duodenitis without bleeding: Secondary | ICD-10-CM | POA: Diagnosis not present

## 2018-11-21 DIAGNOSIS — R1310 Dysphagia, unspecified: Secondary | ICD-10-CM | POA: Diagnosis not present

## 2018-11-21 HISTORY — PX: UPPER GI ENDOSCOPY: SHX6162

## 2018-11-22 DIAGNOSIS — H1031 Unspecified acute conjunctivitis, right eye: Secondary | ICD-10-CM | POA: Diagnosis not present

## 2018-12-07 ENCOUNTER — Encounter: Payer: Self-pay | Admitting: *Deleted

## 2018-12-09 ENCOUNTER — Encounter: Payer: Self-pay | Admitting: Family Medicine

## 2018-12-12 ENCOUNTER — Encounter: Payer: Self-pay | Admitting: Family Medicine

## 2018-12-15 ENCOUNTER — Telehealth: Payer: Self-pay | Admitting: Orthopedic Surgery

## 2018-12-15 NOTE — Telephone Encounter (Signed)
Patient called requesting an RX for an anti inflammatory.  Patient uses [facility] on [address].  CB#[phone].  Thank you

## 2018-12-16 ENCOUNTER — Other Ambulatory Visit: Payer: Self-pay

## 2018-12-16 MED ORDER — MELOXICAM 7.5 MG PO TABS: 7.5000 mg | ORAL_TABLET | Freq: Two times a day (BID) | ORAL | 1 refills | Status: DC

## 2018-12-16 NOTE — Telephone Encounter (Signed)
Ok to prescribe

## 2018-12-16 NOTE — Telephone Encounter (Signed)
Called into pharmacy

## 2018-12-16 NOTE — Telephone Encounter (Signed)
Okay for Mobic 7.5 mg 1 p.o. daily to twice daily as needed pain #60 with no refills

## 2018-12-23 ENCOUNTER — Ambulatory Visit: Payer: Medicare Other | Admitting: Orthopedic Surgery

## 2019-01-10 ENCOUNTER — Ambulatory Visit (INDEPENDENT_AMBULATORY_CARE_PROVIDER_SITE_OTHER): Payer: Medicare Other | Admitting: Orthopedic Surgery

## 2019-01-10 ENCOUNTER — Other Ambulatory Visit: Payer: Self-pay

## 2019-01-10 ENCOUNTER — Encounter: Payer: Self-pay | Admitting: Orthopedic Surgery

## 2019-01-10 DIAGNOSIS — M75101 Unspecified rotator cuff tear or rupture of right shoulder, not specified as traumatic: Secondary | ICD-10-CM

## 2019-01-10 DIAGNOSIS — M12811 Other specific arthropathies, not elsewhere classified, right shoulder: Secondary | ICD-10-CM

## 2019-01-10 MED ORDER — LIDOCAINE HCL 1 % IJ SOLN
5.0000 mL | INTRAMUSCULAR | Status: AC | PRN
  Administered 2019-01-10: 5 mL

## 2019-01-10 MED ORDER — BUPIVACAINE HCL 0.5 % IJ SOLN
9.0000 mL | INTRAMUSCULAR | Status: AC | PRN
  Administered 2019-01-10: 9 mL via INTRA_ARTICULAR

## 2019-01-10 MED ORDER — METHYLPREDNISOLONE ACETATE 40 MG/ML IJ SUSP
40.0000 mg | INTRAMUSCULAR | Status: AC | PRN
  Administered 2019-01-10: 40 mg via INTRA_ARTICULAR

## 2019-01-10 NOTE — Progress Notes (Signed)
Office Visit Note   Patient: Wayne Brandt           Date of Birth: Jun 20, 1950           MRN: [id number] Visit Date: 01/10/2019 Requested by: Rita Ohara, [address] [place],  [place] [address] PCP: Rita Ohara, MD  Subjective: No chief complaint on file.   HPI: Wayne Brandt is a patient with right shoulder pain.  He is reporting pain out with coughing as well as with tennis.  Had prior injection about 6 months ago.  Shortly after that injection he developed a Popeye deformity in his right arm.  3 weeks ago he was playing tennis and he felt some anterior pain.  Now he is having some weakness.  He is on Mobic which helps.  Left arm functions very well.              ROS: All systems reviewed are negative as they relate to the chief complaint within the history of present illness.  Patient denies  fevers or chills.   Assessment & Plan: Visit Diagnoses:  1. Rotator cuff tear arthropathy of right shoulder     Plan: Impression is probable partial subscap rupture right shoulder.  He still has forward flexion abduction above 90.  Particularly forward flexion.  Plan at this time is subacromial injection for pain relief with holding off all activities for 4 weeks.  I will see him back in about 6 or 8 weeks if he is not improved.  We did discuss to some degree surgical intervention in terms of shoulder replacement.  Follow-Up Instructions: Return if symptoms worsen or fail to improve.   Orders:  No orders of the defined types were placed in this encounter.  No orders of the defined types were placed in this encounter.     Procedures: Large Joint Inj: R glenohumeral on 01/10/2019 3:37 PM Indications: diagnostic evaluation and pain Details: 18 G 1.5 in needle, posterior approach  Arthrogram: No  Medications: 9 mL bupivacaine 0.5 %; 40 mg methylPREDNISolone acetate 40 MG/ML; 5 mL lidocaine 1 % Outcome: tolerated well, no immediate complications Procedure, treatment alternatives,  risks and benefits explained, specific risks discussed. Consent was given by the patient. Immediately prior to procedure a time out was called to verify the correct patient, procedure, equipment, support staff and site/side marked as required. Patient was prepped and draped in the usual sterile fashion.       Clinical Data: No additional findings.  Objective: Vital Signs: There were no vitals taken for this visit.  Physical Exam:   Constitutional: Patient appears well-developed HEENT:  Head: Normocephalic Eyes:EOM are normal Neck: Normal range of motion Cardiovascular: Normal rate Pulmonary/chest: Effort normal Neurologic: Patient is alert Skin: Skin is warm Psychiatric: Patient has normal mood and affect    Ortho Exam: Ortho exam demonstrates full active and passive range of motion of the cervical spine.  Well-healed surgical incision on the right shoulder where prior suprascapular nerve decompression was performed.  Patient has some weakness with subscap testing on the right compared to the left.  Patient does have forward flexion abduction above 90 degrees.  Forward flexion especially is easily above 90. Specialty Comments:  No specialty comments available.  Imaging: No results found.   PMFS History: Patient Active Problem List   Diagnosis Date Noted  . Senile purpura ([place]) 07/22/2017  . BPH with urinary obstruction 06/08/2016  . Family history of heart disease in male family member before age 57 04/09/2016  .  Aortic atherosclerosis ([place]) 04/09/2016  . Essential hypertension 04/09/2016  . Vitamin D deficiency 06/29/2014  . Exercise-induced asthma 06/28/2014  . Eosinophilic esophagitis [id number]  . Peyronie disease 06/26/2013  . Allergic rhinitis 06/26/2013  . Microscopic hematuria 12/03/2011   Past Medical History:  Diagnosis Date  . Actinic keratosis    Dr. Renda Rolls  . Arthritis    wrists, L thumb  . BPH (benign prostatic hyperplasia)   . ED (erectile  dysfunction)   . Family history of premature CAD   . HH (hiatus hernia)   . History of basal cell carcinoma excision    2013-- chest area  . History of esophagogastroduodenoscopy (EGD) 12/06/2017   Dr.Medoff-distal esophageal stricture,hiatal hernia,gastritis and duodenitis.  . Hypertension   . Lower urinary tract symptoms (LUTS)   . Mild intermittent asthma   . Peyronie's disease   . Polio    dx 1956  effected left leg  . S/P dilatation of esophageal stricture    multiple times  . SCC (squamous cell carcinoma) 2020   face  . Urinary retention     Family History  Problem Relation Age of Onset  . Alcohol abuse Mother   . Cancer Mother        male cancer in 31's  . Heart disease Father 25       MI  . Heart disease Brother 24       MI  . Glaucoma Brother   . Heart disease Brother 48       stents  . Diabetes Neg Hx   . Colon cancer Neg Hx     Past Surgical History:  Procedure Laterality Date  . ANKLE FUSION Left 1997  . BAND HEMORRHOIDECTOMY     Dr. Earlean Shawl  . CARDIOVASCULAR STRESS TEST  2006   normal--Dr. Percival Spanish  . COLONOSCOPY, ESOPHAGOGASTRODUODENOSCOPY (EGD) AND ESOPHAGEAL DILATION  02/15/2014   Dr.Medoff  . ESOPHAGEAL DILATION  02/08/2017   Dr. Earlean Shawl.  EGD showed stricture and HH  . ESOPHAGOGASTRODUODENOSCOPY  02/04/2017  . ESOPHAGOGASTRODUODENOSCOPY ENDOSCOPY  02/08/2017  . I & D RIGHT KNEE W/ REMOVAL PATELLA WIRES  02/09/2007  . KNEE ARTHROSCOPY Right 07/07/2000   post-op  07-09-2000  right knee arthroscopy w/ lavage and debridement hemathrosis/ pyathrosis  . LEFT FOOT SURGERY  x2  as child   polio  . NEUROPLASTY / TRANSPOSITION ULNAR NERVE AT ELBOW Right 01/04/2008  . ORIF RIGHT PERIPROSTHEITC PATELLA FX  11/01/2006  . TOTAL KNEE ARTHROPLASTY Right 02/22/2006  . TRANSURETHRAL RESECTION OF PROSTATE N/A 06/08/2016   Procedure: TRANSURETHRAL RESECTION OF THE PROSTATE (TURP);  Surgeon: Kathie Rhodes, MD;  Location: [facility];  Service:  Urology;  Laterality: N/A;  . UPPER GI ENDOSCOPY  11/21/2018   hiatal hernia, gastritis, duodenitis  . WRIST ARTHROSCOPY WITH DEBRIDEMENT Right 03/30/2001   Social History   Occupational History  . 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 Smoking status: Never Smoker  . Smokeless tobacco: Never Used  Substance and Sexual Activity  . Alcohol use: Yes    Comment: 1-2 beers/day  . Drug use: No  . Sexual activity: Not Currently    Partners: Female

## 2019-01-13 ENCOUNTER — Ambulatory Visit: Payer: Medicare Other | Admitting: Orthopedic Surgery

## 2019-01-16 ENCOUNTER — Telehealth: Payer: Self-pay | Admitting: Orthopedic Surgery

## 2019-01-16 DIAGNOSIS — M75101 Unspecified rotator cuff tear or rupture of right shoulder, not specified as traumatic: Secondary | ICD-10-CM

## 2019-01-16 DIAGNOSIS — M12811 Other specific arthropathies, not elsewhere classified, right shoulder: Secondary | ICD-10-CM

## 2019-01-16 NOTE — Telephone Encounter (Signed)
Please advise. Thanks.  Last OV note mentioned surgery.

## 2019-01-16 NOTE — Telephone Encounter (Signed)
Patient called back again stating that he wanted to added a couple of questions for when Dr. Marlou Sa or you call him back.  Thank you.

## 2019-01-16 NOTE — Telephone Encounter (Signed)
Patient called advised his right shoulder has gotten much worse. Patient asked if he can get a call back to discuss. The number to contact patient is [phone]

## 2019-01-17 NOTE — Telephone Encounter (Signed)
Patient asking for you to please call him. See also other note I sent you regarding patient.

## 2019-01-17 NOTE — Telephone Encounter (Signed)
Holding this to make sure Dr Marlou Sa discusses with patient. Several other messages sent to Dr Marlou Sa.

## 2019-01-17 NOTE — Telephone Encounter (Signed)
Patient calling to discuss a surgery date for his right shoulder.  He states the inject he received the end of September offered no relief and pain has become worse.  He said he'd like to ask  Dr. Marlou Sa a few questions about the surgery and then proceed with a replacement.      Patient's cb [phone]

## 2019-01-18 NOTE — Telephone Encounter (Signed)
Okay for

## 2019-01-18 NOTE — Telephone Encounter (Signed)
Scan ordered

## 2019-01-26 ENCOUNTER — Ambulatory Visit
Admission: RE | Admit: 2019-01-26 | Discharge: 2019-01-26 | Disposition: A | Discharge status: Home / Self Care | Payer: Medicare Other | Source: Ambulatory Visit | Attending: Orthopedic Surgery | Admitting: Orthopedic Surgery

## 2019-01-26 ENCOUNTER — Telehealth: Payer: Self-pay | Admitting: Family Medicine

## 2019-01-26 DIAGNOSIS — Z01818 Encounter for other preprocedural examination: Secondary | ICD-10-CM | POA: Diagnosis not present

## 2019-01-26 DIAGNOSIS — N529 Male erectile dysfunction, unspecified: Secondary | ICD-10-CM

## 2019-01-26 DIAGNOSIS — M19011 Primary osteoarthritis, right shoulder: Secondary | ICD-10-CM | POA: Diagnosis not present

## 2019-01-26 DIAGNOSIS — M75101 Unspecified rotator cuff tear or rupture of right shoulder, not specified as traumatic: Secondary | ICD-10-CM

## 2019-01-26 DIAGNOSIS — M12811 Other specific arthropathies, not elsewhere classified, right shoulder: Secondary | ICD-10-CM

## 2019-01-26 NOTE — Telephone Encounter (Signed)
Please clarify with pt (It has been a couple of years since we last prescribed this)--in the past we gave the generic 20mg  tablet, with instructions to take 2-5 tablets once daily prn erectile dysfunction.  Many pharmacies offer pricing deals for certain quantities (10 vs 30 vs 90 or 100).  See what quantity he would like, then okay to fill (if 30, can put 11 refills, if 90, put 3 refills).  Thanks

## 2019-01-26 NOTE — Telephone Encounter (Signed)
Pt called and wanted to know if he could get a new prescription for Sildenafil sent to the [facility] on [address].

## 2019-01-26 NOTE — Telephone Encounter (Signed)
Called pt. Had to LM told him to call back tomorrow concerning quantity and pharmacy for the prescription for Sildenafil.

## 2019-01-27 ENCOUNTER — Other Ambulatory Visit: Payer: Self-pay

## 2019-01-27 DIAGNOSIS — R05 Cough: Secondary | ICD-10-CM

## 2019-01-27 DIAGNOSIS — J45990 Exercise induced bronchospasm: Secondary | ICD-10-CM

## 2019-01-27 DIAGNOSIS — R059 Cough, unspecified: Secondary | ICD-10-CM

## 2019-01-27 MED ORDER — SILDENAFIL CITRATE 20 MG PO TABS: ORAL_TABLET | ORAL | 11 refills | Status: DC

## 2019-01-27 NOTE — Telephone Encounter (Signed)
Pt. Called back stating he would like the sildenafil refilled to [facility] for #30. If that is ok I can refill it for him.

## 2019-02-01 ENCOUNTER — Other Ambulatory Visit: Payer: Self-pay

## 2019-02-01 ENCOUNTER — Ambulatory Visit (INDEPENDENT_AMBULATORY_CARE_PROVIDER_SITE_OTHER): Payer: Medicare Other | Admitting: Orthopedic Surgery

## 2019-02-01 DIAGNOSIS — M19011 Primary osteoarthritis, right shoulder: Secondary | ICD-10-CM

## 2019-02-01 DIAGNOSIS — M12811 Other specific arthropathies, not elsewhere classified, right shoulder: Secondary | ICD-10-CM | POA: Diagnosis not present

## 2019-02-02 ENCOUNTER — Encounter: Payer: Self-pay | Admitting: Orthopedic Surgery

## 2019-02-02 NOTE — Progress Notes (Signed)
Office Visit Note   Patient: Wayne Brandt           Date of Birth: Sep 30, 1950           MRN: [id number] Visit Date: 02/01/2019 Requested by: Rita Ohara, [address] [place],  [place] [address] PCP: Rita Ohara, MD  Subjective: Chief Complaint  Patient presents with  . Follow-up    HPI: Wayne Brandt is a 68 y.o. male who presents to the office complaining of right shoulder pain.  Patient returns to the clinic to discuss right shoulder CT scan.  Reverse shoulder arthroplasty was discussed at the last visit.  Patient notes right shoulder pain as his main complaint.  He denies any significant weakness or stiffness.  He has good functional motion of the right shoulder, able to go above his head easily.  He does not have any pain that constantly wakes him up at night.  Patient's main complaint is pain with his golf and tennis swings.  Patient has taken Mobic in the past but stopped taking it due to increased blood pressure.              ROS:  All systems reviewed are negative as they relate to the chief complaint within the history of present illness.  Patient denies fevers or chills.  Assessment & Plan: Visit Diagnoses:  1. Osteoarthritis of glenohumeral joint, right   2. Rotator cuff arthropathy of right shoulder     Plan: Patient is a 68 year old male who presents for review of CT scan results of the right shoulder.  CT scan reveals atrophy of the infraspinatus as well as severe arthritis of the glenohumeral joint.  Discussed options available to the patient.  He has decent strength on exam, but he still has some subscapularis weakness though this does seem improved from his last visit.  He has excellent range of motion.  If patient were to proceed with surgery, he would require a reverse shoulder arthroplasty.  Discussed the 15 pound lifetime lifting limit.  After lengthy discussion of the risks and benefits of surgery, patient states that he would really only go ahead  with surgery if he wanted to play more pain-free golf and tennis.  Patient decided against surgery, opting to have a trial of rest and activity modification to see the effect it has on his right shoulder pain.  Patient will follow up with the office as needed.  Patient has pretty good function with that right arm.  Do not think he really wants to go the route of shoulder replacement yet due to potential limitation and restriction on activity.  If that changes in his pain and loss of function worsen and he may consider replacement.  We will see him back as needed  Follow-Up Instructions: No follow-ups on file.   Orders:  No orders of the defined types were placed in this encounter.  No orders of the defined types were placed in this encounter.     Procedures: No procedures performed   Clinical Data: No additional findings.  Objective: Vital Signs: There were no vitals taken for this visit.  Physical Exam:  Constitutional: Patient appears well-developed HEENT:  Head: Normocephalic Eyes:EOM are normal Neck: Normal range of motion Cardiovascular: Normal rate Pulmonary/chest: Effort normal Neurologic: Patient is alert Skin: Skin is warm Psychiatric: Patient has normal mood and affect  Ortho Exam:  Right shoulder Exam Able to forward flex and abduct shoulder overhead No loss of ER relative to the other  shoulder.  Good endpoint with ER No TTP over the [facility] joint or bicipital groove 5/5 motor strength of the supraspinatus and infraspinatus.  4/5 motor strength of the subscapularis muscle.  Pain with subscapularis resistance testing. 5/5 grip strength, forearm pronation/supination, and bicep strength  Specialty Comments:  No specialty comments available.  Imaging: No results found.   PMFS History: Patient Active Problem List   Diagnosis Date Noted  . Senile purpura ([place]) 07/22/2017  . BPH with urinary obstruction 06/08/2016  . Family history of heart disease in male family  member before age 22 04/09/2016  . Aortic atherosclerosis ([place]) 04/09/2016  . Essential hypertension 04/09/2016  . Vitamin D deficiency 06/29/2014  . Exercise-induced asthma 06/28/2014  . Eosinophilic esophagitis [id number]  . Peyronie disease 06/26/2013  . Allergic rhinitis 06/26/2013  . Microscopic hematuria 12/03/2011   Past Medical History:  Diagnosis Date  . Actinic keratosis    Dr. Renda Rolls  . Arthritis    wrists, L thumb  . BPH (benign prostatic hyperplasia)   . ED (erectile dysfunction)   . Family history of premature CAD   . HH (hiatus hernia)   . History of basal cell carcinoma excision    2013-- chest area  . History of esophagogastroduodenoscopy (EGD) 12/06/2017   Dr.Medoff-distal esophageal stricture,hiatal hernia,gastritis and duodenitis.  . Hypertension   . Lower urinary tract symptoms (LUTS)   . Mild intermittent asthma   . Peyronie's disease   . Polio    dx 1956  effected left leg  . S/P dilatation of esophageal stricture    multiple times  . SCC (squamous cell carcinoma) 2020   face  . Urinary retention     Family History  Problem Relation Age of Onset  . Alcohol abuse Mother   . Cancer Mother        male cancer in 35's  . Heart disease Father 13       MI  . Heart disease Brother 35       MI  . Glaucoma Brother   . Heart disease Brother 48       stents  . Diabetes Neg Hx   . Colon cancer Neg Hx     Past Surgical History:  Procedure Laterality Date  . ANKLE FUSION Left 1997  . BAND HEMORRHOIDECTOMY     Dr. Earlean Shawl  . CARDIOVASCULAR STRESS TEST  2006   normal--Dr. Percival Spanish  . COLONOSCOPY, ESOPHAGOGASTRODUODENOSCOPY (EGD) AND ESOPHAGEAL DILATION  02/15/2014   Dr.Medoff  . ESOPHAGEAL DILATION  02/08/2017   Dr. Earlean Shawl.  EGD showed stricture and HH  . ESOPHAGOGASTRODUODENOSCOPY  02/04/2017  . ESOPHAGOGASTRODUODENOSCOPY ENDOSCOPY  02/08/2017  . I & D RIGHT KNEE W/ REMOVAL PATELLA WIRES  02/09/2007  . KNEE ARTHROSCOPY Right 07/07/2000    post-op  07-09-2000  right knee arthroscopy w/ lavage and debridement hemathrosis/ pyathrosis  . LEFT FOOT SURGERY  x2  as child   polio  . NEUROPLASTY / TRANSPOSITION ULNAR NERVE AT ELBOW Right 01/04/2008  . ORIF RIGHT PERIPROSTHEITC PATELLA FX  11/01/2006  . TOTAL KNEE ARTHROPLASTY Right 02/22/2006  . TRANSURETHRAL RESECTION OF PROSTATE N/A 06/08/2016   Procedure: TRANSURETHRAL RESECTION OF THE PROSTATE (TURP);  Surgeon: Kathie Rhodes, MD;  [facility];  Service: Urology;  Laterality: N/A;  . UPPER GI ENDOSCOPY  11/21/2018   hiatal hernia, gastritis, duodenitis  . WRIST ARTHROSCOPY WITH DEBRIDEMENT Right 03/30/2001   Social History   Occupational History  . 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 Smoking status: Never Smoker  . Smokeless tobacco: Never Used  Substance and Sexual Activity  . Alcohol use: Yes    Comment: 1-2 beers/day  . Drug use: No  . Sexual activity: Not Currently    Partners: Female

## 2019-02-06 DIAGNOSIS — Z0120 Encounter for dental examination and cleaning without abnormal findings: Secondary | ICD-10-CM | POA: Diagnosis not present

## 2019-02-08 DIAGNOSIS — N486 Induration penis plastica: Secondary | ICD-10-CM | POA: Diagnosis not present

## 2019-02-08 DIAGNOSIS — N5201 Erectile dysfunction due to arterial insufficiency: Secondary | ICD-10-CM | POA: Diagnosis not present

## 2019-02-09 ENCOUNTER — Encounter: Payer: Self-pay | Admitting: Orthopedic Surgery

## 2019-02-15 DIAGNOSIS — M25511 Pain in right shoulder: Secondary | ICD-10-CM | POA: Diagnosis not present

## 2019-03-17 NOTE — Patient Instructions (Signed)
DUE TO COVID-19 ONLY ONE VISITOR IS ALLOWED TO COME WITH YOU AND STAY IN THE WAITING ROOM ONLY DURING PRE OP AND PROCEDURE DAY OF SURGERY. THE 1 VISITOR MAY VISIT WITH YOU AFTER SURGERY IN YOUR PRIVATE ROOM DURING VISITING HOURS ONLY!   ONCE YOUR COVID TEST IS COMPLETED, PLEASE BEGIN THE QUARANTINE INSTRUCTIONS AS OUTLINED IN YOUR HANDOUT.                Wayne Brandt   Your procedure is scheduled on: 03/23/19   Report to [facility] Main  Entrance   Report to admitting at   7:30 AM     Call this number if you have problems the morning of surgery [place], NO CHEWING GUM [place].   Do not eat food After Midnight.   YOU MAY HAVE CLEAR LIQUIDS FROM MIDNIGHT UNTIL 7:00 AM   . At 7:00 AM Please finish the prescribed Pre-Surgery  drink.   Nothing by mouth after you finish the  drink !    Take these medicines the morning of surgery with A SIP OF WATER: Prilosec. Use your inhaler and bring it to the hospital                                 You may not have any metal on your body including piercings              Do not wear jewelry, make-up, lotions, powders or perfumes, deodorant             Men may shave face and neck.   Do not bring valuables to the hospital. [place].  Contacts, dentures or bridgework may not be worn into surgery.       Patients discharged the day of surgery will not be allowed to drive home . IF YOU ARE HAVING SURGERY AND GOING HOME THE SAME DAY, YOU MUST HAVE AN ADULT TO DRIVE YOU HOME AND BE WITH YOU FOR 24 HOURS.  YOU MAY GO HOME BY TAXI OR UBER OR ORTHERWISE, BUT AN ADULT MUST ACCOMPANY YOU HOME AND STAY WITH YOU FOR 24 HOURS.  Name and phone number of your driver:  Special Instructions: N/A              Please read over the following fact sheets you were  given: _____________________________________________________________________             [facility] - Preparing for Surgery  Before surgery, you can play an important role.   Because skin is not sterile, your skin needs to be as free of germs as possible .  You can reduce the number of germs on your skin by washing with CHG (chlorahexidine gluconate) soap before surgery.   CHG is an antiseptic cleaner which kills germs and bonds with the skin to continue killing germs even after washing. Please DO NOT use if you have an allergy to CHG or antibacterial soaps .  If your skin becomes reddened/irritated stop using the CHG and inform your nurse when you arrive at Short Stay.   You may shave your face/neck . Please follow these instructions carefully:  1.  Shower with CHG Soap the night before surgery and the  morning of  Surgery.  2.  If you choose to wash your hair, wash your hair first as usual with your  normal  shampoo.  3.  After you shampoo, rinse your hair and body thoroughly to remove the  shampoo.                                        4.  Use CHG as you would any other liquid soap.  You can apply chg directly  to the skin and wash                       Gently with a scrungie or clean washcloth.  5.  Apply the CHG Soap to your body ONLY FROM THE NECK DOWN.   Do not use on face/ open                           Wound or open sores. Avoid contact with eyes, ears mouth and genitals (private parts).                       Wash face,  Genitals (private parts) with your normal soap.             6.  Wash thoroughly, paying special attention to the area where your surgery  will be performed.  7.  Thoroughly rinse your body with warm water from the neck down.  8.  DO NOT shower/wash with your normal soap after using and rinsing off  the CHG Soap.             9.  Pat yourself dry with a clean towel.            10.  Wear clean pajamas.            11.  Place clean sheets on your bed the night of  your first shower and do not  sleep with pets. Day of Surgery : Do not apply any lotions/deodorants the morning of surgery.  Please wear clean clothes to the hospital/surgery center.  FAILURE TO FOLLOW THESE INSTRUCTIONS MAY RESULT IN THE CANCELLATION OF YOUR SURGERY PATIENT SIGNATURE_________________________________  NURSE SIGNATURE__________________________________  ________________________________________________________________________

## 2019-03-20 ENCOUNTER — Other Ambulatory Visit (HOSPITAL_COMMUNITY)
Admission: RE | Admit: 2019-03-20 | Discharge: 2019-03-20 | Disposition: A | Discharge status: Home / Self Care | Payer: Medicare Other | Source: Ambulatory Visit | Attending: Orthopedic Surgery | Admitting: Orthopedic Surgery

## 2019-03-20 DIAGNOSIS — K219 Gastro-esophageal reflux disease without esophagitis: Secondary | ICD-10-CM | POA: Diagnosis not present

## 2019-03-20 DIAGNOSIS — I10 Essential (primary) hypertension: Secondary | ICD-10-CM | POA: Diagnosis not present

## 2019-03-20 DIAGNOSIS — Z9079 Acquired absence of other genital organ(s): Secondary | ICD-10-CM | POA: Diagnosis not present

## 2019-03-20 DIAGNOSIS — M75121 Complete rotator cuff tear or rupture of right shoulder, not specified as traumatic: Secondary | ICD-10-CM | POA: Diagnosis not present

## 2019-03-20 DIAGNOSIS — M75101 Unspecified rotator cuff tear or rupture of right shoulder, not specified as traumatic: Secondary | ICD-10-CM | POA: Diagnosis not present

## 2019-03-20 DIAGNOSIS — N400 Enlarged prostate without lower urinary tract symptoms: Secondary | ICD-10-CM | POA: Diagnosis not present

## 2019-03-20 DIAGNOSIS — Z79899 Other long term (current) drug therapy: Secondary | ICD-10-CM | POA: Diagnosis not present

## 2019-03-20 DIAGNOSIS — Z20828 Contact with and (suspected) exposure to other viral communicable diseases: Secondary | ICD-10-CM | POA: Diagnosis not present

## 2019-03-20 DIAGNOSIS — G8918 Other acute postprocedural pain: Secondary | ICD-10-CM | POA: Diagnosis not present

## 2019-03-20 DIAGNOSIS — E559 Vitamin D deficiency, unspecified: Secondary | ICD-10-CM | POA: Diagnosis not present

## 2019-03-20 DIAGNOSIS — Z85828 Personal history of other malignant neoplasm of skin: Secondary | ICD-10-CM | POA: Diagnosis not present

## 2019-03-20 DIAGNOSIS — M19011 Primary osteoarthritis, right shoulder: Secondary | ICD-10-CM | POA: Diagnosis not present

## 2019-03-20 DIAGNOSIS — Z7982 Long term (current) use of aspirin: Secondary | ICD-10-CM | POA: Diagnosis not present

## 2019-03-20 DIAGNOSIS — Z96651 Presence of right artificial knee joint: Secondary | ICD-10-CM | POA: Diagnosis not present

## 2019-03-21 ENCOUNTER — Encounter (HOSPITAL_COMMUNITY)
Admission: RE | Admit: 2019-03-21 | Discharge: 2019-03-21 | Disposition: A | Discharge status: Home / Self Care | Payer: Medicare Other | Source: Ambulatory Visit | Attending: Orthopedic Surgery | Admitting: Orthopedic Surgery

## 2019-03-21 ENCOUNTER — Encounter (HOSPITAL_COMMUNITY): Payer: Self-pay

## 2019-03-21 ENCOUNTER — Other Ambulatory Visit: Payer: Self-pay

## 2019-03-21 HISTORY — DX: Gastro-esophageal reflux disease without esophagitis: K21.9

## 2019-03-21 LAB — CBC
HCT: 36.9 % — ABNORMAL LOW (ref 39.0–52.0)
Hemoglobin: 12.8 g/dL — ABNORMAL LOW (ref 13.0–17.0)
MCH: 32.7 pg (ref 26.0–34.0)
MCHC: 34.7 g/dL (ref 30.0–36.0)
MCV: 94.1 fL (ref 80.0–100.0)
Platelets: 259 10*3/uL (ref 150–400)
RBC: 3.92 MIL/uL — ABNORMAL LOW (ref 4.22–5.81)
RDW: 13.1 % (ref 11.5–15.5)
WBC: 9.1 10*3/uL (ref 4.0–10.5)
nRBC: 0 % (ref 0.0–0.2)

## 2019-03-21 LAB — BASIC METABOLIC PANEL
Anion gap: 8 (ref 5–15)
BUN: 13 mg/dL (ref 8–23)
CO2: 27 mmol/L (ref 22–32)
Calcium: 9.1 mg/dL (ref 8.9–10.3)
Chloride: 91 mmol/L — ABNORMAL LOW (ref 98–111)
Creatinine, Ser: 0.75 mg/dL (ref 0.61–1.24)
GFR calc Af Amer: >60 mL/min (ref >60)
GFR calc non Af Amer: >60 mL/min (ref >60)
Glucose, Bld: 98 mg/dL (ref 70–99)
Potassium: 4.8 mmol/L (ref 3.5–5.1)
Sodium: 126 mmol/L — ABNORMAL LOW (ref 135–145)

## 2019-03-21 LAB — SURGICAL PCR SCREEN
MRSA, PCR: NEGATIVE
Staphylococcus aureus: POSITIVE — AB

## 2019-03-21 LAB — NOVEL CORONAVIRUS, NAA (HOSP ORDER, SEND-OUT TO REF LAB; TAT 18-24 HRS): SARS-CoV-2, NAA: NOT DETECTED

## 2019-03-22 NOTE — Anesthesia Preprocedure Evaluation (Addendum)
Anesthesia Evaluation  Patient identified by MRN, date of birth, ID band Patient awake    Reviewed: Allergy & Precautions, NPO status , Patient's Chart, lab work & pertinent test results  Airway Mallampati: II  TM Distance: >3 FB Neck ROM: Full    Dental no notable dental hx. (+) Teeth Intact, Dental Advisory Given   Pulmonary neg pulmonary ROS,    Pulmonary exam normal breath sounds clear to auscultation       Cardiovascular hypertension, + Peripheral Vascular Disease  negative cardio ROS Normal cardiovascular exam Rhythm:Regular Rate:Normal     Neuro/Psych negative neurological ROS  negative psych ROS   GI/Hepatic negative GI ROS, Neg liver ROS, hiatal hernia, GERD  Medicated,  Endo/Other  negative endocrine ROS  Renal/GU negative Renal ROS  negative genitourinary   Musculoskeletal negative musculoskeletal ROS (+) Arthritis ,   Abdominal   Peds  Hematology negative hematology ROS (+)   Anesthesia Other Findings   Reproductive/Obstetrics                           Anesthesia Physical Anesthesia Plan  ASA: II  Anesthesia Plan: General and Regional   Post-op Pain Management:  Regional for Post-op pain   Induction: Intravenous  PONV Risk Score and Plan: 2 and Midazolam, Dexamethasone and Ondansetron  Airway Management Planned: Oral ETT  Additional Equipment:   Intra-op Plan:   Post-operative Plan: Extubation in OR  Informed Consent: I have reviewed the patients History and Physical, chart, labs and discussed the procedure including the risks, benefits and alternatives for the proposed anesthesia with the patient or authorized representative who has indicated his/her understanding and acceptance.     Dental advisory given  Plan Discussed with: CRNA  Anesthesia Plan Comments:         Anesthesia Quick Evaluation

## 2019-03-23 ENCOUNTER — Inpatient Hospital Stay (HOSPITAL_COMMUNITY)
Admission: RE | Admit: 2019-03-23 | Discharge: 2019-03-23 | DRG: 483 | Disposition: A | Discharge status: Home / Self Care | Payer: Medicare Other | Attending: Orthopedic Surgery | Admitting: Orthopedic Surgery

## 2019-03-23 ENCOUNTER — Encounter (HOSPITAL_COMMUNITY): Payer: Self-pay | Admitting: Orthopedic Surgery

## 2019-03-23 ENCOUNTER — Inpatient Hospital Stay (HOSPITAL_COMMUNITY): Payer: Medicare Other | Admitting: Physician Assistant

## 2019-03-23 ENCOUNTER — Encounter (HOSPITAL_COMMUNITY)
Admission: RE | Disposition: A | Discharge status: Home / Self Care | Payer: Self-pay | Source: Home / Self Care | Attending: Orthopedic Surgery

## 2019-03-23 ENCOUNTER — Inpatient Hospital Stay (HOSPITAL_COMMUNITY): Payer: Medicare Other | Admitting: Anesthesiology

## 2019-03-23 DIAGNOSIS — Z79899 Other long term (current) drug therapy: Secondary | ICD-10-CM | POA: Diagnosis not present

## 2019-03-23 DIAGNOSIS — N400 Enlarged prostate without lower urinary tract symptoms: Secondary | ICD-10-CM | POA: Diagnosis present

## 2019-03-23 DIAGNOSIS — M75101 Unspecified rotator cuff tear or rupture of right shoulder, not specified as traumatic: Secondary | ICD-10-CM | POA: Diagnosis present

## 2019-03-23 DIAGNOSIS — Z85828 Personal history of other malignant neoplasm of skin: Secondary | ICD-10-CM

## 2019-03-23 DIAGNOSIS — Z9079 Acquired absence of other genital organ(s): Secondary | ICD-10-CM

## 2019-03-23 DIAGNOSIS — I10 Essential (primary) hypertension: Secondary | ICD-10-CM | POA: Diagnosis present

## 2019-03-23 DIAGNOSIS — Z7982 Long term (current) use of aspirin: Secondary | ICD-10-CM

## 2019-03-23 DIAGNOSIS — Z20828 Contact with and (suspected) exposure to other viral communicable diseases: Secondary | ICD-10-CM | POA: Diagnosis present

## 2019-03-23 DIAGNOSIS — K219 Gastro-esophageal reflux disease without esophagitis: Secondary | ICD-10-CM | POA: Diagnosis present

## 2019-03-23 DIAGNOSIS — Z96651 Presence of right artificial knee joint: Secondary | ICD-10-CM | POA: Diagnosis present

## 2019-03-23 HISTORY — PX: REVERSE SHOULDER ARTHROPLASTY: SHX5054

## 2019-03-23 SURGERY — ARTHROPLASTY, SHOULDER, TOTAL, REVERSE
Anesthesia: Regional | Site: Shoulder | Laterality: Right

## 2019-03-23 MED ORDER — PHENYLEPHRINE HCL-NACL 10-0.9 MG/250ML-% IV SOLN
INTRAVENOUS | Status: DC | PRN
  Administered 2019-03-23: 30 ug/min via INTRAVENOUS

## 2019-03-23 MED ORDER — ACETAMINOPHEN 500 MG PO TABS
1000.0000 mg | ORAL_TABLET | Freq: Once | ORAL | Status: AC
  Administered 2019-03-23: 1000 mg via ORAL
  Filled 2019-03-23: qty 2

## 2019-03-23 MED ORDER — CHLORHEXIDINE GLUCONATE 4 % EX LIQD: 60.0000 mL | Freq: Once | CUTANEOUS | Status: DC

## 2019-03-23 MED ORDER — EPHEDRINE SULFATE-NACL 50-0.9 MG/10ML-% IV SOSY
PREFILLED_SYRINGE | INTRAVENOUS | Status: DC | PRN
  Administered 2019-03-23: 10 mg via INTRAVENOUS

## 2019-03-23 MED ORDER — CEFAZOLIN SODIUM-DEXTROSE 2-4 GM/100ML-% IV SOLN
2.0000 g | INTRAVENOUS | Status: AC
  Administered 2019-03-23: 2 g via INTRAVENOUS
  Filled 2019-03-23: qty 100

## 2019-03-23 MED ORDER — LIDOCAINE 2% (20 MG/ML) 5 ML SYRINGE
INTRAMUSCULAR | Status: DC | PRN
  Administered 2019-03-23: 60 mg via INTRAVENOUS

## 2019-03-23 MED ORDER — LIDOCAINE 2% (20 MG/ML) 5 ML SYRINGE
INTRAMUSCULAR | Status: AC
  Filled 2019-03-23: qty 10

## 2019-03-23 MED ORDER — PHENYLEPHRINE 40 MCG/ML (10ML) SYRINGE FOR IV PUSH (FOR BLOOD PRESSURE SUPPORT)
PREFILLED_SYRINGE | INTRAVENOUS | Status: AC
  Filled 2019-03-23: qty 30

## 2019-03-23 MED ORDER — TRANEXAMIC ACID-NACL 1000-0.7 MG/100ML-% IV SOLN
1000.0000 mg | INTRAVENOUS | Status: AC
  Administered 2019-03-23: 1000 mg via INTRAVENOUS
  Filled 2019-03-23: qty 100

## 2019-03-23 MED ORDER — BUPIVACAINE HCL (PF) 0.5 % IJ SOLN
INTRAMUSCULAR | Status: DC | PRN
  Administered 2019-03-23: 15 mL via PERINEURAL

## 2019-03-23 MED ORDER — ROCURONIUM BROMIDE 10 MG/ML (PF) SYRINGE
PREFILLED_SYRINGE | INTRAVENOUS | Status: DC | PRN
  Administered 2019-03-23: 5 mg via INTRAVENOUS
  Administered 2019-03-23: 40 mg via INTRAVENOUS
  Administered 2019-03-23 (×2): 10 mg via INTRAVENOUS

## 2019-03-23 MED ORDER — FENTANYL CITRATE (PF) 250 MCG/5ML IJ SOLN
INTRAMUSCULAR | Status: AC
  Filled 2019-03-23: qty 5

## 2019-03-23 MED ORDER — MIDAZOLAM HCL 2 MG/2ML IJ SOLN
1.0000 mg | Freq: Once | INTRAMUSCULAR | Status: AC
  Administered 2019-03-23: 2 mg via INTRAVENOUS
  Filled 2019-03-23: qty 2

## 2019-03-23 MED ORDER — BUPIVACAINE LIPOSOME 1.3 % IJ SUSP
INTRAMUSCULAR | Status: DC | PRN
  Administered 2019-03-23: 10 mL via PERINEURAL

## 2019-03-23 MED ORDER — SODIUM CHLORIDE 0.9 % IR SOLN
Status: DC | PRN
  Administered 2019-03-23: 1000 mL

## 2019-03-23 MED ORDER — SUCCINYLCHOLINE CHLORIDE 200 MG/10ML IV SOSY
PREFILLED_SYRINGE | INTRAVENOUS | Status: AC
  Filled 2019-03-23: qty 30

## 2019-03-23 MED ORDER — FENTANYL CITRATE (PF) 100 MCG/2ML IJ SOLN
INTRAMUSCULAR | Status: DC | PRN
  Administered 2019-03-23: 100 ug via INTRAVENOUS

## 2019-03-23 MED ORDER — PROPOFOL 10 MG/ML IV BOLUS
INTRAVENOUS | Status: AC
  Filled 2019-03-23: qty 20

## 2019-03-23 MED ORDER — OXYCODONE-ACETAMINOPHEN 5-325 MG PO TABS: 1.0000 | ORAL_TABLET | ORAL | 0 refills | Status: DC | PRN

## 2019-03-23 MED ORDER — FENTANYL CITRATE (PF) 100 MCG/2ML IJ SOLN: 25.0000 ug | INTRAMUSCULAR | Status: DC | PRN

## 2019-03-23 MED ORDER — ONDANSETRON HCL 4 MG/2ML IJ SOLN
INTRAMUSCULAR | Status: DC | PRN
  Administered 2019-03-23: 4 mg via INTRAVENOUS

## 2019-03-23 MED ORDER — LABETALOL HCL 5 MG/ML IV SOLN
5.0000 mg | Freq: Once | INTRAVENOUS | Status: AC
  Administered 2019-03-23: 5 mg via INTRAVENOUS

## 2019-03-23 MED ORDER — EPHEDRINE 5 MG/ML INJ
INTRAVENOUS | Status: AC
  Filled 2019-03-23: qty 10

## 2019-03-23 MED ORDER — SUGAMMADEX SODIUM 200 MG/2ML IV SOLN
INTRAVENOUS | Status: DC | PRN
  Administered 2019-03-23: 200 mg via INTRAVENOUS

## 2019-03-23 MED ORDER — ROCURONIUM BROMIDE 10 MG/ML (PF) SYRINGE
PREFILLED_SYRINGE | INTRAVENOUS | Status: AC
  Filled 2019-03-23: qty 20

## 2019-03-23 MED ORDER — SUCCINYLCHOLINE CHLORIDE 20 MG/ML IJ SOLN
INTRAMUSCULAR | Status: DC | PRN
  Administered 2019-03-23: 140 mg via INTRAVENOUS

## 2019-03-23 MED ORDER — PROPOFOL 10 MG/ML IV BOLUS
INTRAVENOUS | Status: DC | PRN
  Administered 2019-03-23: 150 mg via INTRAVENOUS

## 2019-03-23 MED ORDER — LACTATED RINGERS IV SOLN
INTRAVENOUS | Status: DC
  Administered 2019-03-23 (×2): via INTRAVENOUS

## 2019-03-23 MED ORDER — LABETALOL HCL 5 MG/ML IV SOLN
INTRAVENOUS | Status: AC
  Filled 2019-03-23: qty 4

## 2019-03-23 MED ORDER — ONDANSETRON HCL 4 MG PO TABS: 4.0000 mg | ORAL_TABLET | Freq: Three times a day (TID) | ORAL | 0 refills | Status: DC | PRN

## 2019-03-23 MED ORDER — CYCLOBENZAPRINE HCL 10 MG PO TABS: 10.0000 mg | ORAL_TABLET | Freq: Three times a day (TID) | ORAL | 1 refills | Status: DC | PRN

## 2019-03-23 MED ORDER — PHENYLEPHRINE 40 MCG/ML (10ML) SYRINGE FOR IV PUSH (FOR BLOOD PRESSURE SUPPORT)
PREFILLED_SYRINGE | INTRAVENOUS | Status: DC | PRN
  Administered 2019-03-23: 80 ug via INTRAVENOUS

## 2019-03-23 MED ORDER — DEXAMETHASONE SODIUM PHOSPHATE 10 MG/ML IJ SOLN
INTRAMUSCULAR | Status: DC | PRN
  Administered 2019-03-23: 10 mg via INTRAVENOUS

## 2019-03-23 MED ORDER — FENTANYL CITRATE (PF) 100 MCG/2ML IJ SOLN
50.0000 ug | Freq: Once | INTRAMUSCULAR | Status: AC
  Administered 2019-03-23: 50 ug via INTRAVENOUS
  Filled 2019-03-23: qty 2

## 2019-03-23 SURGICAL SUPPLY — 78 items
ADH SKN CLS APL DERMABOND .7 (GAUZE/BANDAGES/DRESSINGS) ×1
AID PSTN UNV HD RSTRNT DISP (MISCELLANEOUS) ×1
BAG SPEC THK2 15X12 ZIP CLS (MISCELLANEOUS) ×1
BAG ZIPLOCK 12X15 (MISCELLANEOUS) ×2 IMPLANT
BLADE SAW SGTL 83.5X18.5 (BLADE) ×2 IMPLANT
BSPLAT GLND +2X24 MDLR (Joint) ×1 IMPLANT
COOLER ICEMAN CLASSIC (MISCELLANEOUS) ×1 IMPLANT
COVER BACK TABLE 60X90IN (DRAPES) ×2 IMPLANT
COVER SURGICAL LIGHT HANDLE (MISCELLANEOUS) ×2 IMPLANT
COVER WAND RF STERILE (DRAPES) IMPLANT
CUP SUT UNIV REVERS 39 NEU (Shoulder) ×1 IMPLANT
DERMABOND ADVANCED (GAUZE/BANDAGES/DRESSINGS) ×1
DERMABOND ADVANCED .7 DNX12 (GAUZE/BANDAGES/DRESSINGS) ×1 IMPLANT
DRAPE INCISE IOBAN 66X45 STRL (DRAPES) IMPLANT
DRAPE ORTHO SPLIT 77X108 STRL (DRAPES) ×4
DRAPE SHEET LG 3/4 BI-LAMINATE (DRAPES) ×2 IMPLANT
DRAPE SURG 17X11 SM STRL (DRAPES) ×2 IMPLANT
DRAPE SURG ORHT 6 SPLT 77X108 (DRAPES) ×2 IMPLANT
DRAPE U-SHAPE 47X51 STRL (DRAPES) ×2 IMPLANT
DRESSING AQUACEL AG SP 3.5X10 (GAUZE/BANDAGES/DRESSINGS) IMPLANT
DRSG AQUACEL AG ADV 3.5X10 (GAUZE/BANDAGES/DRESSINGS) ×2 IMPLANT
DRSG AQUACEL AG SP 3.5X10 (GAUZE/BANDAGES/DRESSINGS) ×2
DURAPREP 26ML APPLICATOR (WOUND CARE) ×2 IMPLANT
ELECT BLADE TIP CTD 4 INCH (ELECTRODE) ×2 IMPLANT
ELECT REM PT RETURN 15FT ADLT (MISCELLANEOUS) ×2 IMPLANT
FACESHIELD WRAPAROUND (MASK) ×8 IMPLANT
FACESHIELD WRAPAROUND OR TEAM (MASK) ×4 IMPLANT
GLENOID UNI REV MOD 24 +2 LAT (Joint) ×1 IMPLANT
GLENOSPHERE 39+4 LAT/24 UNI RV (Joint) ×1 IMPLANT
GLOVE BIO SURGEON STRL SZ7.5 (GLOVE) ×2 IMPLANT
GLOVE BIO SURGEON STRL SZ8 (GLOVE) ×2 IMPLANT
GLOVE SS BIOGEL STRL SZ 7 (GLOVE) ×1 IMPLANT
GLOVE SS BIOGEL STRL SZ 7.5 (GLOVE) ×1 IMPLANT
GLOVE SUPERSENSE BIOGEL SZ 7 (GLOVE) ×1
GLOVE SUPERSENSE BIOGEL SZ 7.5 (GLOVE) ×1
GLOVE SURG SYN 7.0 (GLOVE) ×2 IMPLANT
GLOVE SURG SYN 7.0 PF PI (GLOVE) IMPLANT
GLOVE SURG SYN 7.5  E (GLOVE) ×2
GLOVE SURG SYN 7.5 E (GLOVE) ×2 IMPLANT
GLOVE SURG SYN 7.5 PF PI (GLOVE) IMPLANT
GLOVE SURG SYN 8.0 (GLOVE) ×2 IMPLANT
GLOVE SURG SYN 8.0 PF PI (GLOVE) IMPLANT
GOWN STRL REUS W/TWL LRG LVL3 (GOWN DISPOSABLE) ×4 IMPLANT
INSERT HUMERAL 39/+6 (Insert) ×1 IMPLANT
KIT BASIN OR (CUSTOM PROCEDURE TRAY) ×2 IMPLANT
KIT TURNOVER KIT A (KITS) IMPLANT
MANIFOLD NEPTUNE II (INSTRUMENTS) ×2 IMPLANT
NDL TAPERED W/ NITINOL LOOP (MISCELLANEOUS) ×1 IMPLANT
NEEDLE TAPERED W/ NITINOL LOOP (MISCELLANEOUS) ×2 IMPLANT
NS IRRIG 1000ML POUR BTL (IV SOLUTION) ×2 IMPLANT
PACK SHOULDER (CUSTOM PROCEDURE TRAY) ×2 IMPLANT
PAD ARMBOARD 7.5X6 YLW CONV (MISCELLANEOUS) ×2 IMPLANT
PAD COLD SHLDR WRAP-ON (PAD) ×1 IMPLANT
PENCIL SMOKE EVACUATOR (MISCELLANEOUS) IMPLANT
PIN SET MODULAR GLENOID SYSTEM (PIN) ×1 IMPLANT
RESTRAINT HEAD UNIVERSAL NS (MISCELLANEOUS) ×2 IMPLANT
SCREW CENTRAL MOD 30MM (Screw) ×1 IMPLANT
SCREW PERI LOCK 5.5X36 (Screw) ×2 IMPLANT
SCREW PERIPHERAL 5.5X20 LOCK (Screw) ×2 IMPLANT
SLING ARM FOAM STRAP LRG (SOFTGOODS) ×1 IMPLANT
SLING ARM FOAM STRAP MED (SOFTGOODS) IMPLANT
SPONGE LAP 18X18 RF (DISPOSABLE) IMPLANT
STEM HUMERAL UNI REVERSE SZ10 (Stem) ×1 IMPLANT
SUCTION FRAZIER HANDLE 12FR (TUBING) ×1
SUCTION TUBE FRAZIER 12FR DISP (TUBING) ×1 IMPLANT
SUT FIBERWIRE #2 38 T-5 BLUE (SUTURE)
SUT MNCRL AB 3-0 PS2 18 (SUTURE) ×2 IMPLANT
SUT MON AB 2-0 CT1 36 (SUTURE) ×2 IMPLANT
SUT VIC AB 1 CT1 36 (SUTURE) ×2 IMPLANT
SUT VIC AB 2-0 CT1 27 (SUTURE) ×2
SUT VIC AB 2-0 CT1 TAPERPNT 27 (SUTURE) IMPLANT
SUTURE FIBERWR #2 38 T-5 BLUE (SUTURE) IMPLANT
SUTURE TAPE 1.3 40 TPR END (SUTURE) ×2 IMPLANT
SUTURETAPE 1.3 40 TPR END (SUTURE) ×4
TOWEL OR 17X26 10 PK STRL BLUE (TOWEL DISPOSABLE) ×2 IMPLANT
TOWEL OR NON WOVEN STRL DISP B (DISPOSABLE) ×2 IMPLANT
WATER STERILE IRR 1000ML POUR (IV SOLUTION) ×4 IMPLANT
YANKAUER SUCT BULB TIP 10FT TU (MISCELLANEOUS) ×2 IMPLANT

## 2019-03-23 NOTE — Anesthesia Procedure Notes (Signed)
Procedure Name: Intubation Performed by: Emonte Dieujuste J, CRNA Pre-anesthesia Checklist: Patient identified, Emergency Drugs available, Suction available, Patient being monitored and Timeout performed Patient Re-evaluated:Patient Re-evaluated prior to induction Oxygen Delivery Method: Circle system utilized Preoxygenation: Pre-oxygenation with 100% oxygen Induction Type: IV induction Ventilation: Mask ventilation without difficulty Laryngoscope Size: Mac and 3 Grade View: Grade I Tube type: Oral Tube size: 7.5 mm Number of attempts: 1 Airway Equipment and Method: Stylet Placement Confirmation: ETT inserted through vocal cords under direct vision,  positive ETCO2,  CO2 detector and breath sounds checked- equal and bilateral Secured at: 23 cm Tube secured with: Tape Dental Injury: Teeth and Oropharynx as per pre-operative assessment        

## 2019-03-23 NOTE — Transfer of Care (Signed)
Immediate Anesthesia Transfer of Care Note  Patient: Wayne Brandt  Procedure(s) Performed: REVERSE SHOULDER ARTHROPLASTY (Right Shoulder)  Patient Location: PACU  Anesthesia Type:General  Level of Consciousness: sedated, patient cooperative and responds to stimulation  Airway & Oxygen Therapy: Patient Spontanous Breathing and Patient connected to face mask oxygen  Post-op Assessment: Report given to RN and Post -op Vital signs reviewed and stable  Post vital signs: Reviewed and stable  Last Vitals:  Vitals Value Taken Time  BP 208/103 03/23/19 1148  Temp    Pulse 69 03/23/19 1151  Resp 14 03/23/19 1151  SpO2 100 % 03/23/19 1151  Vitals shown include unvalidated device data.  Last Pain:  Vitals:   03/23/19 0753  TempSrc: Oral         Complications: No apparent anesthesia complications

## 2019-03-23 NOTE — Discharge Summary (Signed)
PATIENT ID:      Wayne Brandt  MRN:     [id number] DOB/AGE:    [birth year] / 68 y.o.     DISCHARGE SUMMARY  ADMISSION DATE:    03/23/2019 DISCHARGE DATE:    ADMISSION DIAGNOSIS: Right shoulder rotator cuff tear arthropathy Past Medical History:  Diagnosis Date  . Actinic keratosis    Dr. Renda Rolls  . Arthritis    wrists, L thumb  . BPH (benign prostatic hyperplasia)   . ED (erectile dysfunction)   . Family history of premature CAD   . GERD (gastroesophageal reflux disease)   . HH (hiatus hernia)   . History of basal cell carcinoma excision    2013-- chest area  . History of esophagogastroduodenoscopy (EGD) 12/06/2017   Dr.Medoff-distal esophageal stricture,hiatal hernia,gastritis and duodenitis.  . Hypertension   . Lower urinary tract symptoms (LUTS)   . Mild intermittent asthma   . Peyronie's disease   . Polio    dx 1956  effected left leg  . S/P dilatation of esophageal stricture    multiple times  . SCC (squamous cell carcinoma) 2020   face  . Urinary retention     DISCHARGE DIAGNOSIS:   Active Problems:   * No active hospital problems. *   PROCEDURE: Procedure(s): REVERSE SHOULDER ARTHROPLASTY on 03/23/2019  CONSULTS:    HISTORY:  See H&P in chart.  HOSPITAL COURSE:  Wayne Brandt is a 67 y.o. admitted on 03/23/2019 with a diagnosis of Right shoulder rotator cuff tear arthropathy.  They were brought to the operating room on 03/23/2019 and underwent Procedure(s): [place].    They were given perioperative antibiotics:  Anti-infectives (From admission, onward)   Start     Dose/Rate Route Frequency Ordered Stop   03/23/19 0800  ceFAZolin (ANCEF) IVPB 2g/100 mL premix     2 g 200 mL/hr over 30 Minutes Intravenous On call to O.R. 03/23/19 [id number] 03/23/19 1038    .  Patient underwent the above named procedure and tolerated it well.  There were kept and monitored in the recovery room and he recovered at an accelerated rate.  It was  felt that he was stable to be discharged to home to recover in the care of his family.  In the recovery room he remained stable with pain controlled with the nerve block.  Occupational therapy did see him work with patient and I reviewed all of his discharge instructions with him.  He was medically and orthopedically stable for discharge to home from the recovery room. Marland Kitchen    DIAGNOSTIC STUDIES:  RECENT RADIOGRAPHIC STUDIES :  No results found.  RECENT VITAL SIGNS:   Patient Vitals for the past 24 hrs:  BP Temp Temp src Pulse Resp SpO2  03/23/19 1245 138/81 (!) 97.5 F (36.4 C) - (!) 49 12 96 %  03/23/19 1230 (!) 143/80 (!) 97.4 F (36.3 C) - (!) 50 12 95 %  03/23/19 1215 (!) 145/81 (!) 96.1 F (35.6 C) - (!) 47 12 95 %  03/23/19 1200 (!) 170/85 - - 65 17 100 %  03/23/19 1150 (!) 204/96 (!) 96.4 F (35.8 C) - 67 20 100 %  03/23/19 0915 - - - (!) 56 14 100 %  03/23/19 0914 - - - (!) 56 18 100 %  03/23/19 0913 - - - (!) 56 10 100 %  03/23/19 0912 - - - (!) 55 11 100 %  03/23/19 0911 - - - (!) 55 11 100 %  03/23/19 0910 140/75 - - 71 17 100 %  03/23/19 0908 - - - (!) 55 (!) 7 100 %  03/23/19 0907 - - - (!) 55 12 100 %  03/23/19 0906 - - - (!) 58 10 100 %  03/23/19 0905 137/84 - - (!) 58 12 100 %  03/23/19 0904 - - - (!) 57 13 100 %  03/23/19 0903 - - - (!) 57 15 100 %  03/23/19 0902 - - - (!) 58 15 99 %  03/23/19 0901 - - - 61 13 100 %  03/23/19 0900 (!) 146/83 - - (!) 57 12 100 %  03/23/19 0753 (!) 141/82 97.9 F (36.6 C) Oral 60 18 100 %  .  RECENT EKG RESULTS:    Orders placed or performed during the hospital encounter of 03/21/19  . EKG 12 lead  . EKG 12 lead    DISCHARGE INSTRUCTIONS:    DISCHARGE MEDICATIONS:   Allergies as of 03/23/2019   No Known Allergies     Medication List    TAKE these medications   albuterol 108 (90 Base) MCG/ACT inhaler Commonly known as: VENTOLIN HFA Inhale 2 puffs into the lungs every 6 (six) hours as needed for wheezing. Use 30  mins prior to exercise   aspirin EC 325 MG tablet Take 162.5 mg by mouth daily.   cyclobenzaprine 10 MG tablet Commonly known as: FLEXERIL Take 1 tablet (10 mg total) by mouth 3 (three) times daily as needed for muscle spasms.   fish oil-omega-3 fatty acids 1000 MG capsule Take 1 g by mouth daily.   losartan 50 MG tablet Commonly known as: COZAAR Take 1 tablet (50 mg total) by mouth daily.   meloxicam 7.5 MG tablet Commonly known as: Mobic Take 1 tablet (7.5 mg total) by mouth 2 (two) times daily.   omeprazole 40 MG capsule Commonly known as: PRILOSEC Take 40 mg by mouth daily.   ondansetron 4 MG tablet Commonly known as: Zofran Take 1 tablet (4 mg total) by mouth every 8 (eight) hours as needed for nausea or vomiting.   oxyCODONE-acetaminophen 5-325 MG tablet Commonly known as: Percocet Take 1 tablet by mouth every 4 (four) hours as needed (max 6 q).   sildenafil 20 MG tablet Commonly known as: REVATIO Use 2-5 tablets by mouth once daily as needed for erectile dysfunction   vitamin B-12 1000 MCG tablet Commonly known as: CYANOCOBALAMIN Take 1,000 mcg by mouth daily.   vitamin B-6 500 MG tablet Take 500 mg by mouth daily.   Vitamin D-3 25 MCG (1000 UT) Caps Take 1,000 Units by mouth daily.       FOLLOW UP VISIT:   Follow-up Information    Justice Britain, MD.   Specialty: Orthopedic Surgery Why: call to be seen in 10-14 days Contact information: [address] [place] [address] [id number]           DISCHARGE TO: Home   DISCHARGE CONDITION:  Thereasa Parkin Onika Gudiel for Dr. Justice Britain 03/23/2019, 1:51 PM

## 2019-03-23 NOTE — Anesthesia Procedure Notes (Signed)
Anesthesia Regional Block: Interscalene brachial plexus block   Pre-Anesthetic Checklist: ,, timeout performed, Correct Patient, Correct Site, Correct Laterality, Correct Procedure, Correct Position, site marked, Risks and benefits discussed,  Surgical consent,  Pre-op evaluation,  At surgeon's request and post-op pain management  Laterality: Right  Prep: Maximum Sterile Barrier Precautions used, chloraprep       Needles:  Injection technique: Single-shot  Needle Type: Echogenic Stimulator Needle     Needle Length: 4cm  Needle Gauge: 22     Additional Needles:   Procedures:,,,, ultrasound used (permanent image in chart),,,,  Narrative:  Start time: 03/23/2019 9:00 AM End time: 03/23/2019 9:10 AM Injection made incrementally with aspirations every 5 mL.  Performed by: Personally  Anesthesiologist: Freddrick March, MD  Additional Notes: Monitors applied. No increased pain on injection. No increased resistance to injection. Injection made in 5cc increments. Good needle visualization. Patient tolerated procedure well.

## 2019-03-23 NOTE — Evaluation (Signed)
Occupational Therapy Evaluation Patient Details Name: Wayne Brandt MRN: [id number] DOB: [birth year] Today's Date: 03/23/2019    History of Present Illness 68 year old man s/p R RTSA.    Clinical Impression   Pt was admitted for the above sx. All education was completed. Pt is being discharged from PACU    Follow Up Recommendations  Follow surgeon's recommendation for DC plan and follow-up therapies    Equipment Recommendations  None recommended by OT    Recommendations for Other Services       Precautions / Restrictions Precautions Precautions: Shoulder Type of Shoulder Precautions: sling may be off in controlled environment; may perform lap slides, pendulums and elbow wrist and hand. May perform gentle pendulums Shoulder Interventions: Shoulder sling/immobilizer Precaution Booklet Issued: Yes (comment) Restrictions Other Position/Activity Restrictions: NWB R; R dominant      Mobility Bed Mobility                  Transfers                 General transfer comment: min guard for safety for sit to stand    Balance                                           ADL either performed or assessed with clinical judgement   ADL Overall ADL's : Needs assistance/impaired Eating/Feeding: Set up   Grooming: Minimal assistance   Upper Body Bathing: Moderate assistance   Lower Body Bathing: Moderate assistance   Upper Body Dressing : Moderate assistance   Lower Body Dressing: Moderate assistance   Toilet Transfer: Min guard   Toileting- Clothing Manipulation and Hygiene: Moderate assistance         General ADL Comments: performed dressing and educated pt and wife on protocol, exercises and ice machine. handouts given and both verbalize understanding.  Pt does have a high commode--sink is on R side. He feels he will be able to get up from this     Vision         Perception     Praxis      Pertinent Vitals/Pain        Hand Dominance Right   Extremity/Trunk Assessment Upper Extremity Assessment Upper Extremity Assessment: (able to move R wrist and fingers)           Communication Communication Communication: No difficulties   Cognition Arousal/Alertness: Awake/alert Behavior During Therapy: WFL for tasks assessed/performed Overall Cognitive Status: Within Functional Limits for tasks assessed                                     General Comments       Exercises     Shoulder Instructions      Home Living Family/patient expects to be discharged to:: Private residence Living Arrangements: Spouse/significant other                     Bathroom Toilet: Handicapped height                Prior Functioning/Environment Level of Independence: Independent                 OT Problem List:        OT Treatment/Interventions:      OT Goals(Current goals  can be found in the care plan section) Acute Rehab OT Goals Patient Stated Goal: return to independence OT Goal Formulation: All assessment and education complete, DC therapy  OT Frequency:     Barriers to D/C:            Co-evaluation              AM-PAC OT "6 Clicks" Daily Activity     Outcome Measure Help from another person eating meals?: A Little Help from another person taking care of personal grooming?: A Little Help from another person toileting, which includes using toliet, bedpan, or urinal?: A Lot Help from another person bathing (including washing, rinsing, drying)?: A Lot Help from another person to put on and taking off regular upper body clothing?: A Lot Help from another person to put on and taking off regular lower body clothing?: A Lot 6 Click Score: 14   End of Session Nurse Communication: (ready for d/c)  Activity Tolerance: Patient tolerated treatment well Patient left: in chair;with call bell/phone within reach  OT Visit Diagnosis: Muscle weakness (generalized)  (M62.81)                Time: [id number] OT Time Calculation (min): 38 min Charges:  OT General Charges $OT Visit: 1 Visit OT Evaluation $OT Eval Low Complexity: 1 Low OT Treatments $Self Care/Home Management : 8-22 mins  Elmarie Devlin S, OTR/L Acute Rehabilitation Services 03/23/2019  [place] 03/23/2019, 3:20 PM

## 2019-03-23 NOTE — Op Note (Signed)
03/23/2019  11:44 AM  PATIENT:   Wayne Brandt  68 y.o. male  PRE-OPERATIVE DIAGNOSIS:  Right shoulder rotator cuff tear arthropathy  POST-OPERATIVE DIAGNOSIS: Same  PROCEDURE: Right shoulder reverse arthroplasty utilizing a press-fit size 10 Arthrex stem with a neutral metaphysis, +6 polyethylene insert, 39/+4 glenosphere on a small/+2 baseplate  SURGEON:  Cowan Pilar, Metta Clines M.D.  ASSISTANTS: Jenetta Loges, PA-C  ANESTHESIA:   General endotracheal and interscalene block with Exparel  EBL: 200 cc  SPECIMEN: None  Drains: None   PATIENT DISPOSITION:  PACU - hemodynamically stable.    PLAN OF CARE: Discharge to home after PACU  Brief history:  Mr. Kittner is a 68 year old gentleman who has had chronic and progressively increasing right shoulder pain related to end stage rotator cuff tear arthropathy.  His clinical exam shows a painful and restricted range of motion with global weakness.  Plain radiographs confirm advanced arthritis with a high riding humeral head and changes consistent with rotator cuff tear arthropathy.  He is brought to the operating this time for planned right shoulder reverse arthroplasty.  Preoperatively counseled Mr. Peerson regarding treatment options as well as potential risks versus benefits thereof.  Possible surgical complications were reviewed including bleeding, infection, neurovascular injury, persistent pain, loss of motion, anesthetic complication, failure the implant, and possibly for additional surgery.  He understands, and accepts, and agrees with our planned procedure.  Procedure in detail:  After undergoing routine preop evaluation patient received prophylactic antibiotics and interscalene block was established in the holding area by the anesthesia department.  Patient subsequently placed supine on the operating table and underwent the smooth induction of a general endotracheal anesthesia.  Placed into the beachchair position and  appropriately padded and protected.  The right shoulder girdle region was sterilely prepped and draped in standard fashion.  Timeout was called.  An anterior deltopectoral approach to the right shoulder is made through 10 cm incision.  Skin flaps were elevated dissection carried deeply the deltopectoral interval was developed from proximal to distal.  Of note there were number of large varicosities in the subcutaneous layer which were ligated and the underlying tortuous cephalic vein which did become compromised and so a ligation of the vein distally was necessary.  We then further developed the deltopectoral interval and divided the upper centimeter and a half of the pectoralis major to enhance exposure.  There had been a previous rupture of the long head biceps tendon.  The remnant of the subscapularis was then divided from the lesser tuberosity using electrocautery and the free margin was then tagged with suture tape sutures.  We then divided the capsular attachments from the anterior and infra margins of the humeral neck allowing delivery of the humeral head through the wound.  An oscillating saw was then used to perform our humeral head resection which we identified using our extra medullary guide at approximate 20 degrees retroversion.  Peripheral osteophytes were then removed with a rondure.  A metal cap was then placed over the cut proximal humeral surface.  At this point we exposed the glenoid with appropriate retractors and performed a circumferential labral resection gaining complete visualization of the periphery of the glenoid.  A guidepin was then directed into the center of the glenoid at an approximate 10 degree inferior tilt and the glenoid was then prepared with a central and peripheral reamer to a stable subchondral bony bed and all debris was copes irrigated and cleaned.  The glenoid preparation was then completed with our drill  and tapped and a 30 mm lag screw was fastened to the baseplate and  the baseplate was then inserted with excellent fit and fixation.  The peripheral locking screws were all then placed obtaining good purchase and fixation.  The 39/+4 glenosphere was then impacted and the central locking screw was inserted with excellent fit and fixation.  We then returned our attention to the proximal humerus where the canal was opened hand reaming to size 7 and ultimately broaching to a size 10 and then these neutral reaming guide was then used to prepare the metaphysis.  Trial reduction was then performed showing good soft tissue balance.  At this point the final implant was assembled on the back table and impacted obtaining excellent fit and fixation.  We then performed a series of trial reductions in the +6 polyethylene insert gave [place] the best soft tissue balance with good stability good motion.  Trial was removed and the final +6 polywas then impacted and final reduction was performed again showing excellent soft tissue balance good motion good stability.  The wound was copiously irrigated.  Hemostasis was obtained.  The subscapularis was then repaired back to the collar of our implant after confirming that it had good elasticity using the previously placed suture tape sutures.  The deltopectoral interval was then reapproximated with a series of figure-of-eight #1 Vicryl sutures.  2-0 Vicryl used with subcu layer and intracuticular 3 Monocryl for the skin followed by Dermabond Aquasol dressing right arm was placed in the sling the patient was awakened, extubated, and taken to the recovery room in stable condition.  Jenetta Loges, PA-C was used as an Environmental consultant throughout this case essential for help with positioning of the patient, positioning extremity, tissue manipulation, implantation of the prosthesis, wound closure, and intraoperative decision-making.  Metta Clines Molley Houser MD   Contact # [phone]

## 2019-03-23 NOTE — Anesthesia Postprocedure Evaluation (Signed)
Anesthesia Post Note  Patient: Wayne Brandt  Procedure(s) Performed: REVERSE SHOULDER ARTHROPLASTY (Right Shoulder)     Patient location during evaluation: PACU Anesthesia Type: Regional and General Level of consciousness: awake and alert Pain management: pain level controlled Vital Signs Assessment: post-procedure vital signs reviewed and stable Respiratory status: spontaneous breathing, nonlabored ventilation, respiratory function stable and patient connected to nasal cannula oxygen Cardiovascular status: blood pressure returned to baseline and stable Postop Assessment: no apparent nausea or vomiting Anesthetic complications: no    Last Vitals:  Vitals:   03/23/19 1230 03/23/19 1245  BP: (!) 143/80 138/81  Pulse: (!) 50 (!) 49  Resp: 12 12  Temp: (!) 36.3 C (!) 36.4 C  SpO2: 95% 96%    Last Pain:  Vitals:   03/23/19 1245  TempSrc:   PainSc: 0-No pain                 Kristoffer Bala L Connor Meacham

## 2019-03-23 NOTE — Discharge Instructions (Signed)
° °Kevin M. Supple, M.D., F.A.A.O.S. °Orthopaedic Surgery °Specializing in Arthroscopic and Reconstructive °Surgery of the Shoulder °[phone] °[address] - [place], [place] [address] - Fax [phone] ° ° °POST-OP TOTAL SHOULDER REPLACEMENT INSTRUCTIONS ° °1. Call the office at [phone] to schedule your first post-op appointment 10-14 days from the date of your surgery. ° °2. The bandage over your incision is waterproof. You may begin showering with this dressing on. You may leave this dressing on until first follow up appointment within 2 weeks. We prefer you leave this dressing in place until follow up however after 5-7 days if you are having itching or skin irritation and would like to remove it you may do so. Go slow and tug at the borders gently to break the bond the dressing has with the skin. At this point if there is no drainage it is okay to go without a bandage or you may cover it with a light guaze and tape. You can also expect significant bruising around your shoulder that will drift down your arm and into your chest wall. This is very normal and should resolve over several days. ° ° 3. Wear your sling/immobilizer at all times except to perform the exercises below or to occasionally let your arm dangle by your side to stretch your elbow. You also need to sleep in your sling immobilizer until instructed otherwise. It is ok to remove your sling if you are sitting in a controlled environment and allow your arm to rest in a position of comfort by your side or on your lap with pillows to give your neck and skin a break from the sling. You may remove it to allow arm to dangle by side to shower. If you are up walking around and when you go to sleep at night you need to wear it. ° °4. Range of motion to your elbow, wrist, and hand are encouraged 3-5 times daily. Exercise to your hand and fingers helps to reduce swelling you may experience. ° °5. Utilize ice to the shoulder 3-5 times  minimum a day and additionally if you are experiencing pain. ° °6. Prescriptions for a pain medication and a muscle relaxant are provided for you. It is recommended that if you are experiencing pain that you pain medication alone is not controlling, add the muscle relaxant along with the pain medication which can give additional pain relief. The first 1-2 days is generally the most severe of your pain and then should gradually decrease. As your pain lessens it is recommended that you decrease your use of the pain medications to an "as needed basis'" only and to always comply with the recommended dosages of the pain medications. ° °7. Pain medications can produce constipation along with their use. If you experience this, the use of an over the counter stool softener or laxative daily is recommended.  ° °8. For additional questions or concerns, please do not hesitate to call the office. If after hours there is an answering service to forward your concerns to the physician on call. ° °9.Pain control following an exparel block ° °To help control your post-operative pain you received a nerve block  performed with Exparel which is a long acting anesthetic (numbing agent) which can provide pain relief and sensations of numbness (and relief of pain) in the operative shoulder and arm for up to 3 days. Sometimes it provides mixed relief, meaning you may still have numbness in certain areas of the arm but can still   be able to move  parts of that arm, hand, and fingers. We recommend that your prescribed pain medications  be used as needed. We do not feel it is necessary to "pre medicate" and "stay ahead" of pain.  Taking narcotic pain medications when you are not having any pain can lead to unnecessary and potentially dangerous side effects.    10. Use the ice machine as much as possible in the first 5-7 days from surgery, then you can wean its use to as needed. The ice typically needs to be replaced every 6 hours, instead of  ice you can actually freeze water bottles to put in the cooler and then fill water around them to avoid having to purchase ice. You can have spare water bottles freezing to allow you to rotate them once they have melted. Try to have a thin shirt or light cloth or towel under the ice wrap to protect your skin.   11.  We recommend that you avoid any dental work or cleaning in the first 3 months following your joint replacement. This is to help minimize the possibility of infection from the bacteria in your mouth that enters your bloodstream during dental work. We also recommend that you take an antibiotic prior to your dental work for the first year after your shoulder replacement to further help reduce that risk. Please simply contact our office for antibiotics to be sent to your pharmacy prior to dental work.  POST-OP EXERCISES  Pendulum Exercises  Perform pendulum exercises while standing and bending at the waist. Support your uninvolved arm on a table or chair and allow your operated arm to hang freely. Make sure to do these exercises passively - not using you shoulder muscles. These exercises can be performed once your nerve block effects have worn off.  Repeat 20 times. Do 3 sessions per day.

## 2019-03-23 NOTE — Progress Notes (Signed)
Assisted Dr. Chelsey Woodrum with right, ultrasound guided, interscalene  block. Side rails up, monitors on throughout procedure. See vital signs in flow sheet. Tolerated Procedure well.  

## 2019-03-23 NOTE — H&P (Signed)
Wayne Brandt    Chief Complaint: Right shoulder rotator cuff tear arthropathy HPI: The patient is a 68 y.o. male with end stage right shoulder rotator cuff tear arthropathy  Past Medical History:  Diagnosis Date  . Actinic keratosis    Dr. Renda Rolls  . Arthritis    wrists, L thumb  . BPH (benign prostatic hyperplasia)   . ED (erectile dysfunction)   . Family history of premature CAD   . GERD (gastroesophageal reflux disease)   . HH (hiatus hernia)   . History of basal cell carcinoma excision    2013-- chest area  . History of esophagogastroduodenoscopy (EGD) 12/06/2017   Dr.Medoff-distal esophageal stricture,hiatal hernia,gastritis and duodenitis.  . Hypertension   . Lower urinary tract symptoms (LUTS)   . Mild intermittent asthma   . Peyronie's disease   . Polio    dx 1956  effected left leg  . S/P dilatation of esophageal stricture    multiple times  . SCC (squamous cell carcinoma) 2020   face  . Urinary retention     Past Surgical History:  Procedure Laterality Date  . ANKLE FUSION Left 1997  . BAND HEMORRHOIDECTOMY     Dr. Earlean Shawl  . CARDIOVASCULAR STRESS TEST  2006   normal--Dr. Percival Spanish  . COLONOSCOPY, ESOPHAGOGASTRODUODENOSCOPY (EGD) AND ESOPHAGEAL DILATION  02/15/2014   Dr.Medoff  . ESOPHAGEAL DILATION  02/08/2017   Dr. Earlean Shawl.  EGD showed stricture and HH  . ESOPHAGOGASTRODUODENOSCOPY  02/04/2017  . ESOPHAGOGASTRODUODENOSCOPY ENDOSCOPY  02/08/2017  . I & D RIGHT KNEE W/ REMOVAL PATELLA WIRES  02/09/2007  . KNEE ARTHROSCOPY Right 07/07/2000   post-op  07-09-2000  right knee arthroscopy w/ lavage and debridement hemathrosis/ pyathrosis  . LEFT FOOT SURGERY  x2  as child   polio  . NEUROPLASTY / TRANSPOSITION ULNAR NERVE AT ELBOW Right 01/04/2008  . ORIF RIGHT PERIPROSTHEITC PATELLA FX  11/01/2006  . TOTAL KNEE ARTHROPLASTY Right 02/22/2006  . TRANSURETHRAL RESECTION OF PROSTATE N/A 06/08/2016   Procedure: TRANSURETHRAL RESECTION OF THE PROSTATE  (TURP);  Surgeon: Kathie Rhodes, MD;  Location: [facility];  Service: Urology;  Laterality: N/A;  . UPPER GI ENDOSCOPY  11/21/2018   hiatal hernia, gastritis, duodenitis  . WRIST ARTHROSCOPY WITH DEBRIDEMENT Right 03/30/2001    Family History  Problem Relation Age of Onset  . Alcohol abuse Mother   . Cancer Mother        male cancer in 26's  . Heart disease Father 64       MI  . Heart disease Brother 89       MI  . Glaucoma Brother   . Heart disease Brother 48       stents  . Diabetes Neg Hx   . Colon cancer Neg Hx     Social History:  reports that he has never smoked. He has never used smokeless tobacco. He reports current alcohol use. He reports that he does not use drugs.   Medications Prior to Admission  Medication Sig Dispense Refill  . aspirin EC 325 MG tablet Take 162.5 mg by mouth daily.     . Cholecalciferol (VITAMIN D-3) 1000 units CAPS Take 1,000 Units by mouth daily.     . fish oil-omega-3 fatty acids 1000 MG capsule Take 1 g by mouth daily.     Marland Kitchen losartan (COZAAR) 50 MG tablet Take 1 tablet (50 mg total) by mouth daily. 90 tablet 1  . omeprazole (PRILOSEC) 40 MG capsule Take 40 mg by  mouth daily.    . Pyridoxine HCl (VITAMIN B-6) 500 MG tablet Take 500 mg by mouth daily.      . vitamin B-12 (CYANOCOBALAMIN) 1000 MCG tablet Take 1,000 mcg by mouth daily.    Marland Kitchen albuterol (PROVENTIL HFA;VENTOLIN HFA) 108 (90 BASE) MCG/ACT inhaler Inhale 2 puffs into the lungs every 6 (six) hours as needed for wheezing. Use 30 mins prior to exercise 18 g 1  . meloxicam (MOBIC) 7.5 MG tablet Take 1 tablet (7.5 mg total) by mouth 2 (two) times daily. (Patient not taking: Reported on 03/15/2019) 60 tablet 1  . sildenafil (REVATIO) 20 MG tablet Use 2-5 tablets by mouth once daily as needed for erectile dysfunction 30 tablet 11     Physical Exam: right shoulder demonstrates painful and restricted motion as noted at recent office visits  Vitals  Temp:  [97.9 F (36.6 C)]  97.9 F (36.6 C) (12/10 0753) Pulse Rate:  [55-71] 56 (12/10 0915) Resp:  [7-18] 14 (12/10 0915) BP: (137-146)/(75-84) 140/75 (12/10 0910) SpO2:  [99 %-100 %] 100 % (12/10 0915)  Assessment/Plan  Impression: Right shoulder rotator cuff tear arthropathy  Plan of Action: Procedure(s): REVERSE SHOULDER ARTHROPLASTY  Mindy Gali M [facility] 03/23/2019, 9:32 AM Contact # [phone]

## 2019-03-24 ENCOUNTER — Encounter: Payer: Self-pay | Admitting: *Deleted

## 2019-03-28 ENCOUNTER — Encounter: Payer: Self-pay | Admitting: Family Medicine

## 2019-03-29 ENCOUNTER — Other Ambulatory Visit: Payer: Self-pay

## 2019-03-29 ENCOUNTER — Ambulatory Visit (INDEPENDENT_AMBULATORY_CARE_PROVIDER_SITE_OTHER): Payer: Medicare Other | Admitting: Family Medicine

## 2019-03-29 ENCOUNTER — Encounter: Payer: Self-pay | Admitting: Family Medicine

## 2019-03-29 VITALS — BP 148/76 | Temp 98.0°F | Ht 68.0 in | Wt 164.0 lb

## 2019-03-29 DIAGNOSIS — R509 Fever, unspecified: Secondary | ICD-10-CM

## 2019-03-29 DIAGNOSIS — R05 Cough: Secondary | ICD-10-CM | POA: Diagnosis not present

## 2019-03-29 DIAGNOSIS — R059 Cough, unspecified: Secondary | ICD-10-CM

## 2019-03-29 NOTE — Progress Notes (Signed)
Start time: 3:11 End time: 3:30  Virtual Visit via Video Note  I connected with Wayne Brandt on 03/29/19 at  2:30 PM EST by a video enabled telemedicine application and verified that I am speaking with the correct person using two identifiers.  Location: Patient: home Provider: office   I discussed the limitations of evaluation and management by telemedicine and the availability of in person appointments. The patient expressed understanding and agreed to proceed.  History of Present Illness:  Chief Complaint  Patient presents with  . Cough    VIRTUAL cough and fever that spikes at night. Had shoulder replacement last week and cough started next day (last Friday).   He had shoulder surgery 12/10. Has f/u next Thursday for 2 week f/u He reports that as soon as he went home after surgery, noticed a cough and low-grade fever (99.6). He only develops the low grade temps later in the day (8-9 pm, starts warming up in the late afternoon). Today he feels much better. Usually he starts coughing if he talks a lot, but seems better today. He also is more likely to cough when eating, but this isn't consistent.  He seems to cough a lot when he is hot, in the evenings. Cough is nonproductive.  Gets asthma twice/year (around Christmas, when he visits son), so has albuterol at home, but hasn't tried it.    On omeprazole since August, after endoscopy and dilation.  He is quite happy with the results, in that he hasn't had any dysphagia recur yet.  He denies any heartburn.   PMH, PSH, SH reviewed  Outpatient Encounter Medications as of 03/29/2019  Medication Sig  . aspirin EC 325 MG tablet Take 162.5 mg by mouth daily.   . Cholecalciferol (VITAMIN D-3) 1000 units CAPS Take 1,000 Units by mouth daily.   . fish oil-omega-3 fatty acids 1000 MG capsule Take 1 g by mouth daily.   Marland Kitchen losartan (COZAAR) 50 MG tablet Take 1 tablet (50 mg total) by mouth daily.  . meloxicam (MOBIC) 7.5 MG tablet Take 1  tablet (7.5 mg total) by mouth 2 (two) times daily.  Marland Kitchen omeprazole (PRILOSEC) 40 MG capsule Take 40 mg by mouth daily.  . Pyridoxine HCl (VITAMIN B-6) 500 MG tablet Take 500 mg by mouth daily.    . vitamin B-12 (CYANOCOBALAMIN) 1000 MCG tablet Take 1,000 mcg by mouth daily.  Marland Kitchen albuterol (PROVENTIL HFA;VENTOLIN HFA) 108 (90 BASE) MCG/ACT inhaler Inhale 2 puffs into the lungs every 6 (six) hours as needed for wheezing. Use 30 mins prior to exercise (Patient not taking: Reported on 03/29/2019)  . cyclobenzaprine (FLEXERIL) 10 MG tablet Take 1 tablet (10 mg total) by mouth 3 (three) times daily as needed for muscle spasms. (Patient not taking: Reported on 03/29/2019)  . ondansetron (ZOFRAN) 4 MG tablet Take 1 tablet (4 mg total) by mouth every 8 (eight) hours as needed for nausea or vomiting. (Patient not taking: Reported on 03/29/2019)  . oxyCODONE-acetaminophen (PERCOCET) 5-325 MG tablet Take 1 tablet by mouth every 4 (four) hours as needed (max 6 q). (Patient not taking: Reported on 03/29/2019)  . sildenafil (REVATIO) 20 MG tablet Use 2-5 tablets by mouth once daily as needed for erectile dysfunction (Patient not taking: Reported on 03/29/2019)   No facility-administered encounter medications on file as of 03/29/2019.   He denies needing any pain medications since surgery.  No Known Allergies  ROS:  Low grade fevers in evenings, and nonproductive cough per HPI.  He  denies any sore throat, runny nose, sinus pain, shortness of breath, chest pain.  No urinary complaints. He had nausea the first day of surgery only.  Currently without nausea, vomiting, abdominal pain.  Bowels are normal. No bleeding, bruising, rash.  No swelling in legs. Moods are good.    Observations/Objective:  Temp 98 F (36.7 C) (Oral)   Ht 5\' 8"  (1.727 m)   Wt 164 lb (74.4 kg)   BMI 24.94 kg/m   148/76 Well-appearing, pleasant, talkative male, in good spirits, in no distress.  Occasional cough noted during visit (not  intractable or spasm). He is speaking comfortably. He is alert, oriented, cranial nerves are grossly intact Exam is limited due to virtual nature of the visit.   Assessment and Plan:  Cough  Low grade fever - in evenings only    Discussed Ddx of his symptoms--could be related to atelectasis (encouraged to take frequent deep breaths--previously used incentive spirometer with knee surgery, so aware of proper technique), related to intubation, RAD. Denies COVID exposures, and given onset of symptoms shortly after surgery, is less likely.  Recommended use of throat/cough lozenges Trial of albuterol inhaler when coughing. Deep breathing exercises. F/u if symptoms persist, worsen, especially if any SOB. To stay active to prevent DVT's, continue HEP.   Follow Up Instructions:    I discussed the assessment and treatment plan with the patient. The patient was provided an opportunity to ask questions and all were answered. The patient agreed with the plan and demonstrated an understanding of the instructions.   The patient was advised to call back or seek an in-person evaluation if the symptoms worsen or if the condition fails to improve as anticipated.  I provided 18 minutes of non-face-to-face time during this encounter.   Vikki Ports, MD

## 2019-03-29 NOTE — Patient Instructions (Signed)
Stay well hydrated. Do deep breathing exercises every hour (or more), as we discussed (you can use incentive spirometer if you still have it from prior surgery).  You can try using the albuterol inhaler when your cough acts up.  Let us know if fever goes up to 101 or higher, if your cough becomes productive (coughing up phlegm), if you develop shortness of breath, pain with breathing, swelling in your legs, or any other concerns.

## 2019-03-31 ENCOUNTER — Encounter: Payer: Self-pay | Admitting: Family Medicine

## 2019-03-31 DIAGNOSIS — J45990 Exercise induced bronchospasm: Secondary | ICD-10-CM

## 2019-03-31 DIAGNOSIS — R05 Cough: Secondary | ICD-10-CM

## 2019-03-31 DIAGNOSIS — R059 Cough, unspecified: Secondary | ICD-10-CM

## 2019-03-31 MED ORDER — ALBUTEROL SULFATE HFA 108 (90 BASE) MCG/ACT IN AERS: 2.0000 | INHALATION_SPRAY | Freq: Four times a day (QID) | RESPIRATORY_TRACT | 1 refills | Status: DC | PRN

## 2019-04-03 ENCOUNTER — Telehealth: Payer: Self-pay

## 2019-04-03 MED ORDER — HYDROCODONE-HOMATROPINE 5-1.5 MG/5ML PO SYRP: 5.0000 mL | ORAL_SOLUTION | Freq: Three times a day (TID) | ORAL | 0 refills | Status: DC | PRN

## 2019-04-03 NOTE — Telephone Encounter (Signed)
Advise pt hydrocodone syrup was sent to pharmacy.  He cannot drive while taking this during the day, and not to mix with alcohol.  If he has persistent or worsening fever, or if he has productive cough, he needs further evaluation.  But he can give this syrup a try for the next few days and see if it resolves on its own. Concerns would be persistent fever, worsening cough, shortness of breath, pain with breathing, productive cough.

## 2019-04-03 NOTE — Telephone Encounter (Signed)
Patient notified

## 2019-04-03 NOTE — Telephone Encounter (Signed)
Pt. Called stating he recently saw you for a virtual visit for a cough, pt. Stated cough is worse now not doing any better, he said the only thing that usually helps is hydrocodone cough syrup if you could call that him for him or does he need to pick up a hard copy or he would try something else if you didn't want to call that in for him. Please advise.

## 2019-04-04 ENCOUNTER — Encounter: Payer: Self-pay | Admitting: Family Medicine

## 2019-04-04 MED ORDER — BENZONATATE 200 MG PO CAPS: 200.0000 mg | ORAL_CAPSULE | Freq: Three times a day (TID) | ORAL | 0 refills | Status: DC | PRN

## 2019-04-05 ENCOUNTER — Encounter: Payer: Self-pay | Admitting: Family Medicine

## 2019-04-05 DIAGNOSIS — R059 Cough, unspecified: Secondary | ICD-10-CM

## 2019-04-05 DIAGNOSIS — R05 Cough: Secondary | ICD-10-CM

## 2019-04-05 DIAGNOSIS — Z471 Aftercare following joint replacement surgery: Secondary | ICD-10-CM | POA: Diagnosis not present

## 2019-04-05 DIAGNOSIS — Z96611 Presence of right artificial shoulder joint: Secondary | ICD-10-CM | POA: Diagnosis not present

## 2019-04-19 DIAGNOSIS — M25611 Stiffness of right shoulder, not elsewhere classified: Secondary | ICD-10-CM | POA: Insufficient documentation

## 2019-05-09 ENCOUNTER — Ambulatory Visit: Payer: Medicare Other

## 2019-05-16 ENCOUNTER — Other Ambulatory Visit: Payer: Self-pay | Admitting: Family Medicine

## 2019-05-16 DIAGNOSIS — I10 Essential (primary) hypertension: Secondary | ICD-10-CM

## 2019-05-17 ENCOUNTER — Telehealth: Payer: Self-pay | Admitting: *Deleted

## 2019-05-17 NOTE — Telephone Encounter (Signed)
appt canceled

## 2019-05-17 NOTE — Telephone Encounter (Signed)
He can cancel

## 2019-05-17 NOTE — Telephone Encounter (Signed)
When I was confiming appts for next Wed, he said he thought he remembered you saying that he only needed this appt if his bp's weren't good. He said his bp's are running good and he doesn't think he needs this appt. If you feel like he does he would like to know if he can change to virtual.

## 2019-05-19 ENCOUNTER — Ambulatory Visit: Payer: Medicare Other

## 2019-05-20 ENCOUNTER — Ambulatory Visit: Payer: Medicare Other | Attending: Internal Medicine

## 2019-05-20 DIAGNOSIS — Z23 Encounter for immunization: Secondary | ICD-10-CM

## 2019-05-20 NOTE — Progress Notes (Signed)
   Covid-19 Vaccination Clinic  Name:  Wayne Brandt    MRN: [id number] DOB: 1951/03/31  05/20/2019  [name] was observed post Covid-19 immunization for 15 minutes without incidence. He was provided with Vaccine Information Sheet and instruction to access the V-Safe system.   [name] was instructed to call 911 with any severe reactions post vaccine: Marland Kitchen Difficulty breathing  . Swelling of your face and throat  . A fast heartbeat  . A bad rash all over your body  . Dizziness and weakness    Immunizations Administered    Name Date Dose VIS Date Route   Pfizer COVID-19 Vaccine 05/20/2019  8:16 AM 0.3 mL 03/24/2019 Intramuscular   Manufacturer: [place]   Lot: [id number]   [place]: [id number]

## 2019-05-24 ENCOUNTER — Encounter: Payer: Medicare Other | Admitting: Family Medicine

## 2019-06-13 ENCOUNTER — Ambulatory Visit: Payer: Medicare Other | Attending: Internal Medicine

## 2019-06-13 ENCOUNTER — Ambulatory Visit: Payer: Medicare Other

## 2019-06-13 DIAGNOSIS — Z23 Encounter for immunization: Secondary | ICD-10-CM | POA: Insufficient documentation

## 2019-06-13 NOTE — Progress Notes (Signed)
   Covid-19 Vaccination Clinic  Name:  Wayne Brandt    MRN: [id number] DOB: 05-Mar-1951  06/13/2019  [name] was observed post Covid-19 immunization for 15 minutes without incident. He was provided with Vaccine Information Sheet and instruction to access the V-Safe system.   [name] was instructed to call 911 with any severe reactions post vaccine: Marland Kitchen Difficulty breathing  . Swelling of face and throat  . A fast heartbeat  . A bad rash all over body  . Dizziness and weakness   Immunizations Administered    Name Date Dose VIS Date Route   Pfizer COVID-19 Vaccine 06/13/2019  9:46 AM 0.3 mL 03/24/2019 Intramuscular   Manufacturer: [place]   Lot: [id number]   [place]: [id number]

## 2019-07-17 ENCOUNTER — Encounter: Payer: Self-pay | Admitting: Family Medicine

## 2019-08-14 ENCOUNTER — Other Ambulatory Visit: Payer: Self-pay | Admitting: Family Medicine

## 2019-08-14 DIAGNOSIS — I10 Essential (primary) hypertension: Secondary | ICD-10-CM

## 2019-08-30 ENCOUNTER — Encounter: Payer: Self-pay | Admitting: Family Medicine

## 2019-11-12 ENCOUNTER — Other Ambulatory Visit: Payer: Self-pay | Admitting: Family Medicine

## 2019-11-12 DIAGNOSIS — I10 Essential (primary) hypertension: Secondary | ICD-10-CM

## 2019-11-13 NOTE — Telephone Encounter (Signed)
LVM to find out if pt had enough med to last until his appt 12-04-19 [facility]

## 2019-11-29 NOTE — Progress Notes (Signed)
Chief Complaint  Patient presents with  . Medicare Wellness    fasting AWV/CPE. No new concerns.     Wayne Brandt is a 69 y.o. male who presents for annual physical exam Medicare wellness visit and follow-up on chronic medical conditions.  He has the following concerns:  Hypertension:  He is compliant with losartan 49m once daily.  He denies side effects.   Blood pressures at home are 120's-140/60's-72. 123/63 at home this morning. He denies headaches, dizziness, cough, chest pain, shortness of breath, swelling. Had dizziness and vomiting once when he laid under a car (when working on it), none since.  BPH: s/pTURP2/2018by Dr. OKarsten RoHe gets up 3-4x/night, related to drinking tea during the day, beer in the evenings. This is not bothersome to him, hasn't changed. Peyronie's diseaseand erectileissues. Asymptomatic with respect to Peyronie's. No treatment was recommended.He isnot in a sexual relationship with his wife (due to her health issues, fibromyalgia, etc.). He only follows with him prn (not yearly), not seen in the last few years.  H/o RAD/asthma.Only has problems at Christmas (at his ex-wife's house with pets, and then at his son's house), uses an inhaler with good results. Denies needing inhaler any other time.  Eosinophilic esophagitis: Under the care of Dr. MEarlean Shawl  He had endoscopy and dilatation last year.  He remains on omeprazole. Currently denies any dysphagia or heartburn. He is not using any Flonase or Flovent.  Aortic atherosclerosis: Noted on CXR in 2017. He was recommended to take ASA 84mand statin.  He declined taking statin, but reports he takes baby aspirin "just about every day".  He is interested in coronary calcium screening, due to strong family history of heart disease, and atherosclerosis. He has declined statins in the past, but would be willing if indicated. He had a normal stress test in 2006.  Vitamin Ddeficiency (mild): Level was  low at 27 lin 2016, last check was normal(40.7)in 2019. [place]    Immunization History  Administered Date(s) Administered  . Influenza Whole 01/05/2008  . Influenza, High Dose Seasonal PF 02/06/2016, 06/21/2017, 02/16/2018  . Influenza,inj,Quad PF,6+ Mos 02/01/2013, 05/28/2015  . PFIZER SARS-COV-2 Vaccination 05/20/2019, 06/13/2019  . Pneumococcal Conjugate-13 07/04/2015  . Pneumococcal Polysaccharide-23 06/01/2007, 07/09/2016  . Td 06/20/1997, 07/21/2017  . Tdap 01/05/2008  . Zoster 02/25/2012   Last colonoscopy: 02/2014; pt states told 10 years Last PSA:11/2018, 0.3 Lab Results  Component Value Date   PSA1 0.3 11/14/2018   PSA1 0.3 07/21/2017   PSA 0.85 07/04/2015   PSA 0.73 06/28/2014   PSA 1.43 06/26/2013   Dentist: twice yearly  Ophtho: yearly; has a cataract in the R eye that may need surgery. Vision is fine with a contact for the right. Exercise: daily (elliptical, bike) for 20-30 minutes every day; also playing tennis, golf. Not doing much weights since joint problems, surgery.  Lipids: Lab Results  Component Value Date   CHOL 174 11/14/2018   HDL 76 11/14/2018   LDLCALC 88 11/14/2018   TRIG 50 11/14/2018   CHOLHDL 2.3 11/14/2018   Fall screen: negative Depression screen: negative Functional status survey: unremarkable.  Mini-Cog screen: normal See full screens in epic.  Other doctors caring for patient include: ENT:Dr. BaRedmond Basemanrologist: Dr. OtKarsten Rosees prn) Dentist: Dr. KaDeatra Inaermatologist: Dr. HaRenda Rollsardiologist: Dr. HoPercival SpanishI: Dr. MeEarlean Shawlrtho: Dr. AlWynelle LinkDr. DeMarlou Safor shoulder), Dr. WeBurney GauzeDr. SuOnnie GrahamR shoulder)  He doesn't have Living Will or Healthcare power of attorney.He has been given paperwork  in the past. He plans to do it.   PMH, PSH, SH and FH were reviewed and updated  Outpatient Encounter Medications as of 11/30/2019  Medication Sig  . aspirin 81 MG chewable tablet Chew 81 mg by mouth  daily.  . Cholecalciferol (VITAMIN D-3) 1000 units CAPS Take 1,000 Units by mouth daily.   . fish oil-omega-3 fatty acids 1000 MG capsule Take 1 g by mouth daily.   Marland Kitchen losartan (COZAAR) 50 MG tablet TAKE 1 TABLET(50 MG) BY MOUTH DAILY  . omeprazole (PRILOSEC) 40 MG capsule Take 40 mg by mouth daily.  . Pyridoxine HCl (VITAMIN B-6) 500 MG tablet Take 500 mg by mouth daily.    . vitamin B-12 (CYANOCOBALAMIN) 1000 MCG tablet Take 1,000 mcg by mouth daily.  Marland Kitchen albuterol (VENTOLIN HFA) 108 (90 Base) MCG/ACT inhaler Inhale 2 puffs into the lungs every 6 (six) hours as needed for wheezing. Use 30 mins prior to exercise (Patient not taking: Reported on 11/30/2019)  . HYDROcodone-homatropine (HYCODAN) 5-1.5 MG/5ML syrup Take 5 mLs by mouth every 8 (eight) hours as needed for cough. (Patient not taking: Reported on 11/30/2019)  . meloxicam (MOBIC) 7.5 MG tablet Take 1 tablet (7.5 mg total) by mouth 2 (two) times daily. (Patient not taking: Reported on 11/30/2019)  . sildenafil (REVATIO) 20 MG tablet Use 2-5 tablets by mouth once daily as needed for erectile dysfunction (Patient not taking: Reported on 03/29/2019)  . [DISCONTINUED] aspirin EC 325 MG tablet Take 162.5 mg by mouth daily.   . [DISCONTINUED] benzonatate (TESSALON) 200 MG capsule Take 1 capsule (200 mg total) by mouth 3 (three) times daily as needed.  . [DISCONTINUED] cyclobenzaprine (FLEXERIL) 10 MG tablet Take 1 tablet (10 mg total) by mouth 3 (three) times daily as needed for muscle spasms. (Patient not taking: Reported on 03/29/2019)  . [DISCONTINUED] ondansetron (ZOFRAN) 4 MG tablet Take 1 tablet (4 mg total) by mouth every 8 (eight) hours as needed for nausea or vomiting. (Patient not taking: Reported on 03/29/2019)  . [DISCONTINUED] oxyCODONE-acetaminophen (PERCOCET) 5-325 MG tablet Take 1 tablet by mouth every 4 (four) hours as needed (max 6 q). (Patient not taking: Reported on 03/29/2019)   No facility-administered encounter medications on file  as of 11/30/2019.   No Known Allergies  ROS: The patient denies anorexia, fever, headaches, vision loss, hoarseness, chest pain, palpitations, dizziness, syncope, dyspnea on exertion, swelling, nausea, vomiting, diarrhea, constipation, abdominal pain, melena, hematochezia, hematuria, dysuria, weakness, tremor, suspicious skin lesions (sees derm, Dr.Haverstockregularly), depression, anxiety, abnormal bleeding/bruising, or enlarged lymph nodes  H/o microscopic hematuria--no visible blood per pt  No rectal bleeding since getting hemorrhoidal banding  Occasional tingling R hand from ulnar nerve issue (chronic)--intermittent, overall doing better.Taking B6 has helped a lot. Flaring a little recently, usually worse in the winter. Occasional L thumb pain, worse with golfing(known arthritis), occasional bilateral wrist pain(can no longer do pushups); Right shoulder pain resolved (had surgery, then overused it playing tennis, now better)  Up 3-4x/night to void. Denies hesitancy, weakened stream. Denies dysphagia, heartburn. Occasional ringing in the left ear, tolerable. Sees dermatology at least once a year.  Easy bruising on arms, no bleeding.    PHYSICAL EXAM:  BP (!) 160/70   Pulse (!) 56   Ht 5' 8"  (1.727 m)   Wt 164 lb (74.4 kg)   BMI 24.94 kg/m   Wt Readings from Last 3 Encounters:  11/30/19 164 lb (74.4 kg)  03/29/19 164 lb (74.4 kg)  11/17/18 172 lb 9.6 oz (78.3  kg)   156/70 on repeat by MD  General Appearance:  Alert, cooperative, no distress, appears stated age   Head:  Normocephalic, atraumatic  Eyes:  PERRL, conjunctiva/corneas clear, EOM's intact, fundi benign   Ears:  TM's and EACs are normal  Nose:  Not examined (wearing mask due to COVID-19 pandemic)  Throat:  Not examined (wearing mask due to COVID-19 pandemic)   Neck:  Supple, no lymphadenopathy; thyroid: no enlargement/tenderness/ nodules; no carotidbruit or JVD   Back:  Spine  nontender, no curvature, ROM normal, no CVA tenderness   Lungs:  Clear to auscultation bilaterally without wheezes, rales or ronchi; respirations unlabored   Chest Wall:  No tenderness or deformity   Heart:  Regular rate and rhythm, S1 and S2 normal, 2/6 SEM at RUSB, no rub or gallop   Breast Exam:  No chest wall tenderness, masses or gynecomastia   Abdomen:  Soft, non-tender, nondistended, normoactive bowel sounds, no masses, no hepatosplenomegaly   Genitalia:  Circumcised penis, no rash, lesions, discharge. Testicles are normal, no masses.  No hernias.  Rectal:  Normal sphincter tone, no mass.  Heme negative stool.  Prostate is smooth, not enlarged, no nodules.   Extremities:  No clubbing, cyanosis or edema. +leg length discrepancy--left is shorter than right leg (since L ankle fusion per pt). L ankle fused;atrophy of LLE;WHSS R knee, swollen, warm, no erythema, nontender(chronic, unchanged from prior exams). Thickening and onycholytic changes to many nails. Subungual hematoma on R great toe. Bulge (popeye deformity) noted in RUE with contraction of biceps muscle  Pulses:  2+ and symmetric all extremities   Skin:  Skin color, texture, turgor normal, no rashes. Actinic damage throughout. Many AK's L forearm, hand, some on R hand also Bruising on hands/forearms, purpura, thin skin  Lymph nodes:  Cervical, supraclavicular, and axillary nodes normal   Neurologic:  Normal strength, sensation and gait; reflexes 2+ and symmetric throughout   Psych: Normal mood, affect, hygiene and grooming   ASSESSMENT/PLAN:  Routine general medical examination at a health care facility - Plan: CBC with Differential/Platelet, Comprehensive metabolic panel, Lipid panel, PSA  Medicare annual wellness visit, initial  Senile purpura ([place]) - contributed by ASA-risks/benefits reviewed, to cont ASA - Plan: CBC with Differential/Platelet  Aortic  atherosclerosis ([place]) - on ASA, not statin.  Willing to take statin if recommended if Ca score indicates need - Plan: Lipid panel, CT HEART W/O CONTRAST MEDIA - QUANT EVAL CORONARY CALCIUM  Essential hypertension - elevated in office, normal at home. To monitor at home, and recheck accuracy of monitor. cont losartan - Plan: Comprehensive metabolic panel, losartan (COZAAR) 50 MG tablet  Special screening for malignant neoplasm of prostate - Plan: PSA  Family history of heart disease in male family member before age 49 - pt requesting calcium scan - Plan: Lipid panel, CT HEART W/O CONTRAST MEDIA - QUANT EVAL CORONARY CALCIUM  Vitamin D deficiency - continue daily supplement  Refer for coronary CT/calcium score  c-met, cbc, PSA, lipid (pt wants)  Recommended at least 30 minutes of aerobic activity at least 5 days/week, weight-bearing exercise at least 2x/wk; proper sunscreen use reviewed; healthy diet and alcohol recommendations (less than or equal to 2 drinks/day) reviewed; regular seatbelt use; changing batteries in smoke detectors. Immunization recommendations discussed--continue high dose flu shots yearly. Shingrix recommended, to get from pharmacy, risks/SE reviewed. COVID booster recommended at 8 months per new recommendation. Colonoscopy recommendations reviewed--UTD.   Most form completed, Full Code, Full care Patient reminded to do healthcare  power of attorney and living will, and get [place] copies once notarized/completed.  F/u 1 year, sooner if BP's elevated   Medicare Attestation I have personally reviewed: The patient's medical and social history Their use of alcohol, tobacco or illicit drugs Their current medications and supplements The patient's functional ability including ADLs,fall risks, home safety risks, cognitive, and hearing and visual impairment Diet and physical activities Evidence for depression or mood disorders  The patient's weight, height, BMI have been  recorded in the chart.  I have made referrals, counseling, and provided education to the patient based on review of the above and I have provided the patient with a written personalized care plan for preventive services.

## 2019-11-30 ENCOUNTER — Other Ambulatory Visit: Payer: Self-pay

## 2019-11-30 ENCOUNTER — Ambulatory Visit (INDEPENDENT_AMBULATORY_CARE_PROVIDER_SITE_OTHER): Payer: Medicare Other | Admitting: Family Medicine

## 2019-11-30 ENCOUNTER — Encounter: Payer: Self-pay | Admitting: Family Medicine

## 2019-11-30 VITALS — BP 160/70 | HR 56 | Ht 68.0 in | Wt 164.0 lb

## 2019-11-30 DIAGNOSIS — Z125 Encounter for screening for malignant neoplasm of prostate: Secondary | ICD-10-CM | POA: Diagnosis not present

## 2019-11-30 DIAGNOSIS — E559 Vitamin D deficiency, unspecified: Secondary | ICD-10-CM

## 2019-11-30 DIAGNOSIS — I700 Atherosclerosis of aorta: Secondary | ICD-10-CM

## 2019-11-30 DIAGNOSIS — D692 Other nonthrombocytopenic purpura: Secondary | ICD-10-CM

## 2019-11-30 DIAGNOSIS — I10 Essential (primary) hypertension: Secondary | ICD-10-CM | POA: Diagnosis not present

## 2019-11-30 DIAGNOSIS — Z0000 Encounter for general adult medical examination without abnormal findings: Secondary | ICD-10-CM

## 2019-11-30 DIAGNOSIS — Z8249 Family history of ischemic heart disease and other diseases of the circulatory system: Secondary | ICD-10-CM

## 2019-11-30 MED ORDER — LOSARTAN POTASSIUM 50 MG PO TABS: ORAL_TABLET | ORAL | 1 refills | Status: DC

## 2019-11-30 NOTE — Patient Instructions (Addendum)
  HEALTH MAINTENANCE RECOMMENDATIONS:  It is recommended that you get at least 30 minutes of aerobic exercise at least 5 days/week (for weight loss, you may need as much as 60-90 minutes). This can be any activity that gets your heart rate up. This can be divided in 10-15 minute intervals if needed, but try and build up your endurance at least once a week.  Weight bearing exercise is also recommended twice weekly.  Eat a healthy diet with lots of vegetables, fruits and fiber.  "Colorful" foods have a lot of vitamins (ie green vegetables, tomatoes, red peppers, etc).  Limit sweet tea, regular sodas and alcoholic beverages, all of which has a lot of calories and sugar.  Up to 2 alcoholic drinks daily may be beneficial for men (unless trying to lose weight, watch sugars).  Drink a lot of water.  Sunscreen of at least SPF 30 should be used on all sun-exposed parts of the skin when outside between the hours of 10 am and 4 pm (not just when at beach or pool, but even with exercise, golf, tennis, and yard work!)  Use a sunscreen that says "broad spectrum" so it covers both UVA and UVB rays, and make sure to reapply every 1-2 hours.  Remember to change the batteries in your smoke detectors when changing your clock times in the spring and fall.  Use your seat belt every time you are in a car, and please drive safely and not be distracted with cell phones and texting while driving.    Wayne Brandt , Thank you for taking time to come for your Medicare Wellness Visit. I appreciate your ongoing commitment to your health goals. Please review the following plan we discussed and let me know if I can assist you in the future.   This is a list of the screening recommended for you and due dates:  Health Maintenance  Topic Date Due  . Flu Shot  11/12/2019  . Colon Cancer Screening  02/16/2024  . Tetanus Vaccine  07/22/2027  . COVID-19 Vaccine  Completed  .  Hepatitis C: One time screening is recommended by  Center for Disease Control  (CDC) for  adults born from [birth year] through 1966.   Completed  . Pneumonia vaccines  Completed   Get high dose flu shot in September/October (ignore the date above).  I recommend getting the new shingles vaccine (Shingrix). You will need to get this from the pharmacy (due to being on Medicare, usually covered by Part D).  It is a series of 2 injections, spaced 2 months apart.  COVID booster is recommended for 02/2020 (8 months from the last dose, also [place]).  We will be referring you for a coronary calcium score (CT scan).  Your blood pressure was elevated today.  Continue to monitor BP at home, send [place] a list in a few weeks.  If persistently elevated (over 140) then med changes should be made. Periodically check it elsewhere or consider having your monitor rechecked in the future.

## 2019-12-01 ENCOUNTER — Encounter: Payer: Self-pay | Admitting: Internal Medicine

## 2019-12-01 LAB — COMPREHENSIVE METABOLIC PANEL
ALT: 20 IU/L (ref 0–44)
AST: 35 IU/L (ref 0–40)
Albumin/Globulin Ratio: 1.5 (ref 1.2–2.2)
Albumin: 4.5 g/dL (ref 3.8–4.8)
Alkaline Phosphatase: 116 IU/L (ref 48–121)
BUN/Creatinine Ratio: 13 (ref 10–24)
BUN: 11 mg/dL (ref 8–27)
Bilirubin Total: 0.4 mg/dL (ref 0.0–1.2)
CO2: 27 mmol/L (ref 20–29)
Calcium: 9.4 mg/dL (ref 8.6–10.2)
Chloride: 93 mmol/L — ABNORMAL LOW (ref 96–106)
Creatinine, Ser: 0.86 mg/dL (ref 0.76–1.27)
GFR calc Af Amer: 102 mL/min/{1.73_m2} (ref >59)
GFR calc non Af Amer: 88 mL/min/{1.73_m2} (ref >59)
Globulin, Total: 3 g/dL (ref 1.5–4.5)
Glucose: 95 mg/dL (ref 65–99)
Potassium: 5.2 mmol/L (ref 3.5–5.2)
Sodium: 130 mmol/L — ABNORMAL LOW (ref 134–144)
Total Protein: 7.5 g/dL (ref 6.0–8.5)

## 2019-12-01 LAB — CBC WITH DIFFERENTIAL/PLATELET
Basophils Absolute: 0.1 10*3/uL (ref 0.0–0.2)
Basos: 1 %
EOS (ABSOLUTE): 0.3 10*3/uL (ref 0.0–0.4)
Eos: 4 %
Hematocrit: 36.7 % — ABNORMAL LOW (ref 37.5–51.0)
Hemoglobin: 12.7 g/dL — ABNORMAL LOW (ref 13.0–17.7)
Immature Grans (Abs): 0 10*3/uL (ref 0.0–0.1)
Immature Granulocytes: 0 %
Lymphocytes Absolute: 1.6 10*3/uL (ref 0.7–3.1)
Lymphs: 23 %
MCH: 31.8 pg (ref 26.6–33.0)
MCHC: 34.6 g/dL (ref 31.5–35.7)
MCV: 92 fL (ref 79–97)
Monocytes Absolute: 0.8 10*3/uL (ref 0.1–0.9)
Monocytes: 11 %
Neutrophils Absolute: 4.2 10*3/uL (ref 1.4–7.0)
Neutrophils: 61 %
Platelets: 236 10*3/uL (ref 150–450)
RBC: 3.99 x10E6/uL — ABNORMAL LOW (ref 4.14–5.80)
RDW: 13.1 % (ref 11.6–15.4)
WBC: 6.9 10*3/uL (ref 3.4–10.8)

## 2019-12-01 LAB — LIPID PANEL
Chol/HDL Ratio: 2.2 ratio (ref 0.0–5.0)
Cholesterol, Total: 181 mg/dL (ref 100–199)
HDL: 82 mg/dL (ref >39)
LDL Chol Calc (NIH): 89 mg/dL (ref 0–99)
Triglycerides: 52 mg/dL (ref 0–149)
VLDL Cholesterol Cal: 10 mg/dL (ref 5–40)

## 2019-12-01 LAB — PSA: Prostate Specific Ag, Serum: 0.3 ng/mL (ref 0.0–4.0)

## 2019-12-01 NOTE — Addendum Note (Signed)
Addended by: Minette Headland A on: 12/01/2019 10:42 AM   Modules accepted: Orders

## 2019-12-01 NOTE — Progress Notes (Signed)
Ordered and scheduled test with [facility]. Pt is aware

## 2019-12-04 ENCOUNTER — Ambulatory Visit: Payer: Medicare Other | Admitting: Family Medicine

## 2019-12-07 ENCOUNTER — Telehealth: Payer: Self-pay | Admitting: Cardiology

## 2019-12-07 NOTE — Telephone Encounter (Signed)
New Message:    Pt said his primary doctor scheduled him for CT Calcium Scoring Test. He wants to know if Dr Percival Spanish thinks he need this test?

## 2019-12-07 NOTE — Telephone Encounter (Signed)
Spoke to patient, last seen 2006 by Dr. Percival Spanish.  He states his PCP ordered a CT calcium score test due to his family history and was wondering if he needed to see Dr. Percival Spanish again prior to this.  He states he works out all the time and his cholesterol is good but wanted to be cautious with his family hx.    Advised to follow PCP recommendations and based on results, could see Dr. Percival Spanish back if needed.  Patient aware.

## 2019-12-14 ENCOUNTER — Ambulatory Visit
Admission: RE | Admit: 2019-12-14 | Discharge: 2019-12-14 | Disposition: A | Discharge status: Home / Self Care | Payer: Medicare Other | Source: Ambulatory Visit | Attending: Family Medicine | Admitting: Family Medicine

## 2019-12-14 DIAGNOSIS — Z8249 Family history of ischemic heart disease and other diseases of the circulatory system: Secondary | ICD-10-CM

## 2019-12-14 DIAGNOSIS — I700 Atherosclerosis of aorta: Secondary | ICD-10-CM

## 2019-12-20 ENCOUNTER — Institutional Professional Consult (permissible substitution): Payer: Medicare Other | Admitting: Family Medicine

## 2019-12-20 DIAGNOSIS — I77810 Thoracic aortic ectasia: Secondary | ICD-10-CM | POA: Insufficient documentation

## 2019-12-20 NOTE — Progress Notes (Signed)
Chief Complaint  Patient presents with  . Results    discuss CT results.    Patient presents to discuss his CT results.  Per discussion at his recent physical: Patient has Aortic atherosclerosis. This was noted on CXR in 2017. He was recommended to take ASA 81mg  and statin.  He declined taking statin, but reports he takes baby aspirin "just about every day". He wanted coronary calcium screening, due to strong family history of heart disease, and atherosclerosis. He has declined statins in the past, but would be willing if indicated.  He had CT 12/14/2019 which showed: IMPRESSION: 1. Coronary artery calcium score of 73. This places the patient in the 38th percentile for subjects of the same age, gender and race/ethnicity. 2. Mild dilation of the ascending thoracic aorta, which measures up to 3.5 cm. Recommend annual imaging followup by CTA or MRA. This recommendation follows 2010 ACCF/AHA/AATS/ACR/ASA/SCA/SCAI/SIR/STS/SVM Guidelines for the Diagnosis and Management of Patients with Thoracic Aortic Disease. Circulation.2010; 121: W237-S283. Aortic aneurysm NOS (ICD10-I71.9) 3. Small perifissural nodules in the RIGHT chest largest measuring 6 x 4 mm. No routine follow-up imaging is recommended for these intrapulmonary lymph nodes.  Aortic Atherosclerosis (ICD10-I70.0).  Blood pressures have been normal at home. Last lipids: Lab Results  Component Value Date   CHOL 181 11/30/2019   HDL 82 11/30/2019   LDLCALC 89 11/30/2019   TRIG 52 11/30/2019   CHOLHDL 2.2 11/30/2019   PMH, PSH, SH reviewed  Outpatient Encounter Medications as of 12/21/2019  Medication Sig Note  . aspirin 81 MG chewable tablet Chew 81 mg by mouth daily.   . Cholecalciferol (VITAMIN D-3) 1000 units CAPS Take 1,000 Units by mouth daily.    . fish oil-omega-3 fatty acids 1000 MG capsule Take 1 g by mouth daily.    Marland Kitchen ibuprofen (ADVIL) 200 MG tablet Take 400 mg by mouth as needed. 12/21/2019: Twice weekly  .  losartan (COZAAR) 50 MG tablet TAKE 1 TABLET(50 MG) BY MOUTH DAILY   . omeprazole (PRILOSEC) 40 MG capsule Take 40 mg by mouth daily.   . Pyridoxine HCl (VITAMIN B-6) 500 MG tablet Take 500 mg by mouth daily.     . sildenafil (REVATIO) 20 MG tablet Use 2-5 tablets by mouth once daily as needed for erectile dysfunction   . vitamin B-12 (CYANOCOBALAMIN) 1000 MCG tablet Take 1,000 mcg by mouth daily.   Marland Kitchen albuterol (VENTOLIN HFA) 108 (90 Base) MCG/ACT inhaler Inhale 2 puffs into the lungs every 6 (six) hours as needed for wheezing. Use 30 mins prior to exercise (Patient not taking: Reported on 11/30/2019)   . [DISCONTINUED] HYDROcodone-homatropine (HYCODAN) 5-1.5 MG/5ML syrup Take 5 mLs by mouth every 8 (eight) hours as needed for cough. (Patient not taking: Reported on 11/30/2019)   . [DISCONTINUED] meloxicam (MOBIC) 7.5 MG tablet Take 1 tablet (7.5 mg total) by mouth 2 (two) times daily. (Patient not taking: Reported on 11/30/2019)    No facility-administered encounter medications on file as of 12/21/2019.   No Known Allergies  ROS: no fever, chills, URI symptoms, headaches, dizziness, chest pain, shortness of breath, GI issues.  Moods are good. He has some baseline joint pains prior to starting any statin--R wrist, L thumb, sometimes R elbow.  Some R knee pain. Some stiffness in the neck, r/b Tens unit. Not much pain currently, but these are the areas that periodically hurt.   PHYSICAL EXAM:  BP 130/68   Pulse 60   Ht 5\' 8"  (1.727 m)   Wt 165 lb (  74.8 kg)   BMI 25.09 kg/m   Pleasant male, in good spirits, in no distress Exam limited to discussion of CT results.  Calcium score of 73 discussed--some risk (mild; higher risk if score >100). Mild dilatation of ascending thoracix aorta, 3.5 cm.  Repeat imaging due 1 year (CTA or MRA)  ASSESSMENT/PLAN:  Aortic atherosclerosis ([place]) - pt to start rosuvastatin 5mg  daily.  To contact us if having SE's, to modify schedule. Already taking CoQ10. -  Plan: rosuvastatin (CRESTOR) 5 MG tablet, Lipid panel, Hepatic function panel  Dilatation of thoracic aorta ([facility]) - 3.5cm.  Re-image next year  Need for influenza vaccination - Plan: Flu Vaccine QUAD High Dose(Fluad)  Medication monitoring encounter - Plan: Lipid panel, Hepatic function panel   Start statin (risks/SE reviewed, discussed alternate dosing if SE's) BP is controlled Continue ASA daily   Take the rosuvastatin (Crestor) 5mg  once daily. It doesn't matter what time you take it, just take it consistently.  If you have any side effects you may choose to take it in the evening. Continue to take the aspirin every day.  We will need to repeat an imaging study in 1 year to look at the slight widening of the thoracic aorta.  Your blood pressure monitor is accurate, and your blood pressure was good today.

## 2019-12-21 ENCOUNTER — Encounter: Payer: Self-pay | Admitting: Family Medicine

## 2019-12-21 ENCOUNTER — Ambulatory Visit (INDEPENDENT_AMBULATORY_CARE_PROVIDER_SITE_OTHER): Payer: Medicare Other | Admitting: Family Medicine

## 2019-12-21 ENCOUNTER — Other Ambulatory Visit: Payer: Self-pay

## 2019-12-21 VITALS — BP 130/68 | HR 60 | Ht 68.0 in | Wt 165.0 lb

## 2019-12-21 DIAGNOSIS — I700 Atherosclerosis of aorta: Secondary | ICD-10-CM | POA: Diagnosis not present

## 2019-12-21 DIAGNOSIS — Z23 Encounter for immunization: Secondary | ICD-10-CM | POA: Diagnosis not present

## 2019-12-21 DIAGNOSIS — Z5181 Encounter for therapeutic drug level monitoring: Secondary | ICD-10-CM | POA: Diagnosis not present

## 2019-12-21 DIAGNOSIS — I77810 Thoracic aortic ectasia: Secondary | ICD-10-CM

## 2019-12-21 MED ORDER — ROSUVASTATIN CALCIUM 5 MG PO TABS: 5.0000 mg | ORAL_TABLET | Freq: Every day | ORAL | 2 refills | Status: DC

## 2019-12-21 NOTE — Patient Instructions (Signed)
Take the rosuvastatin (Crestor) 5mg  once daily. It doesn't matter what time you take it, just take it consistently.  If you have any side effects you may choose to take it in the evening. Continue to take the aspirin every day.  We will need to repeat an imaging study in 1 year to look at the slight widening of the thoracic aorta.  Your blood pressure monitor is accurate, and your blood pressure was good today.

## 2020-02-19 ENCOUNTER — Other Ambulatory Visit: Payer: Medicare Other

## 2020-02-19 ENCOUNTER — Other Ambulatory Visit: Payer: Self-pay

## 2020-02-19 DIAGNOSIS — Z5181 Encounter for therapeutic drug level monitoring: Secondary | ICD-10-CM

## 2020-02-19 DIAGNOSIS — I700 Atherosclerosis of aorta: Secondary | ICD-10-CM

## 2020-02-20 ENCOUNTER — Encounter: Payer: Self-pay | Admitting: Family Medicine

## 2020-02-20 LAB — HEPATIC FUNCTION PANEL
ALT: 21 IU/L (ref 0–44)
AST: 35 IU/L (ref 0–40)
Albumin: 4.5 g/dL (ref 3.8–4.8)
Alkaline Phosphatase: 110 IU/L (ref 44–121)
Bilirubin Total: 0.3 mg/dL (ref 0.0–1.2)
Bilirubin, Direct: 0.12 mg/dL (ref 0.00–0.40)
Total Protein: 7.1 g/dL (ref 6.0–8.5)

## 2020-02-20 LAB — LIPID PANEL
Chol/HDL Ratio: 1.9 ratio (ref 0.0–5.0)
Cholesterol, Total: 161 mg/dL (ref 100–199)
HDL: 83 mg/dL (ref >39)
LDL Chol Calc (NIH): 68 mg/dL (ref 0–99)
Triglycerides: 49 mg/dL (ref 0–149)
VLDL Cholesterol Cal: 10 mg/dL (ref 5–40)

## 2020-02-28 ENCOUNTER — Other Ambulatory Visit: Payer: Self-pay | Admitting: Family Medicine

## 2020-02-28 DIAGNOSIS — N529 Male erectile dysfunction, unspecified: Secondary | ICD-10-CM

## 2020-02-28 NOTE — Telephone Encounter (Signed)
Is this okay to refill? 

## 2020-02-29 ENCOUNTER — Encounter: Payer: Self-pay | Admitting: Family Medicine

## 2020-02-29 ENCOUNTER — Ambulatory Visit (INDEPENDENT_AMBULATORY_CARE_PROVIDER_SITE_OTHER): Payer: Medicare Other | Admitting: Family Medicine

## 2020-02-29 ENCOUNTER — Other Ambulatory Visit: Payer: Self-pay

## 2020-02-29 ENCOUNTER — Other Ambulatory Visit: Payer: Self-pay | Admitting: Family Medicine

## 2020-02-29 VITALS — BP 138/68 | HR 60 | Ht 68.0 in | Wt 166.8 lb

## 2020-02-29 DIAGNOSIS — I10 Essential (primary) hypertension: Secondary | ICD-10-CM

## 2020-02-29 DIAGNOSIS — Z23 Encounter for immunization: Secondary | ICD-10-CM | POA: Diagnosis not present

## 2020-02-29 MED ORDER — HYDROCHLOROTHIAZIDE 12.5 MG PO CAPS: 12.5000 mg | ORAL_CAPSULE | Freq: Every day | ORAL | 0 refills | Status: DC

## 2020-02-29 NOTE — Progress Notes (Signed)
Chief Complaint  Patient presents with  . Hypertension    has been having elevated bp readings.    BP's had been good up until the end of October and through early November. BP's from 11/4-11/9 ranged 128-167/66-71 in the mornings, and 144-160/75-82 in the evenings.  Since 11/10, they have been lower in the mornings, running 128-150/59-71, mostly in the upper 120s to lower 130's/60-71.  Evening still running a little higher, 138-160/74-85, all but one were over 440 systolic.  His main concern is being worried about having a stroke.  Denies significant stressors--just some minor ones, airplane having some mechanical problems. Diagnosed with a skin cancer below his right eye (after the higher BP's noted). Switched to unsalted peanuts to snack on at night. Not eating much soup.  Careful what he eats when out ([employer] frequently--brocolli, yams, greens).  Ate at [employer] with his granddaughter once. Eats bean burrito at [employer] on Sundays for lunch (1000mg  of sodium--we looked it up).   Still getting daily exercise, no chest pain, shortness of breath. No headaches, dizziness.  Urinates very frequently at baseline.  When he cut back from 2 to 1 beers in the evening, he only needs to get up once to void, so sleep is much better.  SCC under right eye, scheduled with skin cancer center for consult regarding treatment.  PMH, PSH, SH reviewed  Outpatient Encounter Medications as of 02/29/2020  Medication Sig Note  . aspirin 81 MG chewable tablet Chew 81 mg by mouth daily.   . Cholecalciferol (VITAMIN D-3) 1000 units CAPS Take 1,000 Units by mouth daily.    . fish oil-omega-3 fatty acids 1000 MG capsule Take 1 g by mouth daily.    Marland Kitchen ibuprofen (ADVIL) 200 MG tablet Take 400 mg by mouth as needed. 12/21/2019: Twice weekly  . losartan (COZAAR) 50 MG tablet TAKE 1 TABLET(50 MG) BY MOUTH DAILY   . omeprazole (PRILOSEC) 40 MG capsule Take 40 mg by mouth daily.   . Pyridoxine HCl (VITAMIN B-6) 500 MG  tablet Take 500 mg by mouth daily.     . rosuvastatin (CRESTOR) 5 MG tablet Take 1 tablet (5 mg total) by mouth daily.   . vitamin B-12 (CYANOCOBALAMIN) 1000 MCG tablet Take 1,000 mcg by mouth daily.   Marland Kitchen albuterol (VENTOLIN HFA) 108 (90 Base) MCG/ACT inhaler Inhale 2 puffs into the lungs every 6 (six) hours as needed for wheezing. Use 30 mins prior to exercise (Patient not taking: Reported on 11/30/2019)   . sildenafil (REVATIO) 20 MG tablet TAKE 2-5 TABLETS BY MOUTH ONCE DAILY AS NEEDED FOR ERECTILE DYSFUNCTION (Patient not taking: Reported on 02/29/2020)    No facility-administered encounter medications on file as of 02/29/2020.   No Known Allergies  ROS:  No headaches, dizziness, neurologic symptoms, URI symptoms, fever, cough, shortness of breath, chest pain, GI complaints, muscle cramps.  Skin cancer on face, no bruising, rashes or other skin concerns.  Moods are good--just worried about his blood pressure and about having a stroke.   PHYSICAL EXAM:  BP (!) 160/80   Pulse 60   Ht 5\' 8"  (1.727 m)   Wt 166 lb 12.8 oz (75.7 kg)   BMI 25.36 kg/m   138/68 on repeat by MD  Pleasant, somewhat excitable/anxious male, in no distress, overall in good spirits HEENT: conjunctiva and sclera are clear.  Very slight scar from recent biopsy at R lateral inferior eye.  Wearing mask Neck: no lymphadenopathy, thyromegaly or mass Heart: regular rate and rhythm  Lungs: clear bilaterally Extremities: no edema Skin: normal turgor, no rashes. Psych: slightly anxious/excitable, normal mood, hygiene and grooming, normal speech, eye contact.   ASSESSMENT/PLAN:  Essential hypertension - Na content of food is much higher than he realized. Reviewed diet.  If BP's don't improve in a week, start HCTZ 12.5mg . SE reviewed.  labs 2-3 wks after start - Plan: hydrochlorothiazide (MICROZIDE) 12.5 MG capsule  Need for viral immunization - Plan: Pfizer SARS-COV-2 Vaccine  Significant counseling provided regarding  what causes higher blood pressures, sodium content in food, and risks/SE and monitoring regarding HCTZ.  I spent 35 minutes dedicated to the care of this patient, including pre-visit review of records, face to face time, post-visit ordering of testing and documentation.  We discussed the sodium content in your food--it is higher than you realized. Goal is under 2000mg  daily. See info provided. When going to places to eat, research ahead of time to find lower sodium options to eat. Best is cooking at home, where you have control over the content. Continue regular exercise, and monitoring blood pressure.  We sent a prescription in for HCTZ 12.5mg  once daily, to be taken in the morning. Don't start this right away--see what your blood pressure does over the next week with just really watching the sodium.  If it remains over 517 systolic, then go ahead and start the HCTZ. This should help keep the blood pressure a little lower. Be sure to stay well hydrated. We want you to return in 2-4 weeks after starting the HCTZ (if started) for nonfasting labs to check your electrolytes. If you can't tolerate the medication due to side effects, you can cancel the lab visit, and just work harder on limiting the sodium in the diet, and continue to monitor your blood pressure.  Send [place] your values in the next few weeks, or with any questions or concerns.

## 2020-02-29 NOTE — Patient Instructions (Addendum)
We discussed the sodium content in your food--it is higher than you realized. Goal is under 2000mg  daily. See info provided. When going to places to eat, research ahead of time to find lower sodium options to eat. Best is cooking at home, where you have control over the content. Continue regular exercise, and monitoring blood pressure.  We are going to add HCTZ 12.5mg  once daily, to be taken in the morning. This should help keep the blood pressure a little lower. We want you to return in 2-4 weeks for nonfasting labs to check your electrolytes. If you can't tolerate the medication due to side effects, you can cancel the lab visit, and just work harder on limiting the sodium in the diet, and continue to monitor your blood pressure.     Low-Sodium Eating Plan Sodium, which is an element that makes up salt, helps you maintain a healthy balance of fluids in your body. Too much sodium can increase your blood pressure and cause fluid and waste to be held in your body. Your health care provider or dietitian may recommend following this plan if you have high blood pressure (hypertension), kidney disease, liver disease, or heart failure. Eating less sodium can help lower your blood pressure, reduce swelling, and protect your heart, liver, and kidneys. What are tips for following this plan? General guidelines  Most people on this plan should limit their sodium intake to 1,500-2,000 mg (milligrams) of sodium each day. Reading food labels   The Nutrition Facts label lists the amount of sodium in one serving of the food. If you eat more than one serving, you must multiply the listed amount of sodium by the number of servings.  Choose foods with less than 140 mg of sodium per serving.  Avoid foods with 300 mg of sodium or more per serving. Shopping  Look for lower-sodium products, often labeled as "low-sodium" or "no salt added."  Always check the sodium content even if foods are labeled as  "unsalted" or "no salt added".  Buy fresh foods. ? Avoid canned foods and premade or frozen meals. ? Avoid canned, cured, or processed meats  Buy breads that have less than 80 mg of sodium per slice. Cooking  Eat more home-cooked food and less restaurant, buffet, and fast food.  Avoid adding salt when cooking. Use salt-free seasonings or herbs instead of table salt or sea salt. Check with your health care provider or pharmacist before using salt substitutes.  Cook with plant-based oils, such as canola, sunflower, or olive oil. Meal planning  When eating at a restaurant, ask that your food be prepared with less salt or no salt, if possible.  Avoid foods that contain MSG (monosodium glutamate). MSG is sometimes added to [place] food, bouillon, and some canned foods. What foods are recommended? The items listed may not be a complete list. Talk with your dietitian about what dietary choices are best for you. Grains Low-sodium cereals, including oats, puffed wheat and rice, and shredded wheat. Low-sodium crackers. Unsalted rice. Unsalted pasta. Low-sodium bread. Whole-grain breads and whole-grain pasta. Vegetables Fresh or frozen vegetables. "No salt added" canned vegetables. "No salt added" tomato sauce and paste. Low-sodium or reduced-sodium tomato and vegetable juice. Fruits Fresh, frozen, or canned fruit. Fruit juice. Meats and other protein foods Fresh or frozen (no salt added) meat, poultry, seafood, and fish. Low-sodium canned tuna and salmon. Unsalted nuts. Dried peas, beans, and lentils without added salt. Unsalted canned beans. Eggs. Unsalted nut butters. Dairy Milk. Soy milk. Cheese  that is naturally low in sodium, such as ricotta cheese, fresh mozzarella, or Swiss cheese Low-sodium or reduced-sodium cheese. Cream cheese. Yogurt. Fats and oils Unsalted butter. Unsalted margarine with no trans fat. Vegetable oils such as canola or olive oils. Seasonings and other foods Fresh  and dried herbs and spices. Salt-free seasonings. Low-sodium mustard and ketchup. Sodium-free salad dressing. Sodium-free light mayonnaise. Fresh or refrigerated horseradish. Lemon juice. Vinegar. Homemade, reduced-sodium, or low-sodium soups. Unsalted popcorn and pretzels. Low-salt or salt-free chips. What foods are not recommended? The items listed may not be a complete list. Talk with your dietitian about what dietary choices are best for you. Grains Instant hot cereals. Bread stuffing, pancake, and biscuit mixes. Croutons. Seasoned rice or pasta mixes. Noodle soup cups. Boxed or frozen macaroni and cheese. Regular salted crackers. Self-rising flour. Vegetables Sauerkraut, pickled vegetables, and relishes. Olives. [place] fries. Onion rings. Regular canned vegetables (not low-sodium or reduced-sodium). Regular canned tomato sauce and paste (not low-sodium or reduced-sodium). Regular tomato and vegetable juice (not low-sodium or reduced-sodium). Frozen vegetables in sauces. Meats and other protein foods Meat or fish that is salted, canned, smoked, spiced, or pickled. Bacon, ham, sausage, hotdogs, corned beef, chipped beef, packaged lunch meats, salt pork, jerky, pickled herring, anchovies, regular canned tuna, sardines, salted nuts. Dairy Processed cheese and cheese spreads. Cheese curds. Blue cheese. Feta cheese. String cheese. Regular cottage cheese. Buttermilk. Canned milk. Fats and oils Salted butter. Regular margarine. Ghee. Bacon fat. Seasonings and other foods Onion salt, garlic salt, seasoned salt, table salt, and sea salt. Canned and packaged gravies. Worcestershire sauce. Tartar sauce. Barbecue sauce. Teriyaki sauce. Soy sauce, including reduced-sodium. Steak sauce. Fish sauce. Oyster sauce. Cocktail sauce. Horseradish that you find on the shelf. Regular ketchup and mustard. Meat flavorings and tenderizers. Bouillon cubes. Hot sauce and Tabasco sauce. Premade or packaged marinades. Premade  or packaged taco seasonings. Relishes. Regular salad dressings. Salsa. Potato and tortilla chips. Corn chips and puffs. Salted popcorn and pretzels. Canned or dried soups. Pizza. Frozen entrees and pot pies. Summary  Eating less sodium can help lower your blood pressure, reduce swelling, and protect your heart, liver, and kidneys.  Most people on this plan should limit their sodium intake to 1,500-2,000 mg (milligrams) of sodium each day.  Canned, boxed, and frozen foods are high in sodium. Restaurant foods, fast foods, and pizza are also very high in sodium. You also get sodium by adding salt to food.  Try to cook at home, eat more fresh fruits and vegetables, and eat less fast food, canned, processed, or prepared foods. This information is not intended to replace advice given to you by your health care provider. Make sure you discuss any questions you have with your health care provider. Document Revised: 03/12/2017 Document Reviewed: 03/23/2016 Elsevier Patient Education  2020 [employer].

## 2020-03-13 ENCOUNTER — Encounter: Payer: Self-pay | Admitting: Family Medicine

## 2020-03-13 DIAGNOSIS — I700 Atherosclerosis of aorta: Secondary | ICD-10-CM

## 2020-03-14 MED ORDER — ROSUVASTATIN CALCIUM 5 MG PO TABS: 5.0000 mg | ORAL_TABLET | Freq: Every day | ORAL | 1 refills | Status: DC

## 2020-03-30 ENCOUNTER — Encounter: Payer: Self-pay | Admitting: Family Medicine

## 2020-04-08 HISTORY — PX: CATARACT EXTRACTION: SUR2

## 2020-04-11 ENCOUNTER — Telehealth: Payer: Self-pay | Admitting: Family Medicine

## 2020-04-11 NOTE — Telephone Encounter (Signed)
Pt called and states that he is suppose come back and have non fasting labs done but there are no lab orders in there,  Pt would like to come in today if possible  Pt can be reached at [phone]

## 2020-04-11 NOTE — Telephone Encounter (Signed)
Per message to patient: (copied from 12/1 MyChart message)  "You need to schedule an appointment with me for sometime in the next 2-4 weeks to follow-up in the office on your blood pressure (and a blood test, like we discussed.  You don't need to fast)."  He needs to schedule OV, not just labs.  Can be after I return from vacation (not urgent at all, unless he is having problems)

## 2020-04-11 NOTE — Telephone Encounter (Signed)
Called and left VM for pt

## 2020-04-19 ENCOUNTER — Other Ambulatory Visit: Payer: Self-pay | Admitting: Family Medicine

## 2020-04-19 DIAGNOSIS — I10 Essential (primary) hypertension: Secondary | ICD-10-CM

## 2020-04-24 NOTE — Progress Notes (Signed)
Chief Complaint  Patient presents with  . Hypertension    Fasting med check. Stopped HCTZ about a week ago due to ringing in ears and neck pain.     Patient presents to follow up on hypertension. HCTZ 12.5mg  was added to his 50mg  losartan after noting higher BP's at home.  He had 4 pills left of the HCTZ when he decided to stop taking it--due to urinary frequency and ringing in his ear (he has chronic in the L ear since infection a few years ago)--it got much worse, only in the left.  He also noted urinary urgency, waited too long, had some accidents.  He didn't bring in a list of BP's today.  The last one in the chart was from the first half of December, frequently in 130's-140's, some 150's, and some lower. This was while taking the HCTZ.   He stopped the HCTZ about a week ago.  BP was 134/68 this morning, usually lower in the mornings. At night is when he sometimes sees the higher numbers, up to 150. He notes lower BP's after exercising, and after taking 60mg  sildenafil (which is effective for his ED).  Came here straight from the gym today   He notes crackling and popping when he presses on the back of his right ear, and sometimes when he moves his jaw around. Denies jaw pain. Denies congestion.  He has been eating an apple and yogurt daily, as he read this can lower blood pressure.  Health updates: R cataract surgery 12/27 SCC below R eye, seeing derm. Using effudex 4x/week  PMH, PSH, SH reviewed  Outpatient Encounter Medications as of 04/25/2020  Medication Sig Note  . aspirin 81 MG chewable tablet Chew 81 mg by mouth daily. 04/25/2020: 3-4 times a week  . Cholecalciferol (VITAMIN D-3) 1000 units CAPS Take 1,000 Units by mouth daily.    . fish oil-omega-3 fatty acids 1000 MG capsule Take 1 g by mouth daily.   Marland Kitchen ibuprofen (ADVIL) 200 MG tablet Take 400 mg by mouth as needed. 12/21/2019: Twice weekly  . omeprazole (PRILOSEC) 40 MG capsule Take 40 mg by mouth daily.   .  Pyridoxine HCl (VITAMIN B-6) 500 MG tablet Take 500 mg by mouth daily.   . rosuvastatin (CRESTOR) 5 MG tablet Take 1 tablet (5 mg total) by mouth daily.   . vitamin B-12 (CYANOCOBALAMIN) 1000 MCG tablet Take 1,000 mcg by mouth daily.   . [DISCONTINUED] losartan (COZAAR) 50 MG tablet TAKE 1 TABLET(50 MG) BY MOUTH DAILY   . albuterol (VENTOLIN HFA) 108 (90 Base) MCG/ACT inhaler Inhale 2 puffs into the lungs every 6 (six) hours as needed for wheezing. Use 30 mins prior to exercise (Patient not taking: No sig reported)   . hydrochlorothiazide (MICROZIDE) 12.5 MG capsule Take 1 capsule (12.5 mg total) by mouth daily. (Patient not taking: Reported on 04/25/2020) 04/25/2020: Stopped about a week ago--increased ringing in L ear, urinary frequency  . losartan (COZAAR) 50 MG tablet TAKE 1 TABLET(50 MG) BY MOUTH DAILY   . sildenafil (REVATIO) 20 MG tablet TAKE 2-5 TABLETS BY MOUTH ONCE DAILY AS NEEDED FOR ERECTILE DYSFUNCTION (Patient not taking: No sig reported)    No facility-administered encounter medications on file as of 04/25/2020.   No Known Allergies  ROS: no fever, chills, URI symptoms, headaches, dizziness, chest pain, shortness of breath, GI complaints. Chronic tinnitus in L ear.  Crackling/popping of R ear per HPI. No edema. +redness to skin on face in areas he has  been using effudex.   PHYSICAL EXAM:  BP 110/70   Pulse 64   Ht 5\' 8"  (1.727 m)   Wt 163 lb 9.6 oz (74.2 kg)   BMI 24.88 kg/m   Wt Readings from Last 3 Encounters:  02/29/20 166 lb 12.8 oz (75.7 kg)  12/21/19 165 lb (74.8 kg)  11/30/19 164 lb (74.4 kg)   Pleasant, well-appearing, somewhat excitable male, in no distress HEENT: conjunctiva and sclera are clear, EOMI. Wearing mask. Erythema below R eye and some on L cheek (due to effudex use) TM's and EAC's normal bilaterally (pt stated crackling in R ear improved after exam---there was no cerumen.  There may have been a hair that was moved). Neck: no lymphadenopathy or  mass Heart: regular rate and rhythm Lungs: clear bilaterally Abdomen: soft, nontender Extremities: no edema Neuro: alert and oriented, normal strength, gait   ASSESSMENT/PLAN:  Essential hypertension - BP excellent today. Cont on losartan only (off HCTZ) and monitor BP x 2 weeks and send results - Plan: Basic metabolic panel, losartan (COZAAR) 50 MG tablet  Medication monitoring encounter - Plan: Basic metabolic panel   F/u as scheduled in 11/2020, sooner prn   Send BP list in the next 2 weeks to see what your blood pressure is running without the medication. Be sure to write comments in the column to the right (if checked after exercise, salty meal, stress, etc). If BP's are consistently elevated, we will start a DIFFERENT medication, rather than restarting the diuretic due to your side effects. Be sure to continue low sodium diet.

## 2020-04-25 ENCOUNTER — Ambulatory Visit (INDEPENDENT_AMBULATORY_CARE_PROVIDER_SITE_OTHER): Payer: Medicare Other | Admitting: Family Medicine

## 2020-04-25 ENCOUNTER — Encounter: Payer: Self-pay | Admitting: Family Medicine

## 2020-04-25 ENCOUNTER — Other Ambulatory Visit: Payer: Self-pay

## 2020-04-25 VITALS — BP 110/70 | HR 64 | Ht 68.0 in | Wt 163.6 lb

## 2020-04-25 DIAGNOSIS — Z5181 Encounter for therapeutic drug level monitoring: Secondary | ICD-10-CM

## 2020-04-25 DIAGNOSIS — I10 Essential (primary) hypertension: Secondary | ICD-10-CM | POA: Diagnosis not present

## 2020-04-25 MED ORDER — LOSARTAN POTASSIUM 50 MG PO TABS: ORAL_TABLET | ORAL | 1 refills | Status: DC

## 2020-04-25 NOTE — Patient Instructions (Signed)
Send BP list in the next 2 weeks to see what your blood pressure is running without the medication. Be sure to write comments in the column to the right (if checked after exercise, salty meal, stress, etc). If BP's are consistently elevated, we will start a DIFFERENT medication, rather than restarting the diuretic due to your side effects. Be sure to continue low sodium diet.

## 2020-04-26 LAB — BASIC METABOLIC PANEL
BUN/Creatinine Ratio: 13 (ref 10–24)
BUN: 11 mg/dL (ref 8–27)
CO2: 24 mmol/L (ref 20–29)
Calcium: 9.1 mg/dL (ref 8.6–10.2)
Chloride: 90 mmol/L — ABNORMAL LOW (ref 96–106)
Creatinine, Ser: 0.83 mg/dL (ref 0.76–1.27)
GFR calc Af Amer: 103 mL/min/{1.73_m2} (ref >59)
GFR calc non Af Amer: 89 mL/min/{1.73_m2} (ref >59)
Glucose: 79 mg/dL (ref 65–99)
Potassium: 5.2 mmol/L (ref 3.5–5.2)
Sodium: 125 mmol/L — ABNORMAL LOW (ref 134–144)

## 2020-05-09 ENCOUNTER — Encounter: Payer: Self-pay | Admitting: Family Medicine

## 2020-05-20 DIAGNOSIS — H2512 Age-related nuclear cataract, left eye: Secondary | ICD-10-CM | POA: Diagnosis not present

## 2020-05-20 HISTORY — PX: CATARACT EXTRACTION: SUR2

## 2020-05-27 ENCOUNTER — Encounter: Payer: Self-pay | Admitting: Family Medicine

## 2020-05-27 ENCOUNTER — Other Ambulatory Visit: Payer: Self-pay

## 2020-05-27 ENCOUNTER — Ambulatory Visit (INDEPENDENT_AMBULATORY_CARE_PROVIDER_SITE_OTHER): Payer: Medicare Other | Admitting: Family Medicine

## 2020-05-27 VITALS — BP 140/80 | HR 60 | Temp 97.4°F | Ht 68.0 in | Wt 170.0 lb

## 2020-05-27 DIAGNOSIS — M542 Cervicalgia: Secondary | ICD-10-CM | POA: Diagnosis not present

## 2020-05-27 DIAGNOSIS — S60412A Abrasion of right middle finger, initial encounter: Secondary | ICD-10-CM
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
## 2020-05-27 NOTE — Patient Instructions (Signed)
Keep the wound on the finger clean.  Use bacitracin ointment until healed. Return if increasing redness, drainage, crusting, pain, fever.  Continue to use heat to the neck, followed by stretches, doing this at least 2x/day. Consider switching pillow (whether it is a contoured cervical pillow or other).  Take meloxicam 75.mg once daily with food.  If this dose isn't effective, and if it isn't bothering your stomach, you can increase to 15mg  (2 of the tablets you have at home). You may take the meloxicam for up to 2 weeks. If you are much better, you can cancel your physical therapy.

## 2020-05-29 ENCOUNTER — Ambulatory Visit (INDEPENDENT_AMBULATORY_CARE_PROVIDER_SITE_OTHER): Payer: Medicare Other | Admitting: Family Medicine

## 2020-05-29 ENCOUNTER — Other Ambulatory Visit: Payer: Self-pay

## 2020-05-29 ENCOUNTER — Encounter: Payer: Self-pay | Admitting: Family Medicine

## 2020-05-29 ENCOUNTER — Ambulatory Visit: Payer: Medicare Other | Admitting: Family Medicine

## 2020-05-29 VITALS — BP 160/90 | HR 80 | Temp 97.8°F | Ht 68.0 in | Wt 170.0 lb

## 2020-05-29 DIAGNOSIS — S81811A Laceration without foreign body, right lower leg, initial encounter: Secondary | ICD-10-CM | POA: Diagnosis not present

## 2020-05-29 NOTE — Progress Notes (Signed)
Chief Complaint  Patient presents with  . Abrasion    Was getting up from booth in [place] am. Scraped right calf and wants to make it's not infected.    Yesterday morning, while at [facility], he had some fecal urgency, got up quickly from his seat to go to the bathroom and his right calf scraped a sharp edge on the bench. Injured through his thick sweatpants, which weren't torn.  He washed the area, applied neosporin, bandage, then went to the gym Last night it bled through his large bandage.  Stings a little, not particularly painful  PMH, PSH, SH reviewed  Immunization History  Administered Date(s) Administered  . Fluad Quad(high Dose 65+) 02/20/2019, 12/21/2019  . Influenza Whole 01/05/2008  . Influenza, High Dose Seasonal PF 02/06/2016, 06/21/2017, 02/16/2018  . Influenza,inj,Quad PF,6+ Mos 02/01/2013, 05/28/2015  . Influenza,inj,quad, With Preservative 02/20/2019, 03/13/2020  . PFIZER(Purple Top)SARS-COV-2 Vaccination 05/20/2019, 06/13/2019, 02/29/2020  . Pneumococcal Conjugate-13 07/04/2015  . Pneumococcal Polysaccharide-23 06/01/2007, 07/09/2016  . Td 06/20/1997, 07/21/2017  . Tdap 01/05/2008  . Zoster 02/25/2012   Outpatient Encounter Medications as of 05/29/2020  Medication Sig Note  . aspirin 81 MG chewable tablet Chew 81 mg by mouth daily. 04/25/2020: 3-4 times a week  . Cholecalciferol (VITAMIN D-3) 1000 units CAPS Take 1,000 Units by mouth daily.    . fish oil-omega-3 fatty acids 1000 MG capsule Take 1 g by mouth daily.   Marland Kitchen ibuprofen (ADVIL) 200 MG tablet Take 400 mg by mouth as needed. 12/21/2019: Twice weekly  . losartan (COZAAR) 50 MG tablet TAKE 1 TABLET(50 MG) BY MOUTH DAILY   . meloxicam (MOBIC) 7.5 MG tablet Take 7.5 mg by mouth daily.   Marland Kitchen omeprazole (PRILOSEC) 40 MG capsule Take 40 mg by mouth daily.   . Pyridoxine HCl (VITAMIN B-6) 500 MG tablet Take 500 mg by mouth daily.   . rosuvastatin (CRESTOR) 5 MG tablet Take 1 tablet (5 mg total) by mouth daily.    . vitamin B-12 (CYANOCOBALAMIN) 1000 MCG tablet Take 1,000 mcg by mouth daily.   Marland Kitchen albuterol (VENTOLIN HFA) 108 (90 Base) MCG/ACT inhaler Inhale 2 puffs into the lungs every 6 (six) hours as needed for wheezing. Use 30 mins prior to exercise (Patient not taking: No sig reported)   . sildenafil (REVATIO) 20 MG tablet TAKE 2-5 TABLETS BY MOUTH ONCE DAILY AS NEEDED FOR ERECTILE DYSFUNCTION (Patient not taking: Reported on 05/29/2020)    No facility-administered encounter medications on file as of 05/29/2020.   No Known Allergies  ROS: no fever, chills, URI, chest pain, shortness of breath.  +bleeding/bruising from injury.  No other complaints  PHYSICAL EXAM:  BP (!) 160/90   Pulse 80   Temp 97.8 F (36.6 C) (Tympanic)   Ht 5\' 8"  (1.727 m)   Wt 170 lb (77.1 kg)   BMI 25.85 kg/m   Pleasant male, no distress. Posterior R calf: 3.5 cm triangular skin flap on right posterior calf. Initially the skin edge was curled up, leaving wound base exposed and oozing.  There is surrounding bruising at the right calf. No erythema Asymmetry to legs is chronic.  No acute focal swelling, no erythema, warmth.  Area was soaked, cleaned, and the folded over skin edge was straightened out to cover wound.  2 steri-strips were placed in criss cross fashion to keep the skin flap in place. Wound was then dressed with bacitracin and bandage.   Noninfected skin tear of right lower extremity, initial encounter - counseled re:  likely nonviability of flap, what to expect with healing, s/sx of infection  Tetanus UTD.  I spent 32 minutes dedicated to the care of this patient, including pre-visit review of records, face to face time including procedure, post-visit ordering of testing and documentation.

## 2020-05-29 NOTE — Patient Instructions (Signed)
I applied antibacterial ointment and a couple of steri-strips in order to keep the skin flap over the wound.  Keep the area clean, avoid direct trauma or irritation which will cause the skin flap to move again and re-open the wound.  If you have increasing redness, drainage, pain or fever, contact us right away.     Skin Tear A skin tear is a wound in which the top layers of skin have peeled off from the deeper skin or tissues underneath. This is a common problem as people get older because the skin becomes thinner and more fragile. In addition, some medicines, such as oral corticosteroids, can lead to thinning skin if they are taken for long periods of time. A skin tear is often repaired with tape or skin adhesive strips. Depending on the location of the wound, a bandage (dressing) may be applied over the tape or adhesive strips. Follow these instructions at home: Wound care  Clean the wound as told by your health care provider. You may be instructed to keep the wound dry for the first few days. If you are told to clean the wound: ? Wash the wound as told by your health care provider. This may include using mild soap and water, a wound cleanser, or a salt-water (saline) solution. ? If using soap, rinse the wound with water to remove all soap. ? Do not rub the wound dry. Pat it gently with a clean towel or let it air-dry.  Change any dressings as told by your health care provider. This may include changing the dressing if it gets wet, gets dirty, or starts to smell bad. ? Wash your hands with soap and water for at least 20 seconds before and after you change your bandage (dressing). If soap and water are not available, use hand sanitizer. ? Leave tape or skin adhesive strips in place. These skin closures may need to stay in place for 2 weeks or longer. If adhesive strip edges start to loosen and curl up, you may trim the loose edges. Do not remove adhesive strips completely unless your health  care provider tells you to do that.  Check your wound every day for signs of infection. Check for: ? Redness, swelling, or pain. ? More fluid or blood. ? Warmth. ? Pus or a bad smell.  Do not scratch or pick at the wound.  Protect the injured area until it has healed.   Medicines  Take or apply over-the-counter and prescription medicines only as told by your health care provider.  If you were prescribed an antibiotic medicine, take or apply it as told by your health care provider. Do not stop using the antibiotic even if your condition improves. General instructions  Keep the dressing dry as told by your health care provider.  Do not take baths, swim, use a hot tub, or do anything that puts your wound underwater until your health care provider approves. Ask your health care provider if you may take showers. You may only be allowed to take sponge baths.  Keep all follow-up visits. This is important.   Contact a health care provider if:  You have redness, swelling, or pain around your wound.  You have more fluid or blood coming from your wound.  Your wound, or the area around your wound, feels warm to the touch.  You have pus or a bad smell coming from your wound. Get help right away if:  You have a red streak that goes away from  the skin tear.  You have a fever and chills, and your symptoms suddenly get worse. Summary  A skin tear is a wound in which the top layers of skin have peeled off from the deeper skin or tissues underneath.  A skin tear is often repaired with tape or skin adhesive strips, and a bandage (dressing) may be applied over the tape or the adhesive strips.  Change any dressings as told by your health care provider.  Take or apply over-the-counter and prescription medicines only as told by your health care provider.  Contact a health care provider if you have signs of infection. This information is not intended to replace advice given to you by your  health care provider. Make sure you discuss any questions you have with your health care provider. Document Revised: 07/05/2019 Document Reviewed: 07/05/2019 Elsevier Patient Education  [place].

## 2020-06-13 DIAGNOSIS — D04112 Carcinoma in situ of skin of right lower eyelid, including canthus: Secondary | ICD-10-CM | POA: Diagnosis not present

## 2020-07-04 DIAGNOSIS — T161XXA Foreign body in right ear, initial encounter: Secondary | ICD-10-CM | POA: Diagnosis not present

## 2020-07-08 ENCOUNTER — Ambulatory Visit: Payer: Medicare Other | Admitting: Physical Therapy

## 2020-07-10 ENCOUNTER — Encounter: Payer: Self-pay | Admitting: Family Medicine

## 2020-08-08 DIAGNOSIS — D04112 Carcinoma in situ of skin of right lower eyelid, including canthus: Secondary | ICD-10-CM | POA: Diagnosis not present

## 2020-09-03 ENCOUNTER — Encounter: Payer: Self-pay | Admitting: Family Medicine

## 2020-09-23 DIAGNOSIS — C44622 Squamous cell carcinoma of skin of right upper limb, including shoulder: Secondary | ICD-10-CM | POA: Diagnosis not present

## 2020-09-23 DIAGNOSIS — D485 Neoplasm of uncertain behavior of skin: Secondary | ICD-10-CM | POA: Diagnosis not present

## 2020-09-23 DIAGNOSIS — L570 Actinic keratosis: Secondary | ICD-10-CM | POA: Diagnosis not present

## 2020-09-23 DIAGNOSIS — D04112 Carcinoma in situ of skin of right lower eyelid, including canthus: Secondary | ICD-10-CM | POA: Diagnosis not present

## 2020-10-09 ENCOUNTER — Encounter: Payer: Self-pay | Admitting: Family Medicine

## 2020-10-17 ENCOUNTER — Other Ambulatory Visit: Payer: Self-pay | Admitting: Family Medicine

## 2020-10-17 DIAGNOSIS — I700 Atherosclerosis of aorta: Secondary | ICD-10-CM

## 2020-11-04 DIAGNOSIS — C44622 Squamous cell carcinoma of skin of right upper limb, including shoulder: Secondary | ICD-10-CM | POA: Diagnosis not present

## 2020-11-04 DIAGNOSIS — L989 Disorder of the skin and subcutaneous tissue, unspecified: Secondary | ICD-10-CM | POA: Diagnosis not present

## 2020-11-07 DIAGNOSIS — Z20822 Contact with and (suspected) exposure to covid-19: Secondary | ICD-10-CM | POA: Diagnosis not present

## 2020-11-08 ENCOUNTER — Encounter: Payer: Self-pay | Admitting: Family Medicine

## 2020-11-08 ENCOUNTER — Telehealth (INDEPENDENT_AMBULATORY_CARE_PROVIDER_SITE_OTHER): Payer: Medicare Other | Admitting: Family Medicine

## 2020-11-08 VITALS — Temp 98.6°F | Wt 163.0 lb

## 2020-11-08 DIAGNOSIS — U071 COVID-19: Secondary | ICD-10-CM

## 2020-11-08 NOTE — Progress Notes (Signed)
   Subjective:    Patient ID: Wayne Brandt, male    DOB: 1950/08/24, 70 y.o.   MRN: [id number]  HPI Documentation for virtual audio and video telecommunications through [place] encounter: The patient was located at home. 2 patient identifiers used.  The provider was located in the office. The patient did consent to this visit and is aware of possible charges through their insurance for this visit. The other persons participating in this telemedicine service were none. Time spent on call was 10 minutes and in review of previous records >20 minutes total for counseling and coordination of care. This virtual service is not related to other E/M service within previous 7 days.  On Monday he developed a slight cough and malaise with fever.  Also had some nausea.  He has had 3 COVID vaccines.  He finally tested yesterday and was positive.  He states he does feel slightly better today but no shortness of breath and his last temperature was normal.  Review of Systems     Objective:   Physical Exam Alert and in no distress with a normal breathing pattern.       Assessment & Plan:  COVID-19 I explained that no further testing needs to be done.  Explained he is now on day 4 and seems to be doing fairly well and intervention at this point would not be necessary other than Tylenol for the fever aches and pains.  He can also use codeine cough syrup at night to help with this.  If his symptoms worsen in next day or so he is to call me.  He was comfortable with that.

## 2020-11-11 DIAGNOSIS — D0460 Carcinoma in situ of skin of unspecified upper limb, including shoulder: Secondary | ICD-10-CM | POA: Insufficient documentation

## 2020-11-12 DIAGNOSIS — B370 Candidal stomatitis: Secondary | ICD-10-CM | POA: Diagnosis not present

## 2020-11-13 ENCOUNTER — Encounter: Payer: Self-pay | Admitting: Family Medicine

## 2020-11-19 ENCOUNTER — Encounter: Payer: Self-pay | Admitting: Family Medicine

## 2020-11-20 ENCOUNTER — Other Ambulatory Visit: Payer: Self-pay | Admitting: *Deleted

## 2020-11-20 ENCOUNTER — Other Ambulatory Visit: Payer: Self-pay | Admitting: Family Medicine

## 2020-11-20 MED ORDER — OMEPRAZOLE 40 MG PO CPDR: 40.0000 mg | DELAYED_RELEASE_CAPSULE | Freq: Every day | ORAL | 0 refills | Status: DC

## 2020-11-24 NOTE — Progress Notes (Signed)
Chief Complaint  Patient presents with   other    Pt. Had covid 3 weeks ago and stopped up since then ears stopped up and annoying cough, congestion    He had COVID the end of July.  He had virtual visit with Dr. Redmond School on day 4, was doing well, elected not to treat with antiviral medications.  He sent a message on 8/9 stating "left me with a nasty cough and my head is stopped up..also, I now have a hearing loss, I guess because my head is stopped up..have been taking Mucinex DM twice a day".  He was encouraged to continue Mucinex, add flonase and do sinus rinses.  Currently he reports both ears feeling plugged, decreased hearing bilaterally--no change since starting Flonase.  He does report that his smell returned since starting the Flonase. He continues to cough--he is still taking hydrocodone syrup at bedtime that he has left. Phlegm is clear, but a little thick.  He does sinus rinses daily, no purulent drainage.  He has been using 2 sprays of flonase daily, and taking mucinex DM twice daily.  He is also complaining of some R thigh muscle soreness and neck pain/soreness since starting rosuvastatin. He hasn't take it in a few days, and neck is feeling better.   PMH, PSH, SH reviewed  Outpatient Encounter Medications as of 11/25/2020  Medication Sig Note   aspirin 81 MG chewable tablet Chew 81 mg by mouth daily. 04/25/2020: 3-4 times a week   benzonatate (TESSALON) 200 MG capsule Take 1 capsule (200 mg total) by mouth 3 (three) times daily as needed for cough.    Cholecalciferol (VITAMIN D-3) 1000 units CAPS Take 1,000 Units by mouth daily.     fish oil-omega-3 fatty acids 1000 MG capsule Take 1 g by mouth daily.    ibuprofen (ADVIL) 200 MG tablet Take 400 mg by mouth as needed. 12/21/2019: Twice weekly   losartan (COZAAR) 50 MG tablet TAKE 1 TABLET(50 MG) BY MOUTH DAILY    omeprazole (PRILOSEC) 40 MG capsule Take 1 capsule (40 mg total) by mouth daily.    predniSONE (STERAPRED UNI-PAK 21  TAB) 10 MG (21) TBPK tablet Take as directed    Pyridoxine HCl (VITAMIN B-6) 500 MG tablet Take 500 mg by mouth daily.    rosuvastatin (CRESTOR) 5 MG tablet TAKE 1 TABLET(5 MG) BY MOUTH DAILY    vitamin B-12 (CYANOCOBALAMIN) 1000 MCG tablet Take 1,000 mcg by mouth daily.    albuterol (VENTOLIN HFA) 108 (90 Base) MCG/ACT inhaler Inhale 2 puffs into the lungs every 6 (six) hours as needed for wheezing. Use 30 mins prior to exercise (Patient not taking: No sig reported)    meloxicam (MOBIC) 7.5 MG tablet Take 7.5 mg by mouth daily.    sildenafil (REVATIO) 20 MG tablet TAKE 2-5 TABLETS BY MOUTH ONCE DAILY AS NEEDED FOR ERECTILE DYSFUNCTION (Patient not taking: Reported on 05/29/2020)    No facility-administered encounter medications on file as of 11/25/2020.   NOT taking prednisone or tessalon prior to visit  No Known Allergies  ROS: no fever, chills, headaches, dizziness, chest pain, shortness of breath. +neck pain, R thigh pain, both better since not taking crestor for a few days.  +cough, decreased hearing, PND per HPI.  No n/v/d or rashes   PHYSICAL EXAM:  BP 122/80   Pulse (!) 58   Temp 98.2 F (36.8 C)   Wt 167 lb 3.2 oz (75.8 kg)   BMI 25.42 kg/m   Pleasant male,  in no distress. Occasional cough during visit, especially after deep breaths and talking. HEENT: conjunctiva and sclera are clear, EOMI. TM's and EAC's normal. No cerumen, no effusions or erythema. Nasal mucosa--mildly edematous L>R with clear mucus. Sinuses are nontender, OP is clear Neck: no lymphadenopathy or mass Heart: regular rate and rhythm Lungs clear bilaterally, though deep breaths triggered coughing spell, dry/hacky.  No wheezes, rales, ronchi Extremities: no edema Skin: no rashes. Large bruising/purpura on both hands/arms (largest at L wrist). Psych: normal mood, affect, hygiene and grooming   ASSESSMENT/PLAN:  History of COVID-19 - with persistent cough and head congestion  Dysfunction of both  eustachian tubes - Cont Flonase; add prednisone taper. Reassured no e/o infection - Plan: predniSONE (STERAPRED UNI-PAK 21 TAB) 10 MG (21) TBPK tablet  Cough - post-viral, and likely with wheezing component, pt with asthma/RAD. Tessalon, prednisone, albuterol prn, esp prior to exercise - Plan: predniSONE (STERAPRED UNI-PAK 21 TAB) 10 MG (21) TBPK tablet, benzonatate (TESSALON) 200 MG capsule  Myalgia - possibly related to statin. To check CoQ10 dose (increase if needed), and can change Crestor to qod dosing,  Aortic atherosclerosis (HCC) - cont statin, but can decrease to qod to see if tolerates it better  Senile purpura ([place])   Take the steroids as directed. Use your inhaler prior to exercise and as needed with a lot of coughing or shortness of breath. Use the benzonatate three times daily only if needed for cough.

## 2020-11-25 ENCOUNTER — Other Ambulatory Visit: Payer: Self-pay

## 2020-11-25 ENCOUNTER — Ambulatory Visit (INDEPENDENT_AMBULATORY_CARE_PROVIDER_SITE_OTHER): Payer: Medicare Other | Admitting: Family Medicine

## 2020-11-25 ENCOUNTER — Encounter: Payer: Self-pay | Admitting: Family Medicine

## 2020-11-25 VITALS — BP 122/80 | HR 58 | Temp 98.2°F | Wt 167.2 lb

## 2020-11-25 DIAGNOSIS — R059 Cough, unspecified: Secondary | ICD-10-CM | POA: Diagnosis not present

## 2020-11-25 DIAGNOSIS — H6983 Other specified disorders of Eustachian tube, bilateral: Secondary | ICD-10-CM | POA: Diagnosis not present

## 2020-11-25 DIAGNOSIS — M791 Myalgia, unspecified site: Secondary | ICD-10-CM | POA: Diagnosis not present

## 2020-11-25 DIAGNOSIS — D692 Other nonthrombocytopenic purpura: Secondary | ICD-10-CM

## 2020-11-25 DIAGNOSIS — I700 Atherosclerosis of aorta: Secondary | ICD-10-CM

## 2020-11-25 DIAGNOSIS — Z8616 Personal history of COVID-19: Secondary | ICD-10-CM

## 2020-11-25 MED ORDER — BENZONATATE 200 MG PO CAPS: 200.0000 mg | ORAL_CAPSULE | Freq: Three times a day (TID) | ORAL | 0 refills | Status: DC | PRN

## 2020-11-25 MED ORDER — PREDNISONE 10 MG (21) PO TBPK: ORAL_TABLET | ORAL | 0 refills | Status: DC

## 2020-11-25 NOTE — Patient Instructions (Addendum)
  Take the steroids as directed. Use your inhaler prior to exercise and as needed with a lot of coughing or shortness of breath. Use the benzonatate three times daily only if needed for cough.  Look at the dose of coenzyme Q10, and if on a low dose, you can increase it (compare to available doses at the store) You can cut back to taking the rosuvastatin every other day.

## 2020-12-02 DIAGNOSIS — H6502 Acute serous otitis media, left ear: Secondary | ICD-10-CM | POA: Diagnosis not present

## 2020-12-02 DIAGNOSIS — R059 Cough, unspecified: Secondary | ICD-10-CM | POA: Diagnosis not present

## 2020-12-03 NOTE — Patient Instructions (Addendum)
  HEALTH MAINTENANCE RECOMMENDATIONS:  It is recommended that you get at least 30 minutes of aerobic exercise at least 5 days/week (for weight loss, you may need as much as 60-90 minutes). This can be any activity that gets your heart rate up. This can be divided in 10-15 minute intervals if needed, but try and build up your endurance at least once a week.  Weight bearing exercise is also recommended twice weekly.  Eat a healthy diet with lots of vegetables, fruits and fiber.  "Colorful" foods have a lot of vitamins (ie green vegetables, tomatoes, red peppers, etc).  Limit sweet tea, regular sodas and alcoholic beverages, all of which has a lot of calories and sugar.  Up to 2 alcoholic drinks daily may be beneficial for men (unless trying to lose weight, watch sugars).  Drink a lot of water.  Sunscreen of at least SPF 30 should be used on all sun-exposed parts of the skin when outside between the hours of 10 am and 4 pm (not just when at beach or pool, but even with exercise, golf, tennis, and yard work!)  Use a sunscreen that says "broad spectrum" so it covers both UVA and UVB rays, and make sure to reapply every 1-2 hours.  Remember to change the batteries in your smoke detectors when changing your clock times in the spring and fall.  Carbon monoxide detectors are recommended for your home.  Use your seat belt every time you are in a car, and please drive safely and not be distracted with cell phones and texting while driving.    Wayne Brandt , Thank you for taking time to come for your Medicare Wellness Visit. I appreciate your ongoing commitment to your health goals. Please review the following plan we discussed and let me know if I can assist you in the future.     This is a list of the screening recommended for you and due dates:  Health Maintenance  Topic Date Due   Zoster (Shingles) Vaccine (1 of 2) Never done   COVID-19 Vaccine (4 - Booster for Pfizer series) 05/31/2020   Flu Shot   11/11/2020   Colon Cancer Screening  02/16/2024   Tetanus Vaccine  07/22/2027   Hepatitis C Screening: USPSTF Recommendation to screen - Ages 18-79 yo.  Completed   Pneumonia vaccines  Completed   HPV Vaccine  Aged Out   Continue to get yearly high dose flu shots (we do not have it in the office today--check back in a couple of weeks).  I recommend getting the new shingles vaccine (Shingrix). Since you have Medicare, you will need to get this from the pharmacy, as it is covered by Part D. This is a series of 2 injections, spaced 2 months apart.  This should be separated from any other vaccines by at least 2 weeks.  I recommend that you get the newly formulated COVID vaccine (better coverage for variants) once it becomes available in the late Fall. Pay attention to the news about this.  Continue the rosuvastatin--we will check to ensure that you taking it 3x/week is adequate (I'm pretty sure it will be just fine). Have the pharmacy contact us when you need refills.   Other medication refills were sent today.  Stop taking aspirin--given your stomach issues, I think there is likely more harm than good from it.  Please return your notarized living will and healthcare power of attorney so that we can scan it into your chart.

## 2020-12-03 NOTE — Progress Notes (Signed)
Chief Complaint  Patient presents with   Medicare Wellness    Fasting AWV. No concerns. Has not gotten his Shingrix yet. He said he is not sure if he needs new POA forms.     Wayne Brandt is a 70 y.o. male who presents for annual physical exam Medicare wellness visit and follow-up on chronic medical conditions.    He saw ENT earlier this week with persistent stopped up ears and cough s/p COVID.  He had some improvement with prednisone course, and smell improved since using Flonase. He was diagnosed with serous otitis media L ear, and told to continue flonase, saline irrigations daily, valsalva maneuvers daily, and that he may continue tessalon (this was refilled by ENT), and DM prn cough. He notes some decreased hearing in both ears.  He is able to pop the L ear, but not the R. He states the cough is improving, and he is feeling better, notes especially when exercising at the gym that he is doing better.  Now only having trouble in the heat/humidity.  Hypertension:  He remains on Losartan 47m daily.  He denies side effects. We had started HCTZ 12.514min 03/2020 after noting higher BP's at home, but he stopped due to urinary frequency/urgency and worsening of tinnitus. BP's have remained good.   BP's are running 116-130/60's. He denies headaches, dizziness, chest pain, shortness of breath, swelling.      BPH: s/p TURP 05/2016 by Dr. OtKarsten RoHe gets up 3-4x/night, related to drinking tea during the day, beer in the evenings. Since cutting back to just 1 beer in the evening, he thinks it has helped, has had some nights where he is only up 2x/night, sometimes still 4x. This is not bothersome to him, he is able to get back to sleep.  He was told he has a very small bladder. Peyronie's disease and erectile issues.  Asymptomatic with respect to Peyronie's. No treatment was recommended.  He is not in a sexual relationship with his wife (due to her health issues, fibromyalgia, etc.). He only follows  with him prn (not yearly), not seen in the last few years.   H/o RAD/asthma. Only has problems at Christmas (when around pets at his family's house), uses an inhaler with good results. He has used the albuterol some recently (with cough s/p COVID), but unsure if it helps.  Denies wheezing.   Eosinophilic esophagitis: Under the care of Dr. MeEarlean Shawlhasn't been seen by him in 2 years.  Previously required EGD and dilatations; last of which was in 11/2018, showing mod erosive duodenitis, mild superficial gastritis.  He remains on omeprazole. Currently denies any dysphagia or heartburn.  He has to be careful with what he eats, and chew his food well (ie apple peel). He is using Flonase for eustachian tube issues/serous otitis, not for EE, not using Flovent.  Aortic atherosclerosis:  Noted on CXR in 2017. He initially declined statin treatment. He was sent for CT cardiac scoring due to strong FH heart disease and atherosclerosis. This showed (12/2019): IMPRESSION: 1. Coronary artery calcium score of 73. This places the patient in the 38th percentile for subjects of the same age, gender and race/ethnicity. 2. Mild dilation of the ascending thoracic aorta, which measures up to 3.5 cm. Recommend annual imaging followup by CTA or MRA.  3. Small perifissural nodules in the RIGHT chest largest measuring 6 x 4 mm. No routine follow-up imaging is recommended for these intrapulmonary lymph nodes. Aortic Atherosclerosis (ICD10-I70.0).  He was  started on Crestor 5mg daily after this study. He is currently taking Crestor 3x/week, and has noticed his neck feels better since cutting back.  He cut back about 2  months ago. He will need repeat CTA or MRA in 12/2020 to follow-up on the mild dilation of thoracic aorta. Lab Results  Component Value Date   CHOL 161 02/19/2020   HDL 83 02/19/2020   LDLCALC 68 02/19/2020   TRIG 49 02/19/2020   CHOLHDL 1.9 02/19/2020   Vitamin D deficiency (mild):  Level was low at  27 lin 2016, last check was normal (40.7) in 2019. He is currently taking 2 capsules of 1000 IU daily.  Immunization History  Administered Date(s) Administered   Fluad Quad(high Dose 65+) 02/20/2019, 12/21/2019   Influenza Whole 01/05/2008   Influenza, High Dose Seasonal PF 02/06/2016, 06/21/2017, 02/16/2018   Influenza,inj,Quad PF,6+ Mos 02/01/2013, 05/28/2015   Influenza,inj,quad, With Preservative 02/20/2019, 03/13/2020   PFIZER(Purple Top)SARS-COV-2 Vaccination 05/20/2019, 06/13/2019, 02/29/2020   Pneumococcal Conjugate-13 07/04/2015   Pneumococcal Polysaccharide-23 06/01/2007, 07/09/2016   Td 06/20/1997, 07/21/2017   Tdap 01/05/2008   Zoster, Live 02/25/2012   Last colonoscopy: 02/2014; pt states told 10 years Last PSA: Lab Results  Component Value Date   PSA1 0.3 11/30/2019   PSA1 0.3 11/14/2018   PSA1 0.3 07/21/2017   PSA 0.85 07/04/2015   PSA 0.73 06/28/2014   PSA 1.43 06/26/2013   Dentist: twice yearly   Ophtho: yearly Exercise: daily (elliptical, bike) for 20-30 minutes every day; also playing tennis, golf.   Also uses rowing machine at weight machines at the gym (every other day)   Fall screen: negative Depression screen: negative Functional status survey: notable only for occasional urge incontinence Mini-Cog screen: normal See full screens in epic.   Other doctors caring for patient include: ENT: Dr. Bates (and his PA) Urologist: Dr. Ottelin (sees prn) Dentist: Dr. Kaplan Dermatologist: Dr. Haverstock Cardiologist: Dr. Hochrein GI: Dr. Medoff Ortho: Dr. Aluisio, Dr. Dean (for shoulder), Dr. Weingold, Dr. Supple (R shoulder)   He doesn't have Living Will or Healthcare power of attorney. He has been given paperwork in the past. Unsure if he still has it, given another set   PMH, PSH, SH and FH were reviewed and updated  Outpatient Encounter Medications as of 12/04/2020  Medication Sig Note   albuterol (VENTOLIN HFA) 108 (90 Base) MCG/ACT inhaler Inhale  2 puffs into the lungs every 6 (six) hours as needed for wheezing. Use 30 mins prior to exercise    aspirin 81 MG chewable tablet Chew 81 mg by mouth daily. 12/04/2020: Using 2x/week   benzonatate (TESSALON) 200 MG capsule Take 1 capsule (200 mg total) by mouth 3 (three) times daily as needed for cough.    Cholecalciferol (VITAMIN D-3) 1000 units CAPS Take 2,000 Units by mouth daily.    Coenzyme Q10 (COQ10) 100 MG CAPS Take by mouth.    ibuprofen (ADVIL) 200 MG tablet Take 400 mg by mouth as needed. 12/21/2019: Twice weekly   losartan (COZAAR) 50 MG tablet TAKE 1 TABLET(50 MG) BY MOUTH DAILY    omeprazole (PRILOSEC) 40 MG capsule Take 1 capsule (40 mg total) by mouth daily.    Pyridoxine HCl (VITAMIN B-6) 500 MG tablet Take 500 mg by mouth daily.    rosuvastatin (CRESTOR) 5 MG tablet TAKE 1 TABLET(5 MG) BY MOUTH DAILY 12/04/2020: 3 times a week   vitamin B-12 (CYANOCOBALAMIN) 1000 MCG tablet Take 1,000 mcg by mouth daily.    vitamin C (ASCORBIC ACID)   500 MG tablet Take 500 mg by mouth daily.    fish oil-omega-3 fatty acids 1000 MG capsule Take 1 g by mouth daily. (Patient not taking: Reported on 12/04/2020) 12/04/2020: Recently ran out, plans to get more   [DISCONTINUED] predniSONE (STERAPRED UNI-PAK 21 TAB) 10 MG (21) TBPK tablet Take as directed    No facility-administered encounter medications on file as of 12/04/2020.   No Known Allergies   ROS: The patient denies anorexia, fever, headaches, vision loss, hoarseness, chest pain, palpitations, dizziness, syncope, dyspnea on exertion, swelling, nausea, vomiting, diarrhea, abdominal pain, melena, hematochezia, hematuria, dysuria, weakness, tremor, suspicious skin lesions (sees derm, Dr. Haverstock regularly), depression, anxiety, abnormal bleeding or enlarged lymph nodes   H/o microscopic hematuria--no visible blood per pt   No rectal bleeding since getting hemorrhoidal banding   Occasional tingling R hand from ulnar nerve issue  (chronic)--intermittent, overall doing better. Taking B6 has helped a lot.  Only slight numbness noted today. Denies wrist pain (no longer does pushups), thumb pain. Up 2-4x/night to void. Denies hesitancy, weakened stream. Has some dribbling at the end. Denies dysphagia, heartburn. Occasional ringing in the left ear, tolerable (slightly worse since COVID) Recent plugging of ears and ongoing mild cough since COVID, per HPI Sees dermatology regularly. Easy bruising on arms, no bleeding. Some constipation since he had COVID, needed to use a suppository and Miralax.   PHYSICAL EXAM:  BP 132/70   Pulse (!) 52   Ht 5' 8" (1.727 m)   Wt 162 lb 3.2 oz (73.6 kg)   BMI 24.66 kg/m   Wt Readings from Last 3 Encounters:  12/04/20 162 lb 3.2 oz (73.6 kg)  11/25/20 167 lb 3.2 oz (75.8 kg)  11/08/20 163 lb (73.9 kg)    General Appearance:    Alert, cooperative, no distress, appears stated age. Some dry cough during visit.  Head:    Normocephalic, atraumatic  Eyes:    PERRL, conjunctiva/corneas clear, EOM's intact, fundi benign     Ears:    TM and EAC normal on the right.  Effusion on the left (TM appears slightly white as well, broad light reflex  Nose:    Not examined (wearing mask due to COVID-19 pandemic)    Throat:    Not examined (wearing mask due to COVID-19 pandemic)     Neck:    Supple, no lymphadenopathy; thyroid: no enlargement/tenderness/ nodules; no carotid bruit or JVD     Back:    Spine nontender, no curvature, ROM normal, no CVA tenderness     Lungs:    Clear to auscultation bilaterally without wheezes, rales or ronchi; respirations unlabored     Chest Wall:    No tenderness or deformity     Heart:    Regular rate and rhythm, S1 and S2 normal, no murmur, rub or gallop     Breast Exam:    No chest wall tenderness, masses or gynecomastia     Abdomen:    Soft, non-tender, nondistended, normoactive bowel sounds, no masses, no hepatosplenomegaly     Genitalia:    Circumcised penis,  no rash, lesions, discharge. Testicles are normal, no masses.  No hernias.  Rectal:    Normal sphincter tone.  Heme negative stool.  Prostate is smooth, not enlarged, no nodules. There is a palpable nodule in the rectum on the left.  Anoscopy was performed. Had mild internal hemorrhoids noted, no polyp or other mass noted.  Extremities:    No clubbing, cyanosis or edema. +leg length   discrepancy--left is shorter than right leg (since L ankle fusion per pt). L ankle fused; atrophy of LLE; WHSS R knee, mildly swollen, warm, no erythema, nontender (chronic, unchanged from prior exams). WHSS and swelling/effusion with mild warmth at L ankle today. Thickening and onycholytic changes to many nails.  Bulge (popeye deformity) noted in RUE with contraction of biceps muscle  Pulses:    2+ and symmetric all extremities    Skin:    Skin color, texture, turgor normal, no rashes. Actinic damage throughout. Healing incision R forearm (SCC removed recently) A few AK's on forearm, hands. Purpura/bruising on hands/forearms.   Lymph nodes:    Cervical, supraclavicular, and axillary nodes normal     Neurologic:    Normal strength, sensation and gait; reflexes 2+ and symmetric throughout                   Psych:   Normal mood, affect, hygiene and grooming    ASSESSMENT/PLAN:   Never brought in living will healthcare POA--DOES HE NEED NEW FORMS? Shingrix from pharmacy New COVID booster when available  c-met, cbc, PSA, lipids, Mg  NEEDS CTA or MRA in 12/2020 to follow-up on the mild dilation of thoracic aorta. ORDER Discuss aspirin if taking Told to stop aspirin Prefers CTA at [address]   Needs omeprazole RF.  ?if needs referral to new GI Losartan RF 90d crestor done in July  Uses 1/2 and 1/2 sweet tea  Recommended at least 30 minutes of aerobic activity at least 5 days/week, weight-bearing exercise at least 2x/wk; proper sunscreen use reviewed; healthy diet and alcohol recommendations (less than or  equal to 2 drinks/day) reviewed; regular seatbelt use; changing batteries in smoke detectors. Immunization recommendations discussed--continue high dose flu shots yearly. Shingrix recommended, to get from pharmacy, risks/SE reviewed. Newly formulated COVID vaccine recommended when available in the Fall. Colonoscopy recommendations reviewed--UTD.     Most form completed, Full Code, Full care Patient reminded to do healthcare power of attorney and living will, and get us copies once notarized/completed.  F/u 1 year, sooner if BP's elevated   Medicare Attestation I have personally reviewed: The patient's medical and social history Their use of alcohol, tobacco or illicit drugs Their current medications and supplements The patient's functional ability including ADLs,fall risks, home safety risks, cognitive, and hearing and visual impairment Diet and physical activities Evidence for depression or mood disorders  The patient's weight, height, BMI have been recorded in the chart.  I have made referrals, counseling, and provided education to the patient based on review of the above and I have provided the patient with a written personalized care plan for preventive services.        

## 2020-12-04 ENCOUNTER — Other Ambulatory Visit: Payer: Self-pay

## 2020-12-04 ENCOUNTER — Ambulatory Visit (INDEPENDENT_AMBULATORY_CARE_PROVIDER_SITE_OTHER): Payer: Medicare Other | Admitting: Family Medicine

## 2020-12-04 ENCOUNTER — Encounter: Payer: Self-pay | Admitting: Family Medicine

## 2020-12-04 VITALS — BP 132/70 | HR 52 | Ht 68.0 in | Wt 162.2 lb

## 2020-12-04 DIAGNOSIS — K2970 Gastritis, unspecified, without bleeding: Secondary | ICD-10-CM | POA: Diagnosis not present

## 2020-12-04 DIAGNOSIS — K648 Other hemorrhoids: Secondary | ICD-10-CM | POA: Diagnosis not present

## 2020-12-04 DIAGNOSIS — D692 Other nonthrombocytopenic purpura: Secondary | ICD-10-CM | POA: Diagnosis not present

## 2020-12-04 DIAGNOSIS — I77810 Thoracic aortic ectasia: Secondary | ICD-10-CM | POA: Diagnosis not present

## 2020-12-04 DIAGNOSIS — H6502 Acute serous otitis media, left ear: Secondary | ICD-10-CM | POA: Diagnosis not present

## 2020-12-04 DIAGNOSIS — J45990 Exercise induced bronchospasm: Secondary | ICD-10-CM

## 2020-12-04 DIAGNOSIS — Z5181 Encounter for therapeutic drug level monitoring: Secondary | ICD-10-CM | POA: Diagnosis not present

## 2020-12-04 DIAGNOSIS — Z0000 Encounter for general adult medical examination without abnormal findings: Secondary | ICD-10-CM | POA: Diagnosis not present

## 2020-12-04 DIAGNOSIS — I10 Essential (primary) hypertension: Secondary | ICD-10-CM

## 2020-12-04 DIAGNOSIS — Z125 Encounter for screening for malignant neoplasm of prostate: Secondary | ICD-10-CM | POA: Diagnosis not present

## 2020-12-04 DIAGNOSIS — K6289 Other specified diseases of anus and rectum: Secondary | ICD-10-CM | POA: Diagnosis not present

## 2020-12-04 DIAGNOSIS — I700 Atherosclerosis of aorta: Secondary | ICD-10-CM | POA: Diagnosis not present

## 2020-12-04 DIAGNOSIS — E559 Vitamin D deficiency, unspecified: Secondary | ICD-10-CM | POA: Diagnosis not present

## 2020-12-04 MED ORDER — OMEPRAZOLE 40 MG PO CPDR: 40.0000 mg | DELAYED_RELEASE_CAPSULE | Freq: Every day | ORAL | 3 refills | Status: DC

## 2020-12-04 MED ORDER — LOSARTAN POTASSIUM 50 MG PO TABS: ORAL_TABLET | ORAL | 3 refills | Status: DC

## 2020-12-05 LAB — COMPREHENSIVE METABOLIC PANEL
ALT: 23 IU/L (ref 0–44)
AST: 34 IU/L (ref 0–40)
Albumin/Globulin Ratio: 1.7 (ref 1.2–2.2)
Albumin: 4.3 g/dL (ref 3.7–4.7)
Alkaline Phosphatase: 113 IU/L (ref 44–121)
BUN/Creatinine Ratio: 10 (ref 10–24)
BUN: 8 mg/dL (ref 8–27)
Bilirubin Total: 0.6 mg/dL (ref 0.0–1.2)
CO2: 25 mmol/L (ref 20–29)
Calcium: 9 mg/dL (ref 8.6–10.2)
Chloride: 89 mmol/L — ABNORMAL LOW (ref 96–106)
Creatinine, Ser: 0.81 mg/dL (ref 0.76–1.27)
Globulin, Total: 2.6 g/dL (ref 1.5–4.5)
Glucose: 90 mg/dL (ref 65–99)
Potassium: 5 mmol/L (ref 3.5–5.2)
Sodium: 126 mmol/L — ABNORMAL LOW (ref 134–144)
Total Protein: 6.9 g/dL (ref 6.0–8.5)
eGFR: 94 mL/min/{1.73_m2} (ref >59)

## 2020-12-05 LAB — CBC WITH DIFFERENTIAL/PLATELET
Basophils Absolute: 0 10*3/uL (ref 0.0–0.2)
Basos: 0 %
EOS (ABSOLUTE): 0.3 10*3/uL (ref 0.0–0.4)
Eos: 4 %
Hematocrit: 38.8 % (ref 37.5–51.0)
Hemoglobin: 12.9 g/dL — ABNORMAL LOW (ref 13.0–17.7)
Immature Grans (Abs): 0 10*3/uL (ref 0.0–0.1)
Immature Granulocytes: 1 %
Lymphocytes Absolute: 1.4 10*3/uL (ref 0.7–3.1)
Lymphs: 20 %
MCH: 30.8 pg (ref 26.6–33.0)
MCHC: 33.2 g/dL (ref 31.5–35.7)
MCV: 93 fL (ref 79–97)
Monocytes Absolute: 1.2 10*3/uL — ABNORMAL HIGH (ref 0.1–0.9)
Monocytes: 17 %
Neutrophils Absolute: 3.9 10*3/uL (ref 1.4–7.0)
Neutrophils: 58 %
Platelets: 187 10*3/uL (ref 150–450)
RBC: 4.19 x10E6/uL (ref 4.14–5.80)
RDW: 13.6 % (ref 11.6–15.4)
WBC: 6.8 10*3/uL (ref 3.4–10.8)

## 2020-12-05 LAB — MAGNESIUM: Magnesium: 2 mg/dL (ref 1.6–2.3)

## 2020-12-05 LAB — LIPID PANEL
Chol/HDL Ratio: 2 ratio (ref 0.0–5.0)
Cholesterol, Total: 161 mg/dL (ref 100–199)
HDL: 80 mg/dL (ref >39)
LDL Chol Calc (NIH): 69 mg/dL (ref 0–99)
Triglycerides: 58 mg/dL (ref 0–149)
VLDL Cholesterol Cal: 12 mg/dL (ref 5–40)

## 2020-12-05 LAB — PSA: Prostate Specific Ag, Serum: 0.3 ng/mL (ref 0.0–4.0)

## 2020-12-09 DIAGNOSIS — L570 Actinic keratosis: Secondary | ICD-10-CM | POA: Diagnosis not present

## 2020-12-09 DIAGNOSIS — L821 Other seborrheic keratosis: Secondary | ICD-10-CM | POA: Diagnosis not present

## 2020-12-09 DIAGNOSIS — L578 Other skin changes due to chronic exposure to nonionizing radiation: Secondary | ICD-10-CM | POA: Diagnosis not present

## 2020-12-09 DIAGNOSIS — D225 Melanocytic nevi of trunk: Secondary | ICD-10-CM | POA: Diagnosis not present

## 2020-12-09 DIAGNOSIS — L814 Other melanin hyperpigmentation: Secondary | ICD-10-CM | POA: Diagnosis not present

## 2020-12-09 DIAGNOSIS — Z85828 Personal history of other malignant neoplasm of skin: Secondary | ICD-10-CM | POA: Diagnosis not present

## 2020-12-09 DIAGNOSIS — L309 Dermatitis, unspecified: Secondary | ICD-10-CM | POA: Diagnosis not present

## 2020-12-09 DIAGNOSIS — C44320 Squamous cell carcinoma of skin of unspecified parts of face: Secondary | ICD-10-CM | POA: Diagnosis not present

## 2020-12-26 ENCOUNTER — Other Ambulatory Visit: Payer: Medicare Other

## 2020-12-31 ENCOUNTER — Ambulatory Visit
Admission: RE | Admit: 2020-12-31 | Discharge: 2020-12-31 | Disposition: A | Discharge status: Home / Self Care | Payer: Medicare Other | Source: Ambulatory Visit | Attending: Family Medicine | Admitting: Family Medicine

## 2020-12-31 ENCOUNTER — Other Ambulatory Visit: Payer: Self-pay

## 2020-12-31 DIAGNOSIS — K449 Diaphragmatic hernia without obstruction or gangrene: Secondary | ICD-10-CM | POA: Diagnosis not present

## 2020-12-31 DIAGNOSIS — I2510 Atherosclerotic heart disease of native coronary artery without angina pectoris: Secondary | ICD-10-CM | POA: Diagnosis not present

## 2020-12-31 DIAGNOSIS — I712 Thoracic aortic aneurysm, without rupture: Secondary | ICD-10-CM | POA: Diagnosis not present

## 2020-12-31 DIAGNOSIS — I77810 Thoracic aortic ectasia: Secondary | ICD-10-CM

## 2020-12-31 DIAGNOSIS — J929 Pleural plaque without asbestos: Secondary | ICD-10-CM | POA: Diagnosis not present

## 2020-12-31 MED ORDER — IOPAMIDOL (ISOVUE-370) INJECTION 76%
75.0000 mL | Freq: Once | INTRAVENOUS | Status: AC | PRN
  Administered 2020-12-31: 75 mL via INTRAVENOUS

## 2021-01-02 DIAGNOSIS — H6502 Acute serous otitis media, left ear: Secondary | ICD-10-CM | POA: Diagnosis not present

## 2021-01-05 ENCOUNTER — Encounter: Payer: Self-pay | Admitting: Family Medicine

## 2021-01-06 ENCOUNTER — Other Ambulatory Visit: Payer: Self-pay | Admitting: *Deleted

## 2021-01-06 DIAGNOSIS — I700 Atherosclerosis of aorta: Secondary | ICD-10-CM

## 2021-01-06 MED ORDER — ROSUVASTATIN CALCIUM 5 MG PO TABS
5.0000 mg | ORAL_TABLET | Freq: Every day | ORAL | 1 refills | Status: DC
Start: 2021-01-06 — End: 2021-07-03

## 2021-02-19 ENCOUNTER — Other Ambulatory Visit (INDEPENDENT_AMBULATORY_CARE_PROVIDER_SITE_OTHER): Payer: Medicare Other

## 2021-02-19 ENCOUNTER — Other Ambulatory Visit: Payer: Self-pay

## 2021-02-19 DIAGNOSIS — Z23 Encounter for immunization: Secondary | ICD-10-CM | POA: Diagnosis not present

## 2021-04-08 ENCOUNTER — Encounter: Payer: Self-pay | Admitting: Family Medicine

## 2021-04-08 NOTE — Progress Notes (Signed)
Chief Complaint  Patient presents with   other    BP running high today 137/79 has been running 153/75 155/78 about everyday for the past few weeks   BP's have been 150's/70's in the last few weeks. 137/69 at home today He denies any changes to his diet.  Wife makes a chicken soup, has been eating it this week. Uses low sodium broth. He eats mashed potatoes from [employer] 4x/week. Eats unsalted peanuts, no chips. Eats bacon and egg biscuit once a week from [employer]  He continues to exercise daily.  20-30 mins of cardio daily at the gym, plus weights, and tennis.  We last discussed his BP's at his 11/2020 visit.  At that time, his BP's were good (116-130/60's) on the 50mg  losartan daily.  On review of chart--we had started HCTZ 12.5mg  in 03/2020 after noting higher BP's at home, but he stopped due to urinary frequency/urgency and worsening of tinnitus. BP Readings from Last 3 Encounters:  12/04/20 132/70  11/25/20 122/80  05/29/20 (!) 160/90   PMH, PSH. SH reviewed  Outpatient Encounter Medications as of 04/09/2021  Medication Sig Note   albuterol (VENTOLIN HFA) 108 (90 Base) MCG/ACT inhaler Inhale 2 puffs into the lungs every 6 (six) hours as needed for wheezing. Use 30 mins prior to exercise    Cholecalciferol (VITAMIN D-3) 1000 units CAPS Take 2,000 Units by mouth daily.    Coenzyme Q10 (COQ10) 100 MG CAPS Take by mouth.    fish oil-omega-3 fatty acids 1000 MG capsule Take 1 g by mouth daily. 12/04/2020: Recently ran out, plans to get more   ibuprofen (ADVIL) 200 MG tablet Take 400 mg by mouth as needed. 12/21/2019: Twice weekly   losartan (COZAAR) 50 MG tablet TAKE 1 TABLET(50 MG) BY MOUTH DAILY    omeprazole (PRILOSEC) 40 MG capsule Take 1 capsule (40 mg total) by mouth daily.    Pyridoxine HCl (VITAMIN B-6) 500 MG tablet Take 500 mg by mouth daily.    rosuvastatin (CRESTOR) 5 MG tablet Take 1 tablet (5 mg total) by mouth daily.    vitamin B-12 (CYANOCOBALAMIN) 1000 MCG tablet Take  1,000 mcg by mouth daily.    vitamin C (ASCORBIC ACID) 500 MG tablet Take 500 mg by mouth daily.    aspirin 81 MG chewable tablet Chew 81 mg by mouth daily. (Patient not taking: Reported on 04/09/2021) 12/04/2020: Using 2x/week   benzonatate (TESSALON) 200 MG capsule Take 1 capsule (200 mg total) by mouth 3 (three) times daily as needed for cough. (Patient not taking: Reported on 04/09/2021)    No facility-administered encounter medications on file as of 04/09/2021.   No Known Allergies  ROS: no headaches, dizziness, chest pain, shortness of breath, GI complaints, URI symptom, f/c or other concerns.   PHYSICAL EXAM:  BP (!) 150/82    Pulse (!) 58    Temp 98.3 F (36.8 C)    Wt 169 lb 9.6 oz (76.9 kg)    BMI 25.79 kg/m   134/68 on repeat by MD  Well-appearing male, slightly excitable, in no distress HEENT: conjunctiva and sclera are clear, EOMI, wearing mask Neck: no lymphadenopathy, thyromegaly or mass Heart: regular rate and rhythm Lungs: clear bilaterally Back: no spinal or CVA tenderness Abdomen: soft, nontender, no mass Extremities: no edema Psych: slightly anxious, full range of affect, normal hygiene, grooming, eye contact and speech Neuro: alert and oriented, normal strenght   ASSESSMENT/PLAN:   Essential hypertension - repeat BP better in office.  Running higher  at home, some higher sodium foods. Reviewed diet in detail. No indication to change meds at this time  Patient reassured. Continue current dose of losartan. Reviewed low sodium diet in detail, and the fact that he cannot control the sodium content in the restaurant foods, so may need to make different choices for healthier foods less likely to have sodium (ie salads rather than mashed potatoes).  Discussed the various things that can raise blood pressure. Reviewed s/sx for which further evaluation is needed.  He was given a log sheet. He will keep track and send results if concerned about BP's. Otherwise, f/u  as scheduled in March.

## 2021-04-09 ENCOUNTER — Ambulatory Visit (INDEPENDENT_AMBULATORY_CARE_PROVIDER_SITE_OTHER): Payer: Medicare Other | Admitting: Family Medicine

## 2021-04-09 ENCOUNTER — Encounter: Payer: Self-pay | Admitting: Family Medicine

## 2021-04-09 ENCOUNTER — Other Ambulatory Visit: Payer: Self-pay

## 2021-04-09 VITALS — BP 134/68 | HR 58 | Temp 98.3°F | Wt 169.6 lb

## 2021-04-09 DIAGNOSIS — I10 Essential (primary) hypertension: Secondary | ICD-10-CM | POA: Diagnosis not present

## 2021-04-09 NOTE — Patient Instructions (Signed)
Your blood pressure was much better when checked by Dr. Tomi Bamberger (134/68).  Please try and limit the sodium in the diet--the mashed potatoes are likely veryhigh in sodium (from [employer]).  Limit bacon, and watch the sodium in the soups.  Continue your current medication and daily exercise.  Feel free to send in a list of blood pressure if there are any concerns. You should try and comment on this list if there are reasons why it might be high or low (ie had high salt meal, missed a pill, etc).

## 2021-04-22 ENCOUNTER — Encounter: Payer: Self-pay | Admitting: Family Medicine

## 2021-04-22 DIAGNOSIS — I10 Essential (primary) hypertension: Secondary | ICD-10-CM

## 2021-05-05 ENCOUNTER — Encounter: Payer: Self-pay | Admitting: Family Medicine

## 2021-05-15 DIAGNOSIS — H9313 Tinnitus, bilateral: Secondary | ICD-10-CM | POA: Insufficient documentation

## 2021-05-26 ENCOUNTER — Other Ambulatory Visit: Payer: Self-pay | Admitting: *Deleted

## 2021-05-26 ENCOUNTER — Encounter: Payer: Self-pay | Admitting: Family Medicine

## 2021-05-26 MED ORDER — LOSARTAN POTASSIUM 100 MG PO TABS: 100.0000 mg | ORAL_TABLET | Freq: Every day | ORAL | 0 refills | Status: DC

## 2021-07-03 ENCOUNTER — Other Ambulatory Visit: Payer: Self-pay | Admitting: *Deleted

## 2021-07-03 ENCOUNTER — Encounter: Payer: Self-pay | Admitting: Family Medicine

## 2021-07-03 DIAGNOSIS — I700 Atherosclerosis of aorta: Secondary | ICD-10-CM

## 2021-07-03 MED ORDER — ROSUVASTATIN CALCIUM 5 MG PO TABS: 5.0000 mg | ORAL_TABLET | Freq: Every day | ORAL | 0 refills | Status: DC

## 2021-07-08 DIAGNOSIS — K449 Diaphragmatic hernia without obstruction or gangrene: Secondary | ICD-10-CM | POA: Insufficient documentation

## 2021-07-08 NOTE — Patient Instructions (Addendum)
I recommend getting the new shingles vaccine (Shingrix). Since you have Medicare, you will need to get this from the pharmacy, as it is covered by Part D. ?This is a series of 2 injections, spaced 2 months apart.   ?This should be separated from other vaccines by at least 2 weeks. ? ?As of 04/13/2021, there is no longer any out-of-pocket expense when getting this vaccine from the pharmacy. ? ?We discussed you trying the rosuvastatin (Crestor) every day, rather than every other day.  If you find your pain is much worse with it everyday, please let us know, and cut back to every other day again. ? ?Let us know what the dose of vitamin D3 is that you are taking.  I know you are taking 1 day, but I'd like to know the dose (1000 or 2000). ? ?Try and limit sweet tea, sugar and carbs in your diet.  I'd love for you to lose a little weight at your waist. ?
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
## 2021-07-09 ENCOUNTER — Ambulatory Visit: Payer: Medicare Other | Admitting: Family Medicine

## 2021-07-09 ENCOUNTER — Encounter: Payer: Self-pay | Admitting: Family Medicine

## 2021-07-09 ENCOUNTER — Other Ambulatory Visit: Payer: Self-pay

## 2021-07-09 VITALS — BP 130/70 | HR 60 | Ht 68.0 in | Wt 165.4 lb

## 2021-07-09 DIAGNOSIS — I77810 Thoracic aortic ectasia: Secondary | ICD-10-CM | POA: Diagnosis not present

## 2021-07-09 DIAGNOSIS — J45990 Exercise induced bronchospasm: Secondary | ICD-10-CM

## 2021-07-09 DIAGNOSIS — I10 Essential (primary) hypertension: Secondary | ICD-10-CM

## 2021-07-09 DIAGNOSIS — D692 Other nonthrombocytopenic purpura: Secondary | ICD-10-CM

## 2021-07-09 DIAGNOSIS — Z5181 Encounter for therapeutic drug level monitoring: Secondary | ICD-10-CM

## 2021-07-09 DIAGNOSIS — Z7185 Encounter for immunization safety counseling: Secondary | ICD-10-CM

## 2021-07-09 DIAGNOSIS — E559 Vitamin D deficiency, unspecified: Secondary | ICD-10-CM | POA: Diagnosis not present

## 2021-07-09 DIAGNOSIS — K200 Eosinophilic esophagitis: Secondary | ICD-10-CM

## 2021-07-09 DIAGNOSIS — K449 Diaphragmatic hernia without obstruction or gangrene: Secondary | ICD-10-CM

## 2021-07-09 DIAGNOSIS — I700 Atherosclerosis of aorta: Secondary | ICD-10-CM | POA: Diagnosis not present

## 2021-07-09 DIAGNOSIS — J309 Allergic rhinitis, unspecified: Secondary | ICD-10-CM

## 2021-07-09 DIAGNOSIS — Z125 Encounter for screening for malignant neoplasm of prostate: Secondary | ICD-10-CM

## 2021-07-09 LAB — COMPREHENSIVE METABOLIC PANEL
ALT: 21 IU/L (ref 0–44)
AST: 36 IU/L (ref 0–40)
Albumin/Globulin Ratio: 1.8 (ref 1.2–2.2)
Albumin: 4.2 g/dL (ref 3.7–4.7)
Alkaline Phosphatase: 105 IU/L (ref 44–121)
BUN/Creatinine Ratio: 13 (ref 10–24)
BUN: 10 mg/dL (ref 8–27)
Bilirubin Total: 0.3 mg/dL (ref 0.0–1.2)
CO2: 23 mmol/L (ref 20–29)
Calcium: 9.1 mg/dL (ref 8.6–10.2)
Chloride: 91 mmol/L — ABNORMAL LOW (ref 96–106)
Creatinine, Ser: 0.8 mg/dL (ref 0.76–1.27)
Globulin, Total: 2.4 g/dL (ref 1.5–4.5)
Glucose: 81 mg/dL (ref 70–99)
Potassium: 5.2 mmol/L (ref 3.5–5.2)
Sodium: 126 mmol/L — ABNORMAL LOW (ref 134–144)
Total Protein: 6.6 g/dL (ref 6.0–8.5)
eGFR: 95 mL/min/{1.73_m2} (ref >59)

## 2021-07-09 NOTE — Telephone Encounter (Signed)
Please update his med list to one 2000 IU daily ?

## 2021-07-10 ENCOUNTER — Encounter: Payer: Self-pay | Admitting: *Deleted

## 2021-08-11 ENCOUNTER — Ambulatory Visit: Payer: Medicare Other | Admitting: Family Medicine

## 2021-08-11 ENCOUNTER — Encounter: Payer: Self-pay | Admitting: Family Medicine

## 2021-08-11 VITALS — BP 132/80 | HR 60 | Ht 68.0 in | Wt 167.8 lb

## 2021-08-11 DIAGNOSIS — I10 Essential (primary) hypertension: Secondary | ICD-10-CM | POA: Diagnosis not present

## 2021-08-11 NOTE — Patient Instructions (Signed)
?  Please plan ahead when eating at [employer].  ?We demonstrated that many of the foods have a fair amount of sodium, which all add up.  I'm sure they have some that are lower in salt content. ? ?Please read the attached information as well. ? ?Goal is to keep your total daily intake of sodium to under '2000mg'$ . ? ? ?

## 2021-08-11 NOTE — Progress Notes (Signed)
Chief Complaint  ?Patient presents with  ? Hypertension  ?  BP follow up, needs cleared for aviation CPE.   ? 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 ?BP was checked immediately, 157/78 (or something like that).  ?He was told it was okay, but then they called him on the way home and told him it wasn't good enough, needs to be <155.  Apparently the provider was new at doing the aviation physicals, wasn't aware of the guidelines (until after the patient left), so BP wasn't repeated later during the visit. ?He was told he go could back on Wednesday, or could be seen here and send an email to the aviation doctor stating that his blood pressure is managed/under control.  ? ?Yesterday he checked his BP multiple times and it remained 155 or higher. ?It was 138/69 at home this morning.   ?We verified accuracy of his monitor at his March visit (we had 130/70, he got 135/64). ?At recent visit, noted 130's/60's at home. ?Compliant with losartan '100mg'$  daily.   ? ?BP Readings from Last 3 Encounters:  ?08/11/21 132/80  ?07/09/21 130/70  ?04/09/21 134/68  ? ? ?He tries to limit the sodium in his diet.  Today he reports that he eats lunch 6 days a week at [employer].  He has been eating mashed potatoes or yams, lima beans, broccoli. Eats unsalted nuts at home.  They cook dinner at home, rinsing any canned foods. ? ? ?PMH, PSH, SH reviewed ? ?Outpatient Encounter Medications as of 08/11/2021  ?Medication Sig Note  ? Cholecalciferol (VITAMIN D) 50 MCG (2000 UT) CAPS Take 1 capsule by mouth daily.   ? Coenzyme Q10 (COQ10) 100 MG CAPS Take by mouth.   ? fish oil-omega-3 fatty acids 1000 MG capsule Take 1 g by mouth daily.   ? losartan (COZAAR) 100 MG tablet Take 1 tablet (100 mg total) by mouth daily.   ? omeprazole (PRILOSEC) 40 MG capsule Take 1 capsule (40 mg total) by mouth daily.   ? Pyridoxine HCl (VITAMIN B-6) 500 MG tablet Take 500 mg by mouth daily.   ? rosuvastatin (CRESTOR) 5 MG tablet Take 1  tablet (5 mg total) by mouth daily. (Patient taking differently: Take 5 mg by mouth every other day.) 08/11/2021: daily  ? vitamin B-12 (CYANOCOBALAMIN) 1000 MCG tablet Take 1,000 mcg by mouth daily.   ? vitamin C (ASCORBIC ACID) 500 MG tablet Take 500 mg by mouth daily.   ? albuterol (VENTOLIN HFA) 108 (90 Base) MCG/ACT inhaler Inhale 2 puffs into the lungs every 6 (six) hours as needed for wheezing. Use 30 mins prior to exercise (Patient not taking: Reported on 07/09/2021)   ? ?No facility-administered encounter medications on file as of 08/11/2021.  ? ?No Known Allergies ? ?ROS: no fever, chills, URI symptoms, headaches, dizziness, chest pain, GI complaints or any other concerns. ? ? ?PHYSICAL EXAM: ? ?BP 132/80   Pulse 60   Ht '5\' 8"'$  (1.727 m)   Wt 167 lb 12.8 oz (76.1 kg)   BMI 25.51 kg/m?  ? ?152/80 on LA by MD (same arm as nurse), but when on the exam table, feet dangling. ?Repeat by nurse later was 144/74. ? ?Well appearing male in no distress ?HEENT: conjunctiva and sclera are clear, EOMI ?Heart: regular rate and rhythm ?Lungs: clear bilaterally ?Extremities: no edema ? ? ?ASSESSMENT/PLAN: ? ?Essential hypertension - recent elevation at aviation PE.  Normal upon arrival here, and per visit last month,  with verified accurate monitor. Low Na diet reviewed in detail.  No change ? ?Reviewed [employer] nutritional information for the foods he is eating, and he realizes that these meals are likely over 1000 mg of Sodium. ?Discussed either eating out less (and having more control over sodium at home), vs he can look at the nutritional information himself and choose items lower in sodium. ?Given wide fluctuations in his BP's, I suspect some is related to his sodium intake, so prefer not to add another BP med at this time.  I'd rather him eat a little healthier (lower sodium). ? ?Letter written, to be emailed to aviation doc at: ?[email or web address] ? ?I spent 35 minutes dedicated to the care of this patient, including  pre-visit review of records, face to face time, counseling, documentation and writing letter. ? ? ?

## 2021-08-12 ENCOUNTER — Telehealth: Payer: Self-pay | Admitting: Family Medicine

## 2021-08-12 NOTE — Telephone Encounter (Signed)
Dr Tomi Bamberger, this come back from the email, I wasn't for sure if it come back through the letter que so I wanted to send a telephone call,  ? ?Dr Tomi Bamberger ?Thank you for the quick response regarding Donie?s HTN ?If you could provide 2 other pieces of information please  ?1 how long has it been stable ?2 presence or absence of any side effects from the losartan  ?Thank you  ?Lenora Boys MD [facility] ?

## 2021-08-12 NOTE — Telephone Encounter (Signed)
I sent a reply back through the letter queue; please forward it via email. ?Some day you will have to teach me how to do that! ?

## 2021-08-13 ENCOUNTER — Encounter: Payer: Self-pay | Admitting: Family Medicine

## 2021-08-20 ENCOUNTER — Other Ambulatory Visit: Payer: Self-pay | Admitting: Family Medicine

## 2021-10-06 ENCOUNTER — Other Ambulatory Visit: Payer: Self-pay | Admitting: Family Medicine

## 2021-10-06 DIAGNOSIS — I700 Atherosclerosis of aorta: Secondary | ICD-10-CM

## 2021-10-06 MED ORDER — ROSUVASTATIN CALCIUM 5 MG PO TABS: 5.0000 mg | ORAL_TABLET | Freq: Every day | ORAL | 0 refills | Status: DC

## 2021-11-03 ENCOUNTER — Ambulatory Visit: Payer: Medicare Other | Admitting: Family Medicine

## 2021-11-03 ENCOUNTER — Encounter: Payer: Self-pay | Admitting: Family Medicine

## 2021-11-03 VITALS — BP 130/74 | HR 56 | Temp 96.6°F | Ht 68.0 in | Wt 165.0 lb

## 2021-11-03 DIAGNOSIS — I10 Essential (primary) hypertension: Secondary | ICD-10-CM

## 2021-11-03 DIAGNOSIS — J069 Acute upper respiratory infection, unspecified: Secondary | ICD-10-CM

## 2021-11-03 NOTE — Progress Notes (Signed)
Chief Complaint  Patient presents with   Sore Throat    Started w/ST on 7/15. ST is gone. Has lingering congestion, slight cough. Mucus was bad tasting in the beginning and discolored-now is a little bit better. No fever ir chills at all. Just tired and feels bad.    Started with ST 7/15, then congested, fatigued (notes that he doesn't have the same stamina on the exercise machines at the gym). He feels better now, but still has PND, voice is a little hoarse, ears are clogged.  Phlegm is now mostly clear (originally it was dark yellow, tasted bad), no longer has bad taste. Nose is mainly stopped up.  When he blows, it is clear. He coughs some, feels rattling in chest--this has improved. Denies any wheezing, hasn't used albuterol.  Hasn't used any OTC meds other than an "airborne" tablet in his orange juice.  He has h/o RAD/asthma (usually only has problems around Christmas). He noted at his CPE that he was slower on the bike, and that he was more worn out by the 9th hole of golf.  Today he reports noticing he has less "wind-power" on the cardio machines, can't hit the golf ball as far, and is worse a tennis. He feels this has been since he had COVID. No acute worsening or change, just notices a different over the last year or two.  He reports today that his BP's have been better. He switched to oatmeal, watching his sodium carefully. BP was 109/50's this morning. Denies dizziness.   PMH, PSH, SH reviewed  Outpatient Encounter Medications as of 11/03/2021  Medication Sig   Cholecalciferol (VITAMIN D) 50 MCG (2000 UT) CAPS Take 1 capsule by mouth daily.   Coenzyme Q10 (COQ10) 100 MG CAPS Take by mouth.   fish oil-omega-3 fatty acids 1000 MG capsule Take 1 g by mouth daily.   losartan (COZAAR) 100 MG tablet TAKE 1 TABLET(100 MG) BY MOUTH DAILY   omeprazole (PRILOSEC) 40 MG capsule Take 1 capsule (40 mg total) by mouth daily.   Pyridoxine HCl (VITAMIN B-6) 500 MG tablet Take 500 mg by  mouth daily.   rosuvastatin (CRESTOR) 5 MG tablet Take 1 tablet (5 mg total) by mouth daily.   vitamin B-12 (CYANOCOBALAMIN) 1000 MCG tablet Take 1,000 mcg by mouth daily.   vitamin C (ASCORBIC ACID) 500 MG tablet Take 500 mg by mouth daily.   albuterol (VENTOLIN HFA) 108 (90 Base) MCG/ACT inhaler Inhale 2 puffs into the lungs every 6 (six) hours as needed for wheezing. Use 30 mins prior to exercise (Patient not taking: Reported on 07/09/2021)   No facility-administered encounter medications on file as of 11/03/2021.   No Known Allergies  ROS:  No fever, chills, n/v/d, myalgias, rashes. +fatigue and URI symptoms per HPI. No headaches, dizziness, chest pain, shortness of breath.\   PHYSICAL EXAM:  BP 130/74   Pulse (!) 56   Temp (!) 96.6 F (35.9 C) (Tympanic)   Ht '5\' 8"'$  (1.727 m)   Wt 165 lb (74.8 kg)   BMI 25.09 kg/m   Well-appearing male in no distress. Speaking comfortably, slightly hoarse voice. No coughing, sniffling or throat-clearing during visit. HEENT: conjunctiva and sclera are clear, EOMI.  Nasal mucosa with mild-mod mucosal congestion, no erythema or purulence. Sinuses are nontender.  TM's and EAC's normal Neck: no lymphadenopathy or mass Heart: regular rate and rhythm, no murmur Lungs clear bilaterally, no wheezes, rales, ronchi Extremities: no edema Skin: normal turgor, no visible rash Neuro: alert  and oriented, cranial nerves grossly intact. Psych: normal mood, affect, hygiene and grooming.   ASSESSMENT/PLAN:  Viral upper respiratory illness - Seems to be improving on its own, suspect viral. Suportive measures reviewed--sinus rinses, mucinex. s/s sinusitis reviewed, contact us if worsening  Essential hypertension - BP's improved. Cont current meds, low Na diet  Pt concerned about his decline in exercise tolerance, asking about CXR.  Reviewed lack of pulmonary findings on CT 12/2020 which reassured him.   Continue to drink plenty of water. Continue sinus  rinses daily until this is better. I recommend using mucinex 12 hour (plain) twice daily, to help keep the phlegm loose. If you develop sinus pain or recurrent discolored mucus or phlegm (in the next week or two), contact us for an antibiotic.

## 2021-11-03 NOTE — Patient Instructions (Addendum)
Continue to drink plenty of water. Continue sinus rinses daily until this is better. I recommend using mucinex 12 hour (plain) twice daily, to help keep the phlegm loose. If you develop sinus pain or recurrent discolored mucus or phlegm (in the next week or two), contact us for an antibiotic.  Remember you can use the albuterol if you're getting short of breath. Try and avoid outdoor exercise when the air quality is bad (related to heat, and the [place] fires).

## 2021-11-18 ENCOUNTER — Other Ambulatory Visit: Payer: Self-pay

## 2021-11-18 ENCOUNTER — Other Ambulatory Visit: Payer: Self-pay | Admitting: Family Medicine

## 2021-11-18 DIAGNOSIS — K2970 Gastritis, unspecified, without bleeding: Secondary | ICD-10-CM

## 2021-11-18 MED ORDER — LOSARTAN POTASSIUM 100 MG PO TABS: ORAL_TABLET | ORAL | 0 refills | Status: DC

## 2021-11-18 NOTE — Telephone Encounter (Signed)
Lvm for pt to call back to advise if he has enough to last until his appointment

## 2021-11-18 NOTE — Telephone Encounter (Signed)
Pt states he does not have enough until next appt but also he is requesting medication for his cough that wont go away.

## 2021-11-20 ENCOUNTER — Observation Stay (HOSPITAL_COMMUNITY)
Admission: EM | Admit: 2021-11-20 | Discharge: 2021-11-21 | Disposition: A | Discharge status: Home / Self Care | Payer: Medicare Other | Attending: Internal Medicine | Admitting: Internal Medicine

## 2021-11-20 ENCOUNTER — Other Ambulatory Visit: Payer: Self-pay

## 2021-11-20 ENCOUNTER — Emergency Department (HOSPITAL_COMMUNITY): Payer: Medicare Other

## 2021-11-20 ENCOUNTER — Encounter (HOSPITAL_COMMUNITY): Payer: Self-pay | Admitting: Emergency Medicine

## 2021-11-20 DIAGNOSIS — Z79899 Other long term (current) drug therapy: Secondary | ICD-10-CM | POA: Insufficient documentation

## 2021-11-20 DIAGNOSIS — N138 Other obstructive and reflux uropathy: Secondary | ICD-10-CM

## 2021-11-20 DIAGNOSIS — I10 Essential (primary) hypertension: Secondary | ICD-10-CM | POA: Diagnosis not present

## 2021-11-20 DIAGNOSIS — N401 Enlarged prostate with lower urinary tract symptoms: Secondary | ICD-10-CM | POA: Diagnosis present

## 2021-11-20 DIAGNOSIS — J45990 Exercise induced bronchospasm: Secondary | ICD-10-CM | POA: Diagnosis present

## 2021-11-20 DIAGNOSIS — K200 Eosinophilic esophagitis: Secondary | ICD-10-CM | POA: Diagnosis not present

## 2021-11-20 DIAGNOSIS — R55 Syncope and collapse: Secondary | ICD-10-CM | POA: Diagnosis not present

## 2021-11-20 DIAGNOSIS — R778 Other specified abnormalities of plasma proteins: Secondary | ICD-10-CM | POA: Insufficient documentation

## 2021-11-20 DIAGNOSIS — Z85828 Personal history of other malignant neoplasm of skin: Secondary | ICD-10-CM | POA: Diagnosis not present

## 2021-11-20 DIAGNOSIS — E871 Hypo-osmolality and hyponatremia: Secondary | ICD-10-CM | POA: Diagnosis not present

## 2021-11-20 DIAGNOSIS — D649 Anemia, unspecified: Secondary | ICD-10-CM | POA: Insufficient documentation

## 2021-11-20 LAB — COMPREHENSIVE METABOLIC PANEL
ALT: 22 U/L (ref 0–44)
AST: 42 U/L — ABNORMAL HIGH (ref 15–41)
Albumin: 3.7 g/dL (ref 3.5–5.0)
Alkaline Phosphatase: 75 U/L (ref 38–126)
Anion gap: 9 (ref 5–15)
BUN: 12 mg/dL (ref 8–23)
CO2: 23 mmol/L (ref 22–32)
Calcium: 9 mg/dL (ref 8.9–10.3)
Chloride: 93 mmol/L — ABNORMAL LOW (ref 98–111)
Creatinine, Ser: 0.89 mg/dL (ref 0.61–1.24)
GFR, Estimated: >60 mL/min (ref >60)
Glucose, Bld: 108 mg/dL — ABNORMAL HIGH (ref 70–99)
Potassium: 4 mmol/L (ref 3.5–5.1)
Sodium: 125 mmol/L — ABNORMAL LOW (ref 135–145)
Total Bilirubin: 0.5 mg/dL (ref 0.3–1.2)
Total Protein: 6.4 g/dL — ABNORMAL LOW (ref 6.5–8.1)

## 2021-11-20 LAB — POC OCCULT BLOOD, ED: Fecal Occult Bld: NEGATIVE

## 2021-11-20 LAB — CBC
HCT: 30.6 % — ABNORMAL LOW (ref 39.0–52.0)
Hemoglobin: 10.9 g/dL — ABNORMAL LOW (ref 13.0–17.0)
MCH: 32.3 pg (ref 26.0–34.0)
MCHC: 35.6 g/dL (ref 30.0–36.0)
MCV: 90.8 fL (ref 80.0–100.0)
Platelets: 204 10*3/uL (ref 150–400)
RBC: 3.37 MIL/uL — ABNORMAL LOW (ref 4.22–5.81)
RDW: 13.3 % (ref 11.5–15.5)
WBC: 7.6 10*3/uL (ref 4.0–10.5)
nRBC: 0 % (ref 0.0–0.2)

## 2021-11-20 LAB — TROPONIN I (HIGH SENSITIVITY)
Troponin I (High Sensitivity): 22 ng/L — ABNORMAL HIGH (ref <18)
Troponin I (High Sensitivity): 18 ng/L — ABNORMAL HIGH (ref <18)

## 2021-11-20 LAB — LIPASE, BLOOD: Lipase: 39 U/L (ref 11–51)

## 2021-11-20 MED ORDER — ACETAMINOPHEN 325 MG PO TABS: 650.0000 mg | ORAL_TABLET | Freq: Four times a day (QID) | ORAL | Status: DC | PRN

## 2021-11-20 MED ORDER — LOSARTAN POTASSIUM 50 MG PO TABS
100.0000 mg | ORAL_TABLET | Freq: Every day | ORAL | Status: DC
  Administered 2021-11-21: 100 mg via ORAL
  Filled 2021-11-20: qty 2

## 2021-11-20 MED ORDER — ALBUTEROL SULFATE (2.5 MG/3ML) 0.083% IN NEBU: 2.5000 mg | INHALATION_SOLUTION | Freq: Four times a day (QID) | RESPIRATORY_TRACT | Status: DC | PRN

## 2021-11-20 MED ORDER — PANTOPRAZOLE SODIUM 40 MG PO TBEC
40.0000 mg | DELAYED_RELEASE_TABLET | Freq: Every day | ORAL | Status: DC
  Administered 2021-11-21: 40 mg via ORAL
  Filled 2021-11-20: qty 1

## 2021-11-20 MED ORDER — POLYETHYLENE GLYCOL 3350 17 G PO PACK: 17.0000 g | PACK | Freq: Every day | ORAL | Status: DC | PRN

## 2021-11-20 MED ORDER — SODIUM CHLORIDE 0.9% FLUSH
3.0000 mL | Freq: Two times a day (BID) | INTRAVENOUS | Status: DC
  Administered 2021-11-21: 3 mL via INTRAVENOUS

## 2021-11-20 MED ORDER — ENOXAPARIN SODIUM 40 MG/0.4ML IJ SOSY
40.0000 mg | PREFILLED_SYRINGE | Freq: Every day | INTRAMUSCULAR | Status: DC
  Administered 2021-11-21: 40 mg via SUBCUTANEOUS
  Filled 2021-11-20: qty 0.4

## 2021-11-20 MED ORDER — MIRABEGRON ER 25 MG PO TB24
25.0000 mg | ORAL_TABLET | Freq: Every day | ORAL | Status: DC
  Administered 2021-11-21: 25 mg via ORAL
  Filled 2021-11-20 (×2): qty 1

## 2021-11-20 MED ORDER — ROSUVASTATIN CALCIUM 5 MG PO TABS
5.0000 mg | ORAL_TABLET | Freq: Every day | ORAL | Status: DC
  Administered 2021-11-21: 5 mg via ORAL
  Filled 2021-11-20: qty 1

## 2021-11-20 MED ORDER — ACETAMINOPHEN 650 MG RE SUPP: 650.0000 mg | Freq: Four times a day (QID) | RECTAL | Status: DC | PRN

## 2021-11-20 MED ORDER — SODIUM CHLORIDE 0.9 % IV SOLN: INTRAVENOUS | Status: DC

## 2021-11-20 NOTE — ED Notes (Deleted)
Code Stroke activated per Dr. Gilford Raid

## 2021-11-20 NOTE — H&P (Signed)
History and Physical   SAAD BUHL TFT:[id number] DOB: Nov 03, 1950 DOA: 11/20/2021  PCP: Rita Ohara, MD   Patient coming from: Home  Chief Complaint: Near syncopal episode  HPI: Wayne Brandt is a 71 y.o. male with medical history significant of exercise-induced asthma, eosinophilic esophagitis, hypertension, BPH, polio presenting after episode of near syncope.  Patient reports having GI discomfort earlier in the day that felt like possible constipation.  He took a laxative and then ate some food.  He was sitting on the couch and had sudden onset nausea with associated lightheadedness diaphoresis.  Following this he vomited and felt better after vomiting.  During this episode of lightheadedness he did not collapse but his wife states that he was not communicating normally and his responsiveness was decreased. He states that he felt like he was going to black out.  EMS was called and initially noted some abnormal EKG changes and code STEMI was called.  This was canceled by cardiology upon review of patient's rhythm.  Patient reports ongoing cough.  Denies fevers, chills, chest pain, shortness of breath.  ED Course: Vital signs in the ED significant for heart rate in the 50s to 60s, blood pressure in the 706C to 376E systolic.  Lab workup included CMP with sodium 125, chloride 93, glucose 106, protein 6.4, AST 42.  CBC with hemoglobin of 10.9 down from previous baseline of 12-13.  Troponin flat at 22 and 18 on repeat.  Lipase normal.  FOBT negative.  Chest x-ray without acute abnormality.  As above cardiology cleared patient from questionable STEMI in ED and troponins confirm this.  Review of Systems: As per HPI otherwise all other systems reviewed and are negative.  Past Medical History:  Diagnosis Date   Actinic keratosis    Dr. Renda Rolls   Arthritis    wrists, L thumb   BPH (benign prostatic hyperplasia)    ED (erectile dysfunction)    Family history of premature CAD    GERD  (gastroesophageal reflux disease)    HH (hiatus hernia)    History of basal cell carcinoma excision    2013-- chest area   History of esophagogastroduodenoscopy (EGD) 12/06/2017   Dr.Medoff-distal esophageal stricture,hiatal hernia,gastritis and duodenitis.   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   S/P dilatation of esophageal stricture    multiple times   SCC (squamous cell carcinoma) 2020   face   Urinary retention     Past Surgical History:  Procedure Laterality Date   ANKLE FUSION Left 1997   BAND HEMORRHOIDECTOMY     Dr. Earlean Shawl   CARDIOVASCULAR STRESS TEST  2006   normal--Dr. Percival Spanish   CATARACT EXTRACTION Right 04/08/2020   CATARACT EXTRACTION Left 05/20/2020   COLONOSCOPY, ESOPHAGOGASTRODUODENOSCOPY (EGD) AND ESOPHAGEAL DILATION  02/15/2014   Dr.Medoff   ESOPHAGEAL DILATION  02/08/2017   Dr. Earlean Shawl.  EGD showed stricture and HH   ESOPHAGOGASTRODUODENOSCOPY  02/04/2017   ESOPHAGOGASTRODUODENOSCOPY ENDOSCOPY  02/08/2017   I & D RIGHT KNEE W/ REMOVAL PATELLA WIRES  02/09/2007   KNEE ARTHROSCOPY Right 07/07/2000   post-op  07-09-2000  right knee arthroscopy w/ lavage and debridement hemathrosis/ pyathrosis   LEFT FOOT SURGERY  x2  as child   polio   NEUROPLASTY / TRANSPOSITION ULNAR NERVE AT ELBOW Right 01/04/2008   ORIF RIGHT PERIPROSTHEITC PATELLA FX  11/01/2006  REVERSE SHOULDER ARTHROPLASTY Right 03/23/2019   Procedure: REVERSE SHOULDER ARTHROPLASTY;  Surgeon: Justice Britain, MD;  Location: [facility];  Service: Orthopedics;  Laterality: Right;  129mn   TOTAL KNEE ARTHROPLASTY Right 02/22/2006   TRANSURETHRAL RESECTION OF PROSTATE N/A 06/08/2016   Procedure: TRANSURETHRAL RESECTION OF THE PROSTATE (TURP);  Surgeon: MKathie Rhodes MD;  [facility]  Service: Urology;  Laterality: N/A;   UPPER GI ENDOSCOPY  11/21/2018   hiatal hernia,  gastritis, duodenitis   WRIST ARTHROSCOPY WITH DEBRIDEMENT Right 03/30/2001    Social History  reports that he has never smoked. He has never used smokeless tobacco. He reports current alcohol use. He reports that he does not use drugs.  No Known Allergies  Family History  Problem Relation Age of Onset   Alcohol abuse Mother    Cancer Mother        male cancer in 49's  Heart disease Father 447      MI   Heart disease Brother 361      MI   Glaucoma Brother    Heart disease Brother 426      stents   Hypertension Son    Diabetes Neg Hx    Colon cancer Neg Hx   Reviewed on admission  Prior to Admission medications   Medication Sig Start Date End Date Taking? Authorizing Provider  Cholecalciferol (VITAMIN D) 50 MCG (2000 UT) CAPS Take 1 capsule by mouth daily.   Yes [provider]  Coenzyme Q10 (COQ10) 100 MG CAPS Take 1 capsule by mouth daily.   Yes [provider]  fish oil-omega-3 fatty acids 1000 MG capsule Take 1 g by mouth daily.   Yes [provider]  guaiFENesin (MUCINEX) 600 MG 12 hr tablet Take 600 mg by mouth 2 (two) times daily as needed for cough or to loosen phlegm.   Yes [provider]  losartan (COZAAR) 100 MG tablet TAKE 1 TABLET(100 MG) BY MOUTH DAILY 11/18/21  Yes KRita Ohara MD  Mirabegron (MYRBETRIQ PO) Take 1 tablet by mouth at bedtime. Sample Pack   Yes [provider]  omeprazole (PRILOSEC) 40 MG capsule TAKE 1 CAPSULE(40 MG) BY MOUTH DAILY 11/18/21  Yes KRita Ohara MD  Pyridoxine HCl (VITAMIN B-6) 500 MG tablet Take 500 mg by mouth daily.   Yes [provider]  rosuvastatin (CRESTOR) 5 MG tablet Take 1 tablet (5 mg total) by mouth daily. 10/06/21  Yes KRita Ohara MD  vitamin B-12 (CYANOCOBALAMIN) 1000 MCG tablet Take 1,000 mcg by mouth daily.   Yes [provider]  vitamin C (ASCORBIC ACID) 500 MG tablet Take 500 mg by mouth daily.   Yes [provider]  albuterol (VENTOLIN HFA) 108 (90  Base) MCG/ACT inhaler Inhale 2 puffs into the lungs every 6 (six) hours as needed for wheezing. Use 30 mins prior to exercise Patient not taking: Reported on 07/09/2021 03/31/19   KRita Ohara MD    Physical Exam: Vitals:   11/20/21 2130 11/20/21 2200 11/20/21 2215 11/20/21 2230  BP: 133/73 134/76 (!) 141/79 (!) 141/79  Pulse: (!) 55 (!) 55 (!) 55 60  Resp: '12 13 16 15  '$ Temp:      TempSrc:      SpO2: 98% 99% 97% 99%  Weight:      Height:        Physical Exam Constitutional:      General: He is not in acute distress.    Appearance: Normal  appearance.  HENT:     Head: Normocephalic and atraumatic.     Mouth/Throat:     Mouth: Mucous membranes are moist.     Pharynx: Oropharynx is clear.  Eyes:     Extraocular Movements: Extraocular movements intact.     Pupils: Pupils are equal, round, and reactive to light.  Cardiovascular:     Rate and Rhythm: Normal rate and regular rhythm.     Pulses: Normal pulses.     Heart sounds: Normal heart sounds.  Pulmonary:     Effort: Pulmonary effort is normal. No respiratory distress.     Breath sounds: Normal breath sounds.  Abdominal:     General: Bowel sounds are normal. There is no distension.     Palpations: Abdomen is soft.     Tenderness: There is no abdominal tenderness.  Musculoskeletal:        General: No swelling or deformity.  Skin:    General: Skin is warm and dry.  Neurological:     General: No focal deficit present.     Mental Status: Mental status is at baseline.    Labs on Admission: I have personally reviewed following labs and imaging studies  CBC: Recent Labs  Lab 11/20/21 1950  WBC 7.6  HGB 10.9*  HCT 30.6*  MCV 90.8  PLT 027    Basic Metabolic Panel: Recent Labs  Lab 11/20/21 1950  NA 125*  K 4.0  CL 93*  CO2 23  GLUCOSE 108*  BUN 12  CREATININE 0.89  CALCIUM 9.0    GFR: Estimated Creatinine Clearance: 72.6 mL/min (by C-G formula based on SCr of 0.89 mg/dL).  Liver Function Tests: Recent  Labs  Lab 11/20/21 1950  AST 42*  ALT 22  ALKPHOS 75  BILITOT 0.5  PROT 6.4*  ALBUMIN 3.7    Urine analysis:    Component Value Date/Time   COLORURINE YELLOW 02/08/2007 0145   APPEARANCEUR CLEAR 02/08/2007 0145   LABSPEC 1.015 07/21/2017 1008   PHURINE 7.0 02/08/2007 0145   GLUCOSEU NEGATIVE 02/08/2007 0145   HGBUR NEGATIVE 02/08/2007 0145   BILIRUBINUR negative 07/21/2017 1008   BILIRUBINUR neg 07/09/2016 0845   KETONESUR negative 07/21/2017 1008   KETONESUR NEGATIVE 02/08/2007 0145   PROTEINUR negative 07/21/2017 1008   PROTEINUR neg 07/09/2016 0845   PROTEINUR NEGATIVE 02/08/2007 0145   UROBILINOGEN negative 07/09/2016 0845   UROBILINOGEN 0.2 02/08/2007 0145   NITRITE Negative 07/21/2017 1008   NITRITE neg 07/09/2016 0845   NITRITE NEGATIVE 02/08/2007 0145   LEUKOCYTESUR Negative 07/21/2017 1008    Radiological Exams on Admission: DG Chest Portable 1 View  Result Date: 11/20/2021 CLINICAL DATA:  Code STEMI EXAM: PORTABLE CHEST 1 VIEW COMPARISON:  Chest x-ray 02/21/2016 FINDINGS: The heart and mediastinal contours are unchanged. Aortic calcification. No focal consolidation. No pulmonary edema. No pleural effusion. No pneumothorax. No acute osseous abnormality. IMPRESSION: 1. No active disease. 2.  Aortic Atherosclerosis (ICD10-I70.0). Electronically Signed   By: Iven Finn M.D.   On: 11/20/2021 20:31    EKG: Independently reviewed.  Sinus rhythm 53 bpm.  Early transition of R wave progression.  Borderline ST depression in multiple leads questionable ST elevation in anterior leads this appears to be more likely J-point elevation.   Assessment/Plan Principal Problem:   Near syncope Active Problems:   Exercise-induced asthma   Eosinophilic esophagitis   Essential hypertension   BPH with urinary obstruction   Near syncope > Patient presenting after an episode ofA of lightheadedness, nausea, diaphoresis ultimately  resulting in vomiting and then feeling better.   During this episode wife reports he had decreased responsiveness  > Patient did not actually collapse.  Appears to be near syncopal episode suspicious for possible vasovagal etiology, unclear trigger however he did feel constipated earlier in the day but not triggered during bowel movement. - Monitor on telemetry - Check orthostatic vital signs - Echocardiogram - Supportive care  Hyponatremia > Patient noted incidentally to have sodium of 125 in the ED.  Unclear etiology possibly hypovolemic, does appear to be chronic with values in the 125-126 rand for 1-2 years - Will start work up and attempt correction. He states a previous provider felt he drank too much fluids. - Monitor on telemetry - Trend BMP - Normal saline at 100 cc an hour - Check serum osmolality, urine osmolality, urine sodium    Eosinophilic esophagitis - Continue home PPI  Hypertension - Continue home losartan  BPH - Continue home Myrbetriq  DVT prophylaxis: Lovenox Code Status:   Full Family Communication:  None on admission, had been with him earlier  Disposition Plan:   Patient is from:  Home  Anticipated DC to:  Home  Anticipated DC date:  1 to 2 days  Anticipated DC barriers: None  Consults called:  None (given initially STEMI was called had been cleared by cardiology based on EKG but not consulting) Admission status:  Observation, telemetry  Severity of Illness: The appropriate patient status for this patient is OBSERVATION. Observation status is judged to be reasonable and necessary in order to provide the required intensity of service to ensure the patient's safety. The patient's presenting symptoms, physical exam findings, and initial radiographic and laboratory data in the context of their medical condition is felt to place them at decreased risk for further clinical deterioration. Furthermore, it is anticipated that the patient will be medically stable for discharge from the hospital within 2 midnights  of admission.    Marcelyn Bruins MD [facility]  How to contact the [facility] Attending or Consulting provider [place] or covering provider during after hours [place], for this patient?   Check the care team in [facility] and look for a) attending/consulting [facility] provider listed and b) the [facility] team listed Log into [email or web address] and use [place]'s universal password to access. If you do not have the password, please contact the hospital operator. Locate the [facility] provider you are looking for under [facility] and page to a number that you can be directly reached. If you still have difficulty reaching the provider, please page the [facility] (Director on Call) for the Hospitalists listed on amion for assistance.  11/20/2021, 11:14 PM

## 2021-11-20 NOTE — ED Provider Notes (Signed)
[facility] EMERGENCY DEPARTMENT Provider Note   CSN: [id number] Arrival date & time: 11/20/21  1943     History  Chief Complaint  Patient presents with   Code STEMI   Near Syncope    Wayne Brandt is a 71 y.o. male.  HPI   Patient has history of hiatal hernia arthritis, hypertension squamous cell carcinoma who presents to the ED for evaluation of a near syncopal episode.  Patient states he had some mild GI discomfort earlier today.  He felt like he might be constipated.  He took a laxative and ate some food.  He was sitting on the couch when he suddenly became nauseated and felt lightheaded.  He became diaphoretic.  Patient ended up vomiting at home.  Patient started to feel much better after the emesis.  EMS was called.  They did an EKG and it had some abnormal findings.  Code STEMI was activated.  Patient denies having any chest pain or shortness of breath.  He states he feels fine at this time and much better than he did earlier. He denies any trouble with abdominal pain.  He has not noticed any rectal bleeding.  He has had a bit of a chronic cough but that is been ongoing for months.  Nothing new.  No fevers. Home Medications Prior to Admission medications   Medication Sig Start Date End Date Taking? Authorizing Provider  Cholecalciferol (VITAMIN D) 50 MCG (2000 UT) CAPS Take 1 capsule by mouth daily.   Yes [provider]  Coenzyme Q10 (COQ10) 100 MG CAPS Take 1 capsule by mouth daily.   Yes [provider]  fish oil-omega-3 fatty acids 1000 MG capsule Take 1 g by mouth daily.   Yes [provider]  guaiFENesin (MUCINEX) 600 MG 12 hr tablet Take 600 mg by mouth 2 (two) times daily as needed for cough or to loosen phlegm.   Yes [provider]  losartan (COZAAR) 100 MG tablet TAKE 1 TABLET(100 MG) BY MOUTH DAILY 11/18/21  Yes Rita Ohara, MD  Mirabegron (MYRBETRIQ PO) Take 1 tablet by mouth at bedtime. Sample Pack   Yes  [provider]  omeprazole (PRILOSEC) 40 MG capsule TAKE 1 CAPSULE(40 MG) BY MOUTH DAILY 11/18/21  Yes Rita Ohara, MD  Pyridoxine HCl (VITAMIN B-6) 500 MG tablet Take 500 mg by mouth daily.   Yes [provider]  rosuvastatin (CRESTOR) 5 MG tablet Take 1 tablet (5 mg total) by mouth daily. 10/06/21  Yes Rita Ohara, MD  vitamin B-12 (CYANOCOBALAMIN) 1000 MCG tablet Take 1,000 mcg by mouth daily.   Yes [provider]  vitamin C (ASCORBIC ACID) 500 MG tablet Take 500 mg by mouth daily.   Yes [provider]  albuterol (VENTOLIN HFA) 108 (90 Base) MCG/ACT inhaler Inhale 2 puffs into the lungs every 6 (six) hours as needed for wheezing. Use 30 mins prior to exercise Patient not taking: Reported on 07/09/2021 03/31/19   Rita Ohara, MD      Allergies    Patient has no known allergies.    Review of Systems   Review of Systems  Physical Exam Updated Vital Signs BP (!) 141/79   Pulse 60   Temp 97.6 F (36.4 C) (Oral)   Resp 15   Ht 1.727 m ('5\' 8"'$ )   Wt 73.5 kg   SpO2 99%   BMI 24.63 kg/m  Physical Exam Vitals and nursing note reviewed.  Constitutional:      General:  He is not in acute distress.    Appearance: He is well-developed.  HENT:     Head: Normocephalic and atraumatic.     Right Ear: External ear normal.     Left Ear: External ear normal.  Eyes:     General: No scleral icterus.       Right eye: No discharge.        Left eye: No discharge.     Conjunctiva/sclera: Conjunctivae normal.  Neck:     Trachea: No tracheal deviation.  Cardiovascular:     Rate and Rhythm: Normal rate and regular rhythm.  Pulmonary:     Effort: Pulmonary effort is normal. No respiratory distress.     Breath sounds: Normal breath sounds. No stridor. No wheezing or rales.  Abdominal:     General: Bowel sounds are normal. There is no distension.     Palpations: Abdomen is soft.     Tenderness: There is no abdominal tenderness. There is no guarding or rebound.   Musculoskeletal:        General: No tenderness or deformity.     Cervical back: Neck supple.  Skin:    General: Skin is warm and dry.     Findings: No rash.  Neurological:     General: No focal deficit present.     Mental Status: He is alert.     Cranial Nerves: No cranial nerve deficit (no facial droop, extraocular movements intact, no slurred speech).     Sensory: No sensory deficit.     Motor: No abnormal muscle tone or seizure activity.     Coordination: Coordination normal.  Psychiatric:        Mood and Affect: Mood normal.     ED Results / Procedures / Treatments   Labs (all labs ordered are listed, but only abnormal results are displayed) Labs Reviewed  CBC - Abnormal; Notable for the following components:      Result Value   RBC 3.37 (*)    Hemoglobin 10.9 (*)    HCT 30.6 (*)    All other components within normal limits  COMPREHENSIVE METABOLIC PANEL - Abnormal; Notable for the following components:   Sodium 125 (*)    Chloride 93 (*)    Glucose, Bld 108 (*)    Total Protein 6.4 (*)    AST 42 (*)    All other components within normal limits  TROPONIN I (HIGH SENSITIVITY) - Abnormal; Notable for the following components:   Troponin I (High Sensitivity) 22 (*)    All other components within normal limits  TROPONIN I (HIGH SENSITIVITY) - Abnormal; Notable for the following components:   Troponin I (High Sensitivity) 18 (*)    All other components within normal limits  LIPASE, BLOOD  POC OCCULT BLOOD, ED    EKG EKG Interpretation  Date/Time:  Thursday November 20 2021 19:49:32 EDT Ventricular Rate:  53 PR Interval:  192 QRS Duration: 95 QT Interval:  504 QTC Calculation: 474 R Axis:   67 Text Interpretation: Sinus rhythm Abnormal R-wave progression, early transition Borderline ST depression, diffuse leads Borderline ST elevation, anterior leads ST changes noted on prior ECG Confirmed by Dorie Rank [phone]) on 11/20/2021 8:15:06 PM  Radiology DG Chest  Portable 1 View  Result Date: 11/20/2021 CLINICAL DATA:  Code STEMI EXAM: PORTABLE CHEST 1 VIEW COMPARISON:  Chest x-ray 02/21/2016 FINDINGS: The heart and mediastinal contours are unchanged. Aortic calcification. No focal consolidation. No pulmonary edema. No pleural effusion. No pneumothorax. No acute osseous  abnormality. IMPRESSION: 1. No active disease. 2.  Aortic Atherosclerosis (ICD10-I70.0). Electronically Signed   By: Iven Finn M.D.   On: 11/20/2021 20:31    Procedures Procedures    Medications Ordered in ED Medications - No data to display  ED Course/ Medical Decision Making/ A&P Clinical Course as of 11/20/21 2317  Thu Nov 20, 2021  1955 EKG reviewed with Dr. Tamala Julian.  Code STEMI deactivated. [JK]  2039 CBC(!) Hemoglobin decreased compared to previous values [JK]  2114 Comprehensive metabolic panel(!) Hyponatremia noted, similar to previous values [JK]  2114 Troponin I (High Sensitivity)(!) Initial troponin elevated at 22 [JK]  2122 POC occult blood, ED Negative [JK]  2226 Troponin I (High Sensitivity)(!) Delta troponin still slightly elevated but decreased compared to previous value [JK]  2308 Case discussed with Dr Trilby Drummer regarding admission [JK]    Clinical Course User Index [JK] Dorie Rank, MD                           Medical Decision Making Problems Addressed: Anemia, unspecified type: undiagnosed new problem with uncertain prognosis Elevated troponin: acute illness or injury that poses a threat to life or bodily functions Hyponatremia: chronic illness or injury  Amount and/or Complexity of Data Reviewed Labs: ordered. Decision-making details documented in ED Course. Radiology: ordered.  Risk Decision regarding hospitalization.   Patient presented to the ED for evaluation of a syncopal episode that occurred after nausea and vomiting.  Patient's wife states patient was unresponsive at could not answer.  EMS initially activated as a code STEMI however  patient was not having any chest pain.  EKG was reviewed by Dr. Tamala Julian and code STEMI was canceled.  Patient has remained symptom-free in the ED.  His hemoglobin is decreased compared to previous values but guaiac is negative.  No signs of active GI bleeding.Patient's troponin are slightly elevated.  No significant rise to suggest acute cardiac ischemia.  Unclear if this is a vasovagal event associated with the nausea vomiting versus cardiac dysrhythmia causing his unresponsiveness and syncope.  I will consult the medical service for admission for overnight observation, cardiac monitoring, consider echocardiogram        Final Clinical Impression(s) / ED Diagnoses Final diagnoses:  Hyponatremia  Anemia, unspecified type  Elevated troponin    Rx / DC Orders ED Discharge Orders     None         Dorie Rank, MD 11/20/21 2317

## 2021-11-20 NOTE — ED Triage Notes (Signed)
Pt bib [facility] from home as code stemi. Ems called because pt had episode of sudden vomiting and dizziness. Pt asymptomatic on arrival. Denies chest pain, denies sob, denies cardiac hx.    BP 152/83 HR 50, Spo2 97% RA

## 2021-11-20 NOTE — ED Notes (Signed)
Code stemi cancelled 1955 per Dr. Tomi Bamberger

## 2021-11-21 ENCOUNTER — Observation Stay (HOSPITAL_BASED_OUTPATIENT_CLINIC_OR_DEPARTMENT_OTHER): Payer: Medicare Other

## 2021-11-21 DIAGNOSIS — N401 Enlarged prostate with lower urinary tract symptoms: Secondary | ICD-10-CM | POA: Diagnosis not present

## 2021-11-21 DIAGNOSIS — R778 Other specified abnormalities of plasma proteins: Secondary | ICD-10-CM | POA: Diagnosis not present

## 2021-11-21 DIAGNOSIS — N138 Other obstructive and reflux uropathy: Secondary | ICD-10-CM | POA: Diagnosis not present

## 2021-11-21 DIAGNOSIS — R55 Syncope and collapse: Secondary | ICD-10-CM | POA: Diagnosis not present

## 2021-11-21 LAB — CBC
HCT: 30.2 % — ABNORMAL LOW (ref 39.0–52.0)
Hemoglobin: 10.6 g/dL — ABNORMAL LOW (ref 13.0–17.0)
MCH: 32.1 pg (ref 26.0–34.0)
MCHC: 35.1 g/dL (ref 30.0–36.0)
MCV: 91.5 fL (ref 80.0–100.0)
Platelets: 183 10*3/uL (ref 150–400)
RBC: 3.3 MIL/uL — ABNORMAL LOW (ref 4.22–5.81)
RDW: 13.3 % (ref 11.5–15.5)
WBC: 5.8 10*3/uL (ref 4.0–10.5)
nRBC: 0 % (ref 0.0–0.2)

## 2021-11-21 LAB — ECHOCARDIOGRAM COMPLETE
Area-P 1/2: 2.49 cm2
Calc EF: 66.4 %
Height: 68 in
S' Lateral: 3.5 cm
Single Plane A2C EF: 65.4 %
Single Plane A4C EF: 67.4 %
Weight: 2592 oz

## 2021-11-21 LAB — SODIUM, URINE, RANDOM: Sodium, Ur: 10 mmol/L

## 2021-11-21 LAB — BASIC METABOLIC PANEL
Anion gap: 8 (ref 5–15)
Anion gap: 5 (ref 5–15)
Anion gap: 7 (ref 5–15)
Anion gap: 4 — ABNORMAL LOW (ref 5–15)
BUN: 11 mg/dL (ref 8–23)
BUN: 12 mg/dL (ref 8–23)
BUN: 12 mg/dL (ref 8–23)
BUN: 8 mg/dL (ref 8–23)
CO2: 23 mmol/L (ref 22–32)
CO2: 25 mmol/L (ref 22–32)
CO2: 22 mmol/L (ref 22–32)
CO2: 24 mmol/L (ref 22–32)
Calcium: 8.6 mg/dL — ABNORMAL LOW (ref 8.9–10.3)
Calcium: 8.5 mg/dL — ABNORMAL LOW (ref 8.9–10.3)
Calcium: 8.4 mg/dL — ABNORMAL LOW (ref 8.9–10.3)
Calcium: 7.6 mg/dL — ABNORMAL LOW (ref 8.9–10.3)
Chloride: 94 mmol/L — ABNORMAL LOW (ref 98–111)
Chloride: 97 mmol/L — ABNORMAL LOW (ref 98–111)
Chloride: 98 mmol/L (ref 98–111)
Chloride: 103 mmol/L (ref 98–111)
Creatinine, Ser: 0.87 mg/dL (ref 0.61–1.24)
Creatinine, Ser: 0.85 mg/dL (ref 0.61–1.24)
Creatinine, Ser: 0.87 mg/dL (ref 0.61–1.24)
Creatinine, Ser: 0.76 mg/dL (ref 0.61–1.24)
GFR, Estimated: >60 mL/min (ref >60)
GFR, Estimated: >60 mL/min (ref >60)
GFR, Estimated: >60 mL/min (ref >60)
GFR, Estimated: >60 mL/min (ref >60)
Glucose, Bld: 126 mg/dL — ABNORMAL HIGH (ref 70–99)
Glucose, Bld: 108 mg/dL — ABNORMAL HIGH (ref 70–99)
Glucose, Bld: 87 mg/dL (ref 70–99)
Glucose, Bld: 145 mg/dL — ABNORMAL HIGH (ref 70–99)
Potassium: 4.1 mmol/L (ref 3.5–5.1)
Potassium: 4 mmol/L (ref 3.5–5.1)
Potassium: 4.1 mmol/L (ref 3.5–5.1)
Potassium: 3.4 mmol/L — ABNORMAL LOW (ref 3.5–5.1)
Sodium: 125 mmol/L — ABNORMAL LOW (ref 135–145)
Sodium: 127 mmol/L — ABNORMAL LOW (ref 135–145)
Sodium: 127 mmol/L — ABNORMAL LOW (ref 135–145)
Sodium: 131 mmol/L — ABNORMAL LOW (ref 135–145)

## 2021-11-21 LAB — CBG MONITORING, ED: Glucose-Capillary: 88 mg/dL (ref 70–99)

## 2021-11-21 LAB — OSMOLALITY, URINE: Osmolality, Ur: 159 mOsm/kg — ABNORMAL LOW (ref 300–900)

## 2021-11-21 LAB — OSMOLALITY: Osmolality: 264 mOsm/kg — ABNORMAL LOW (ref 275–295)

## 2021-11-21 NOTE — ED Notes (Signed)
Echo tech at bedside.

## 2021-11-21 NOTE — ED Notes (Signed)
Discharge instructions reviewed and all questions answered. IV removed and pt ambulating ad lib, declines W/C for D/C and wants to walk. Pt ambulated out of facility in stable condition with all belongings

## 2021-11-21 NOTE — ED Notes (Signed)
Pt asking if he can go home today. Message sent to provider

## 2021-11-21 NOTE — Discharge Summary (Signed)
Physician Discharge Summary   Patient: Wayne Brandt MRN: [id number] DOB: [birth year]  Admit date:     11/20/2021  Discharge date: 11/21/21  Discharge Physician: Marylu Lund   PCP: Rita Ohara, MD   Recommendations at discharge:   F/u with PCP in 1-2 weeks   Discharge Diagnoses: Principal Problem:   Near syncope Active Problems:   Hyponatremia   Exercise-induced asthma   Eosinophilic esophagitis   Essential hypertension   BPH with urinary obstruction  Resolved Problems:   * No resolved hospital problems. *  Hospital Course: 71 y.o. male with medical history significant of exercise-induced asthma, eosinophilic esophagitis, hypertension, BPH, polio presenting after episode of near syncope.  Patient reports having GI discomfort earlier in the day that felt like possible constipation.  He took a laxative and then ate some food.  He was sitting on the couch and had sudden onset nausea with associated lightheadedness diaphoresis.  Following this he vomited and felt better after vomiting.  During this episode of lightheadedness he did not collapse but his wife states that he was not communicating normally and his responsiveness was decreased. He states that he felt like he was going to black out.  EMS was called and initially noted some abnormal EKG changes and code STEMI was called.  This was canceled by cardiology upon review of patient's rhythm.  Patient reports ongoing cough.  Denies fevers, chills, chest pain, shortness of breath.   ED Course: Vital signs in the ED significant for heart rate in the 50s to 60s, blood pressure in the 119E to 174Y systolic.  Lab workup included CMP with sodium 125, chloride 93, glucose 106, protein 6.4, AST 42.  CBC with hemoglobin of 10.9 down from previous baseline of 12-13.  Troponin flat at 22 and 18 on repeat.  Lipase normal.  FOBT negative.  Chest x-ray without acute abnormality.  As above cardiology cleared patient from questionable STEMI in ED    Assessment and Plan: Near syncope > Patient presenting after an episode ofA of lightheadedness, nausea, diaphoresis ultimately resulting in vomiting and then feeling better.  During this episode wife reports he had decreased responsiveness  > Patient did not actually collapse.  Appears to be near syncopal episode suspicious for possible vasovagal etiology, unclear trigger however he did feel constipated earlier in the day but not triggered during bowel movement. - Echocardiogram reviewed, unremarkable - remained stable, seemed improved with IVF  Hyponatremia > Patient noted incidentally to have sodium of 125 in the ED.  Unclear etiology possibly hypovolemic, does appear to be chronic with values in the 125-126 rand for 1-2 years - Improved with IVF hydration -Na improved to 814    Eosinophilic esophagitis - Continue home PPI  Hypertension - Continue home losartan  BPH - Continue home Myrbetriq        Consultants:  Procedures performed:   Disposition: Home Diet recommendation:  Regular diet DISCHARGE MEDICATION: Allergies as of 11/21/2021   No Known Allergies      Medication List     TAKE these medications    albuterol 108 (90 Base) MCG/ACT inhaler Commonly known as: VENTOLIN HFA Inhale 2 puffs into the lungs every 6 (six) hours as needed for wheezing. Use 30 mins prior to exercise   ascorbic acid 500 MG tablet Commonly known as: VITAMIN C Take 500 mg by mouth daily.   CoQ10 100 MG Caps Take 1 capsule by mouth daily.   cyanocobalamin 1000 MCG tablet Commonly known as: VITAMIN B12 Take  1,000 mcg by mouth daily.   fish oil-omega-3 fatty acids 1000 MG capsule Take 1 g by mouth daily.   guaiFENesin 600 MG 12 hr tablet Commonly known as: MUCINEX Take 600 mg by mouth 2 (two) times daily as needed for cough or to loosen phlegm.   losartan 100 MG tablet Commonly known as: COZAAR TAKE 1 TABLET(100 MG) BY MOUTH DAILY   MYRBETRIQ PO Take 1 tablet by mouth at  bedtime. Sample Pack   omeprazole 40 MG capsule Commonly known as: PRILOSEC TAKE 1 CAPSULE(40 MG) BY MOUTH DAILY   rosuvastatin 5 MG tablet Commonly known as: CRESTOR Take 1 tablet (5 mg total) by mouth daily.   vitamin B-6 500 MG tablet Take 500 mg by mouth daily.   Vitamin D 50 MCG (2000 UT) Caps Take 1 capsule by mouth daily.        Follow-up Information     Rita Ohara, MD Follow up in 1 week(s).   Specialty: Family Medicine Contact information: [place] [id number] [phone]                Discharge Exam: Danley Danker Weights   11/20/21 1952  Weight: 73.5 kg   General exam: Awake, laying in bed, in nad Respiratory system: Normal respiratory effort, no wheezing Cardiovascular system: regular rate, s1, s2 Gastrointestinal system: Soft, nondistended, positive BS Central nervous system: CN2-12 grossly intact, strength intact Extremities: Perfused, no clubbing Skin: Normal skin turgor, no notable skin lesions seen Psychiatry: Mood normal // no visual hallucinations    Condition at discharge: fair  The results of significant diagnostics from this hospitalization (including imaging, microbiology, ancillary and laboratory) are listed below for reference.   Imaging Studies: ECHOCARDIOGRAM COMPLETE  Result Date: 11/21/2021    ECHOCARDIOGRAM REPORT   Patient Name:   [name] Date of Exam: 11/21/2021 Medical Rec #:  [id number]         Height:       68.0 in Accession #:    [id number]        Weight:       162.0 lb Date of Birth:  October 06, 1950          BSA:          1.869 m Patient Age:    71 years          BP:           160/88 mmHg Patient Gender: M                 HR:           58 bpm. Exam Location:  Inpatient Procedure: 2D Echo, Cardiac Doppler and Color Doppler Indications:    Syncope  History:        Patient has no prior history of Echocardiogram examinations.                 Signs/Symptoms:Dyspnea; Risk Factors:Hypertension and Family                  History of Coronary Artery Disease. Pt mentioned experiencing                 dyspnea upon exertion that has been ongoing since 2021.  Sonographer:    Eartha Inch Referring Phys: [id number] Candace Gallus MELVIN  Sonographer Comments: Image acquisition challenging due to patient body habitus. IMPRESSIONS  1. Left ventricular ejection fraction, by estimation, is 60 to 65%. The left ventricle has normal function. The left ventricle  has no regional wall motion abnormalities. Left ventricular diastolic parameters are indeterminate.  2. Right ventricular systolic function is normal. The right ventricular size is normal.  3. Right atrial size was mildly dilated.  4. The mitral valve is normal in structure. Trivial mitral valve regurgitation. No evidence of mitral stenosis.  5. The aortic valve is calcified. There is moderate calcification of the aortic valve. There is moderate thickening of the aortic valve. Aortic valve regurgitation is trivial. Aortic valve sclerosis/calcification is present, without any evidence of aortic stenosis.  6. Aortic dilatation noted. There is mild dilatation of the ascending aorta, measuring 38 mm. FINDINGS  Left Ventricle: Left ventricular ejection fraction, by estimation, is 60 to 65%. The left ventricle has normal function. The left ventricle has no regional wall motion abnormalities. The left ventricular internal cavity size was normal in size. There is  no left ventricular hypertrophy. Left ventricular diastolic parameters are indeterminate. Normal left ventricular filling pressure. Right Ventricle: The right ventricular size is normal. No increase in right ventricular wall thickness. Right ventricular systolic function is normal. Left Atrium: Left atrial size was normal in size. Right Atrium: Right atrial size was mildly dilated. Pericardium: There is no evidence of pericardial effusion. Mitral Valve: The mitral valve is normal in structure. Trivial mitral valve regurgitation.  No evidence of mitral valve stenosis. Tricuspid Valve: The tricuspid valve is normal in structure. Tricuspid valve regurgitation is mild . No evidence of tricuspid stenosis. Aortic Valve: The aortic valve is calcified. There is moderate calcification of the aortic valve. There is moderate thickening of the aortic valve. Aortic valve regurgitation is trivial. Aortic valve sclerosis/calcification is present, without any evidence of aortic stenosis. Pulmonic Valve: The pulmonic valve was normal in structure. Pulmonic valve regurgitation is trivial. No evidence of pulmonic stenosis. Aorta: Aortic dilatation noted. There is mild dilatation of the ascending aorta, measuring 38 mm. Venous: The inferior vena cava was not well visualized. IAS/Shunts: No atrial level shunt detected by color flow Doppler.  LEFT VENTRICLE PLAX 2D LVIDd:         4.50 cm      Diastology LVIDs:         3.50 cm      LV e' medial:    5.44 cm/s LV PW:         1.00 cm      LV E/e' medial:  13.8 LV IVS:        1.10 cm      LV e' lateral:   8.05 cm/s LVOT diam:     2.20 cm      LV E/e' lateral: 9.3 LV SV:         106 LV SV Index:   57 LVOT Area:     3.80 cm  LV Volumes (MOD) LV vol d, MOD A2C: 111.0 ml LV vol d, MOD A4C: 93.6 ml LV vol s, MOD A2C: 38.4 ml LV vol s, MOD A4C: 30.5 ml LV SV MOD A2C:     72.6 ml LV SV MOD A4C:     93.6 ml LV SV MOD BP:      68.1 ml RIGHT VENTRICLE RV S prime:     12.80 cm/s TAPSE (M-mode): 2.1 cm LEFT ATRIUM             Index        RIGHT ATRIUM           Index LA diam:        3.80 cm 2.03  cm/m   RA Area:     13.10 cm LA Vol (A2C):   66.5 ml 35.59 ml/m  RA Volume:   28.90 ml  15.47 ml/m LA Vol (A4C):   49.5 ml 26.49 ml/m LA Biplane Vol: 61.3 ml 32.80 ml/m  AORTIC VALVE LVOT Vmax:   134.00 cm/s LVOT Vmean:  87.700 cm/s LVOT VTI:    0.280 m  AORTA Ao Root diam: 3.60 cm Ao Asc diam:  3.80 cm MITRAL VALVE               TRICUSPID VALVE MV Area (PHT): 2.49 cm    TR Peak grad:   19.9 mmHg MV Decel Time: 305 msec    TR  Vmax:        223.00 cm/s MV E velocity: 75.00 cm/s MV A velocity: 77.60 cm/s  SHUNTS MV E/A ratio:  0.97        Systemic VTI:  0.28 m                            Systemic Diam: 2.20 cm Fransico Him MD Electronically signed by Fransico Him MD Signature Date/Time: 11/21/2021/10:40:33 AM    Final    DG Chest Portable 1 View  Result Date: 11/20/2021 CLINICAL DATA:  Code STEMI EXAM: PORTABLE CHEST 1 VIEW COMPARISON:  Chest x-ray 02/21/2016 FINDINGS: The heart and mediastinal contours are unchanged. Aortic calcification. No focal consolidation. No pulmonary edema. No pleural effusion. No pneumothorax. No acute osseous abnormality. IMPRESSION: 1. No active disease. 2.  Aortic Atherosclerosis (ICD10-I70.0). Electronically Signed   By: Iven Finn M.D.   On: 11/20/2021 20:31    Microbiology: Results for orders placed or performed during the hospital encounter of 03/21/19  Surgical pcr screen     Status: Abnormal   Collection Time: 03/21/19  2:53 PM   Specimen: Nasal Mucosa; Nasal Swab  Result Value Ref Range Status   MRSA, PCR NEGATIVE NEGATIVE Final   Staphylococcus aureus POSITIVE (A) NEGATIVE Final    Comment: (NOTE) The Xpert SA Assay (FDA approved for NASAL specimens in patients 43 years of age and older), is one component of a comprehensive surveillance program. It is not intended to diagnose infection nor to guide or monitor treatment. Performed at [facility], [place] [address], [place], [place] [address]     Labs: CBC: Recent Labs  Lab 11/20/21 1950 11/21/21 0321  WBC 7.6 5.8  HGB 10.9* 10.6*  HCT 30.6* 30.2*  MCV 90.8 91.5  PLT 204 381   Basic Metabolic Panel: Recent Labs  Lab 11/20/21 1950 11/21/21 0020 11/21/21 0321 11/21/21 0606 11/21/21 1204  NA 125* 125* 127* 127* 131*  K 4.0 4.1 4.0 4.1 3.4*  CL 93* 94* 97* 98 103  CO2 '23 23 25 22 24  '$ GLUCOSE 108* 126* 108* 87 145*  BUN '12 11 12 12 8  '$ CREATININE 0.89 0.87 0.85 0.87 0.76  CALCIUM 9.0 8.6*  8.5* 8.4* 7.6*   Liver Function Tests: Recent Labs  Lab 11/20/21 1950  AST 42*  ALT 22  ALKPHOS 75  BILITOT 0.5  PROT 6.4*  ALBUMIN 3.7   CBG: Recent Labs  Lab 11/21/21 0556  GLUCAP 88    Discharge time spent: less than 30 minutes.  Signed: Marylu Lund, MD [facility] 11/21/2021

## 2021-11-21 NOTE — Progress Notes (Signed)
  Echocardiogram 2D Echocardiogram has been performed.  Wayne Brandt 11/21/2021, 10:25 AM

## 2021-11-24 ENCOUNTER — Telehealth: Payer: Self-pay

## 2021-11-24 NOTE — Telephone Encounter (Signed)
Transition Care Management Follow-up Telephone Call Date of discharge and from where: Wayne Brandt 11/21/21 How have you been since you were released from the hospital? [place] Any questions or concerns? No  Items Reviewed: Did the pt receive and understand the discharge instructions provided? Yes  Medications obtained and verified? Yes  Other? No  Any new allergies since your discharge? No  Dietary orders reviewed? Yes Do you have support at home? Yes   Home Care and Equipment/Supplies: Were home health services ordered? no  Functional Questionnaire: (I = Independent and D = Dependent) ADLs: I  Bathing/Dressing- I  Meal Prep- I  Eating- I  Maintaining continence- I  Transferring/Ambulation- I  Managing Meds- I  Follow up appointments reviewed:  PCP Hospital f/u appt confirmed? Yes  Scheduled to see Wayne Brandt on 11/21/21 @ 2:30. [facility] f/u appt confirmed? No   Are transportation arrangements needed? No  If their condition worsens, is the pt aware to call PCP or go to the Emergency Dept.? Yes Was the patient provided with contact information for the PCP's office or ED? Yes Was to pt encouraged to call back with questions or concerns? Yes

## 2021-11-26 NOTE — Progress Notes (Unsigned)
  No chief complaint on file.   Patient presents for hospital follow-up. He went to ER on 8/10 after almost passing out.  He had some GI discomfort, thought he was constipated, took a laxative.  After eating, he had sudden onset of nausea, LH, diaphoresis, and ended up vomiting. Felt better after vomiting. He had LH, no syncope, though felt like he might pass out.  Wife reported he wasn't communicating normally.  He was noted to have Na of 125. This is fairly chronic. Improved with IVF hydration, repeat Na was 131.  Review of labs from ER-- Hgb dropped 2 points to 10.9, normocytic. No iron studies were done Lab Results  Component Value Date   WBC 5.8 11/21/2021   HGB 10.6 (L) 11/21/2021   HCT 30.2 (L) 11/21/2021   MCV 91.5 11/21/2021   PLT 183 11/21/2021     Chemistry      Component Value Date/Time   NA 131 (L) 11/21/2021 1204   NA 126 (L) 07/09/2021 1026   K 3.4 (L) 11/21/2021 1204   CL 103 11/21/2021 1204   CO2 24 11/21/2021 1204   BUN 8 11/21/2021 1204   BUN 10 07/09/2021 1026   CREATININE 0.76 11/21/2021 1204   CREATININE 0.77 07/09/2016 0843      Component Value Date/Time   CALCIUM 7.6 (L) 11/21/2021 1204   ALKPHOS 75 11/20/2021 1950   AST 42 (H) 11/20/2021 1950   ALT 22 11/20/2021 1950   BILITOT 0.5 11/20/2021 1950   BILITOT 0.3 07/09/2021 1026       ASSESSMENT/PLAN:  Cbc ,B12, iron panel, b-met  11/2018 EGD, dilatation. mod erosive duodenitis, mild superficial gastritis--omeprazole 40mg  x 1 mo needs CTA or MRA to f/u thoracic 3.5cm dilatation 12/2020.-- Done 12/2020 IMPRESSION: 1. No evidence of thoracic aortic aneurysm or dissection. 2. Coronary calcifications.  Aortic Atherosclerosis (ICD10-I70.0).     F/u as scheduled for AWV 9/6, with labs prior

## 2021-11-27 ENCOUNTER — Ambulatory Visit: Payer: Medicare Other | Admitting: Family Medicine

## 2021-11-27 ENCOUNTER — Encounter: Payer: Self-pay | Admitting: Family Medicine

## 2021-11-27 VITALS — BP 130/80 | HR 56 | Ht 68.0 in | Wt 166.6 lb

## 2021-11-27 DIAGNOSIS — E871 Hypo-osmolality and hyponatremia: Secondary | ICD-10-CM

## 2021-11-27 DIAGNOSIS — H6592 Unspecified nonsuppurative otitis media, left ear: Secondary | ICD-10-CM | POA: Diagnosis not present

## 2021-11-27 DIAGNOSIS — Z5181 Encounter for therapeutic drug level monitoring: Secondary | ICD-10-CM | POA: Diagnosis not present

## 2021-11-27 DIAGNOSIS — J309 Allergic rhinitis, unspecified: Secondary | ICD-10-CM | POA: Diagnosis not present

## 2021-11-27 DIAGNOSIS — I10 Essential (primary) hypertension: Secondary | ICD-10-CM

## 2021-11-27 DIAGNOSIS — R55 Syncope and collapse: Secondary | ICD-10-CM

## 2021-11-27 DIAGNOSIS — D649 Anemia, unspecified: Secondary | ICD-10-CM | POA: Diagnosis not present

## 2021-11-27 NOTE — Patient Instructions (Signed)
Use Flonase, 2 gentle sniffs into each nostril, once daily. This should help with your postnasal drainage, head congestion and ear plugging, but may take up to 10 days to see the full effect.

## 2021-11-28 LAB — CBC WITH DIFFERENTIAL/PLATELET
Basophils Absolute: 0.1 10*3/uL (ref 0.0–0.2)
Basos: 1 %
EOS (ABSOLUTE): 0.4 10*3/uL (ref 0.0–0.4)
Eos: 6 %
Hematocrit: 31.4 % — ABNORMAL LOW (ref 37.5–51.0)
Hemoglobin: 11 g/dL — ABNORMAL LOW (ref 13.0–17.7)
Immature Grans (Abs): 0 10*3/uL (ref 0.0–0.1)
Immature Granulocytes: 0 %
Lymphocytes Absolute: 1.5 10*3/uL (ref 0.7–3.1)
Lymphs: 20 %
MCH: 31.5 pg (ref 26.6–33.0)
MCHC: 35 g/dL (ref 31.5–35.7)
MCV: 90 fL (ref 79–97)
Monocytes Absolute: 0.9 10*3/uL (ref 0.1–0.9)
Monocytes: 12 %
Neutrophils Absolute: 4.7 10*3/uL (ref 1.4–7.0)
Neutrophils: 61 %
Platelets: 228 10*3/uL (ref 150–450)
RBC: 3.49 x10E6/uL — ABNORMAL LOW (ref 4.14–5.80)
RDW: 12.7 % (ref 11.6–15.4)
WBC: 7.6 10*3/uL (ref 3.4–10.8)

## 2021-11-28 LAB — BASIC METABOLIC PANEL
BUN/Creatinine Ratio: 11 (ref 10–24)
BUN: 10 mg/dL (ref 8–27)
CO2: 22 mmol/L (ref 20–29)
Calcium: 8.8 mg/dL (ref 8.6–10.2)
Chloride: 87 mmol/L — ABNORMAL LOW (ref 96–106)
Creatinine, Ser: 0.89 mg/dL (ref 0.76–1.27)
Glucose: 92 mg/dL (ref 70–99)
Potassium: 4.6 mmol/L (ref 3.5–5.2)
Sodium: 122 mmol/L — ABNORMAL LOW (ref 134–144)
eGFR: 91 mL/min/{1.73_m2} (ref >59)

## 2021-11-28 LAB — IRON AND TIBC
Iron Saturation: 22 % (ref 15–55)
Iron: 36 ug/dL — ABNORMAL LOW (ref 38–169)
Total Iron Binding Capacity: 162 ug/dL — ABNORMAL LOW (ref 250–450)
UIBC: 126 ug/dL (ref 111–343)

## 2021-11-28 LAB — VITAMIN B12: Vitamin B-12: 1256 pg/mL — ABNORMAL HIGH (ref 232–1245)

## 2021-11-29 LAB — SPECIMEN STATUS REPORT

## 2021-11-29 LAB — FERRITIN: Ferritin: 114 ng/mL (ref 30–400)

## 2021-12-01 ENCOUNTER — Inpatient Hospital Stay: Payer: Medicare Other | Admitting: Family Medicine

## 2021-12-11 ENCOUNTER — Other Ambulatory Visit: Payer: Medicare Other

## 2021-12-11 DIAGNOSIS — Z5181 Encounter for therapeutic drug level monitoring: Secondary | ICD-10-CM

## 2021-12-11 DIAGNOSIS — Z125 Encounter for screening for malignant neoplasm of prostate: Secondary | ICD-10-CM

## 2021-12-11 DIAGNOSIS — I700 Atherosclerosis of aorta: Secondary | ICD-10-CM

## 2021-12-11 DIAGNOSIS — E559 Vitamin D deficiency, unspecified: Secondary | ICD-10-CM

## 2021-12-12 LAB — CBC WITH DIFFERENTIAL/PLATELET
Basophils Absolute: 0 10*3/uL (ref 0.0–0.2)
Basos: 1 %
EOS (ABSOLUTE): 0.3 10*3/uL (ref 0.0–0.4)
Eos: 6 %
Hematocrit: 36.5 % — ABNORMAL LOW (ref 37.5–51.0)
Hemoglobin: 12.5 g/dL — ABNORMAL LOW (ref 13.0–17.7)
Immature Grans (Abs): 0 10*3/uL (ref 0.0–0.1)
Immature Granulocytes: 0 %
Lymphocytes Absolute: 1.2 10*3/uL (ref 0.7–3.1)
Lymphs: 24 %
MCH: 32.1 pg (ref 26.6–33.0)
MCHC: 34.2 g/dL (ref 31.5–35.7)
MCV: 94 fL (ref 79–97)
Monocytes Absolute: 0.7 10*3/uL (ref 0.1–0.9)
Monocytes: 14 %
Neutrophils Absolute: 2.7 10*3/uL (ref 1.4–7.0)
Neutrophils: 55 %
Platelets: 176 10*3/uL (ref 150–450)
RBC: 3.89 x10E6/uL — ABNORMAL LOW (ref 4.14–5.80)
RDW: 13.2 % (ref 11.6–15.4)
WBC: 5 10*3/uL (ref 3.4–10.8)

## 2021-12-12 LAB — COMPREHENSIVE METABOLIC PANEL
ALT: 22 IU/L (ref 0–44)
AST: 36 IU/L (ref 0–40)
Albumin/Globulin Ratio: 1.6 (ref 1.2–2.2)
Albumin: 4.3 g/dL (ref 3.8–4.8)
Alkaline Phosphatase: 109 IU/L (ref 44–121)
BUN/Creatinine Ratio: 11 (ref 10–24)
BUN: 10 mg/dL (ref 8–27)
Bilirubin Total: 0.5 mg/dL (ref 0.0–1.2)
CO2: 26 mmol/L (ref 20–29)
Calcium: 9.3 mg/dL (ref 8.6–10.2)
Chloride: 91 mmol/L — ABNORMAL LOW (ref 96–106)
Creatinine, Ser: 0.93 mg/dL (ref 0.76–1.27)
Globulin, Total: 2.7 g/dL (ref 1.5–4.5)
Glucose: 83 mg/dL (ref 70–99)
Potassium: 5.1 mmol/L (ref 3.5–5.2)
Sodium: 128 mmol/L — ABNORMAL LOW (ref 134–144)
Total Protein: 7 g/dL (ref 6.0–8.5)
eGFR: 87 mL/min/{1.73_m2} (ref >59)

## 2021-12-12 LAB — LIPID PANEL
Chol/HDL Ratio: 1.8 ratio (ref 0.0–5.0)
Cholesterol, Total: 154 mg/dL (ref 100–199)
HDL: 84 mg/dL (ref >39)
LDL Chol Calc (NIH): 59 mg/dL (ref 0–99)
Triglycerides: 48 mg/dL (ref 0–149)
VLDL Cholesterol Cal: 11 mg/dL (ref 5–40)

## 2021-12-12 LAB — PSA: Prostate Specific Ag, Serum: 0.3 ng/mL (ref 0.0–4.0)

## 2021-12-12 LAB — VITAMIN D 25 HYDROXY (VIT D DEFICIENCY, FRACTURES): Vit D, 25-Hydroxy: 47.3 ng/mL (ref 30.0–100.0)

## 2021-12-14 NOTE — Patient Instructions (Incomplete)
HEALTH MAINTENANCE RECOMMENDATIONS:  It is recommended that you get at least 30 minutes of aerobic exercise at least 5 days/week (for weight loss, you may need as much as 60-90 minutes). This can be any activity that gets your heart rate up. This can be divided in 10-15 minute intervals if needed, but try and build up your endurance at least once a week.  Weight bearing exercise is also recommended twice weekly.  Eat a healthy diet with lots of vegetables, fruits and fiber.  "Colorful" foods have a lot of vitamins (ie green vegetables, tomatoes, red peppers, etc).  Limit sweet tea, regular sodas and alcoholic beverages, all of which has a lot of calories and sugar.  Up to 2 alcoholic drinks daily may be beneficial for men (unless trying to lose weight, watch sugars).  Drink a lot of water.  Sunscreen of at least SPF 30 should be used on all sun-exposed parts of the skin when outside between the hours of 10 am and 4 pm (not just when at beach or pool, but even with exercise, golf, tennis, and yard work!)  Use a sunscreen that says "broad spectrum" so it covers both UVA and UVB rays, and make sure to reapply every 1-2 hours.  Remember to change the batteries in your smoke detectors when changing your clock times in the spring and fall.  Carbon monoxide detectors are recommended for your home.  Use your seat belt every time you are in a car, and please drive safely and not be distracted with cell phones and texting while driving.    Wayne Brandt , Thank you for taking time to come for your Medicare Wellness Visit. I appreciate your ongoing commitment to your health goals. Please review the following plan we discussed and let me know if I can assist you in the future.   This is a list of the screening recommended for you and due dates:  Health Maintenance  Topic Date Due   Zoster (Shingles) Vaccine (1 of 2) Never done   COVID-19 Vaccine (4 - Pfizer risk series) 04/25/2020   Flu Shot  11/11/2021    Colon Cancer Screening  02/16/2024   Tetanus Vaccine  07/22/2027   Pneumonia Vaccine  Completed   Hepatitis C Screening: USPSTF Recommendation to screen - Ages 18-79 yo.  Completed   HPV Vaccine  Aged Out   Please get your high dose flu shot--we don't have it in the office yet, but check back next week or get it from the pharmacy.  I recommend getting the new RSV vaccine. You will need to get this from the pharmacy.  It should be available now. You need to either get vaccines the same day, or separate them by at least 2 weeks.  I encourage you to get the updated COVID booster when it becomes available this Fall. (Late September, early October?)  I recommend getting the new shingles vaccine (Shingrix). Since you have Medicare, you will need to get this from the pharmacy, as it is covered by Part D. This is a series of 2 injections, spaced 2 months apart.   This should be separated from other vaccines by at least 2 weeks.   Please bring [place] copies of your Living Will and [place] once completed and notarized so that it can be scanned into your medical chart.  Please get more vitamin D.  I believe you had been taking 2000 IU daily, continue this dose (as your last level was good).

## 2021-12-14 NOTE — Progress Notes (Unsigned)
No chief complaint on file.   Wayne Brandt is a 71 y.o. male who presents for annual physical exam Medicare wellness visit and follow-up on chronic medical conditions.  See below for labs done prior to his visit.  He recently saw ENT for acute L serous otitis. He was treated with a medrol dosepak, flonase, and advised on valsalva maneuvers. Interestingly, he had the same thing last year, around the same time.  He was seen last month for ER follow-up after episode of vasovagal syncope. There had been some drop in his hemoglobin noted in ER labs.  These were repeated, and improved.  Component Ref Range & Units 3 d ago (12/11/21) 2 wk ago (11/27/21) 3 wk ago (11/21/21) 3 wk ago (11/20/21) 1 yr ago (12/04/20) 2 yr ago (11/30/19) 2 yr ago (03/21/19)  WBC 3.4 - 10.8 x10E3/uL 5.0  7.6  5.8 R  7.6 R  6.8  6.9  9.1 R   RBC 4.14 - 5.80 x10E6/uL 3.89 Low   3.49 Low   3.30 Low  R  3.37 Low  R  4.19  3.99 Low   3.92 Low  R   Hemoglobin 13.0 - 17.7 g/dL 12.5 Low   11.0 Low   10.6 Low  R  10.9 Low  R  12.9 Low   12.7 Low   12.8 Low  R   Hematocrit 37.5 - 51.0 % 36.5 Low   31.4 Low   30.2 Low  R  30.6 Low  R  38.8  36.7 Low   36.9 Low  R   MCV 79 - 97 fL 94  90  91.5 R  90.8 R  93  92  94.1 R   MCH 26.6 - 33.0 pg 32.1  31.5  32.1 R  32.3 R  30.8  31.8  32.7 R   MCHC 31.5 - 35.7 g/dL 34.2  35.0  35.1 R  35.6 R  33.2  34.6  34.7 R   RDW 11.6 - 15.4 % 13.2  12.7  13.3 R  13.3 R  13.6  13.1  13.1 R   Platelets 150 - 450 x10E3/uL 176  228  183 R  204 R  187  236     Lab Results  Component Value Date   IRON 36 (L) 11/27/2021   TIBC 162 (L) 11/27/2021   FERRITIN 114 11/27/2021   Hypertension:  He is compliant with Losartan '100mg'$  daily, and denies side effects.  BPs are running  BP monitor has been verified as accurate (12/2019). He denies headaches, dizziness, chest pain, shortness of breath, swelling.  (We had previously started HCTZ 12.'5mg'$  in 03/2020 after noting higher BP's at home, but he stopped  due to urinary frequency/urgency and worsening of tinnitus).    H/o RAD/asthma. He usually only has problems at Christmas (when around pets at his family's house). He uses an inhaler with good results.  He currently denies any wheezing or shortness of breath.  He notices that he gets more out of breath if he tries to push himself like he used to on the elliptical. Denies any exertional chest pain. 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   Eosinophilic esophagitis:  Previously required EGD and dilatations; last of which was in 11/2018, showing mod erosive duodenitis, mild superficial gastritis.  He previously saw Dr. Earlean Shawl. He remains on omeprazole '40mg'$  daily. Currently denies any dysphagia  or heartburn.  Hiatal hernia was noted on CTA done 12/2020.   Aortic atherosclerosis:  Noted on CXR in 2017. He initially declined statin treatment. He was sent for CT cardiac scoring in 12/2019 due to strong FH heart disease and atherosclerosis. This showed Ca score of 73, 38th percentile. Mild dilation of ascending thoracic aorta was noted (measuring up to 3.5cm).  Annual imaging by CTA or MRA was recommended.  He had f/u CTA in 12/2020: IMPRESSION: 1. No evidence of thoracic aortic aneurysm or dissection. 2. Coronary calcifications.  Aortic Atherosclerosis (ICD10-I70.0).   He was started on Crestor '5mg'$  daily after coronary scan in 2021. He is currently taking Crestor 3x/week.  He thought he had more joint pain when taking it daily, so had cut back to every other day. Lipids were at goal when taking it qod. In the Spring he mentioned that he still had a lot of joint pains, and was going to re-try it daily, to see if it makes anything worse.     Vitamin D deficiency (mild):  Level was low at 27 lin 2016; last check was normal (40.7) in 2019. His current dose is 2000 IU of D3. Level was 47.3 on labs done before his visit.    BPH: s/p TURP 05/2016 by Dr. Karsten Ro. He gets up 3-4x/night, contributed by drinking tea during the day, beer in the evenings. He is able to get back to sleep.  He was told he has a very small bladder. Samples of Myrbetriq? Peyronie's disease and erectile issues.  Asymptomatic with respect to Peyronie's. No treatment was recommended.  He is not in a sexual relationship with his wife (due to her health issues, fibromyalgia, etc.). He only follows with him prn (not yearly), not seen in the last few years. UPDATE    Immunization History  Administered Date(s) Administered   Fluad Quad(high Dose 65+) 02/20/2019, 12/21/2019   Influenza Whole 01/05/2008   Influenza, High Dose Seasonal PF 02/06/2016, 06/21/2017, 02/16/2018   Influenza,inj,Quad PF,6+ Mos 02/01/2013, 05/28/2015, 02/19/2021   Influenza,inj,quad, With Preservative 02/20/2019, 03/13/2020   PFIZER(Purple Top)SARS-COV-2 Vaccination 05/20/2019, 06/13/2019, 02/29/2020   Pneumococcal Conjugate-13 07/04/2015   Pneumococcal Polysaccharide-23 06/01/2007, 07/09/2016   Td 06/20/1997, 07/21/2017   Tdap 01/05/2008   Zoster, Live 02/25/2012   Last colonoscopy: 02/2014; pt states told 10 years Last PSA: Lab Results  Component Value Date   PSA1 0.3 12/11/2021   PSA1 0.3 12/04/2020   PSA1 0.3 11/30/2019   PSA 0.85 07/04/2015   PSA 0.73 06/28/2014   PSA 1.43 06/26/2013   Dentist: twice yearly   Ophtho: yearly Exercise: daily (elliptical, bike) for 20-30 minutes every day; also playing tennis, golf.   Also uses rowing machine at weight machines at the gym (every other day)  Patient Care Team: Rita Ohara, MD as PCP - General (Family Medicine) ENT: Dr. Redmond Baseman (and his PA, Pietro Cassis) Urologist: Dr. Karsten Ro (sees prn) Dentist: Dr. Deatra Ina Dermatologist: Dr. Renda Rolls Cardiologist: Dr. Percival Spanish GI: Dr. Earlean Shawl (retired) Ortho: Dr. Wynelle Link, Dr. Marlou Sa (for shoulder), Dr. Burney Gauze, Dr. Onnie Graham (R shoulder)  Depression Screening: [place] from 07/09/2021 in [place]  PHQ-2 Total Score 0         Falls screen:     11/03/2021   10:50 AM 08/11/2021   10:00 AM 07/09/2021    9:30 AM 12/04/2020    8:32 AM 11/30/2019    9:42 AM  [place] in the past year? 0 0 0 0 0  Number falls in past yr: 0 0 0 0   Injury with Fall? 0 0 0 0   Risk for fall due to : No Fall Risks  No Fall Risks No Fall Risks   Follow up Falls evaluation completed  Falls evaluation completed Falls evaluation completed      Functional Status Survey:         He doesn't have Living Will or Healthcare power of attorney. He has been given paperwork in the past.    PMH, PSH, SH and FH were reviewed and updated    ROS: The patient denies anorexia, fever, headaches, vision loss, hoarseness, chest pain, palpitations, dizziness, syncope, dyspnea on exertion, swelling, nausea, vomiting, diarrhea, abdominal pain, melena, hematochezia, hematuria, dysuria, weakness, tremor, suspicious skin lesions (sees derm, Dr. Renda Rolls regularly), depression, anxiety, abnormal bleeding or enlarged lymph nodes    H/o microscopic hematuria--no visible blood per pt   No rectal bleeding since getting hemorrhoidal banding   Occasional tingling R hand from ulnar nerve issue (chronic)--intermittent, overall doing better. Taking B6 has helped a lot.  Only slight numbness noted today. Up 2-4x/night to void. Denies hesitancy, weakened stream. Has some dribbling at the end. Denies dysphagia, heartburn. Occasional ringing in the left ear, tolerable (slightly worse since COVID). Plugging improved with steroids, flonase, valsalva. Sees dermatology regularly. Easy bruising on arms, no bleeding.   PHYSICAL EXAM:  There were no vitals taken for this visit.  Wt Readings from Last 3 Encounters:  11/27/21 166 lb 9.6 oz (75.6 kg)  11/20/21 162 lb (73.5 kg)  11/03/21 165 lb (74.8 kg)    General Appearance:    Alert, cooperative, no distress,  appears stated age.    Head:    Normocephalic, atraumatic  Eyes:    PERRL, conjunctiva/corneas clear, EOM's intact, fundi benign     Ears:    TM and EAC normal on the right.  Effusion on the left (TM appears slightly white as well, broad light reflex  Nose:    No drainage, sinuses nontender  Throat:    Normal mucosa, no lesions  Neck:    Supple, no lymphadenopathy; thyroid: no enlargement/ tenderness/ nodules; no carotid bruit or JVD     Back:    Spine nontender, no curvature, ROM normal, no CVA tenderness     Lungs:    Clear to auscultation bilaterally without wheezes, rales or ronchi; respirations unlabored     Chest Wall:    No tenderness or deformity     Heart:    Regular rate and rhythm, S1 and S2 normal, no murmur, rub or gallop     Breast Exam:    No chest wall tenderness, masses or gynecomastia     Abdomen:    Soft, non-tender, nondistended, normoactive bowel sounds, no masses, no hepatosplenomegaly     Genitalia:    Circumcised penis, no rash, lesions, discharge. Testicles are normal, no masses.  No hernias.  Rectal:    Normal sphincter tone.  Heme negative stool.  Prostate is smooth, not enlarged, no nodules. There is a palpable nodule in the rectum on the left (previously confirmed to be internal hemorrhoid on anoscopy last yer)  Extremities:    No clubbing, cyanosis or edema. +leg length discrepancy--left is shorter than right leg (since L ankle fusion per pt). L ankle fused; atrophy of LLE; WHSS R knee, mildly swollen, warm, no erythema, nontender (chronic, unchanged from prior exams). WHSS and swelling/effusion with mild warmth at L ankle today. Thickening and onycholytic changes  to many nails.  Bulge (popeye deformity) noted in RUE with contraction of biceps muscle  Pulses:    2+ and symmetric all extremities    Skin:    Skin color, texture, turgor normal, no rashes. Actinic damage throughout. Purpura/bruising on hands/forearms.   Lymph nodes:    Cervical, supraclavicular, and  inguinal nodes normal     Neurologic:    Normal strength, sensation and gait; reflexes 2+ and symmetric throughout                   Psych:   Normal mood, affect, hygiene and grooming  ***update rectal--nodule on L? Swelling of R knee? Swelling/warmth L ankle?? Update nails-- Popeye RUE Purpura/bruising on hands/forearms?  Lab Results  Component Value Date   WBC 5.0 12/11/2021   HGB 12.5 (L) 12/11/2021   HCT 36.5 (L) 12/11/2021   MCV 94 12/11/2021   PLT 176 12/11/2021   Lab Results  Component Value Date   CHOL 154 12/11/2021   HDL 84 12/11/2021   LDLCALC 59 12/11/2021   TRIG 48 12/11/2021   CHOLHDL 1.8 12/11/2021   PSA 0.3 Vitamin D-OH 47.3 Fasting glucose 83    Chemistry      Component Value Date/Time   NA 128 (L) 12/11/2021 0847   K 5.1 12/11/2021 0847   CL 91 (L) 12/11/2021 0847   CO2 26 12/11/2021 0847   BUN 10 12/11/2021 0847   CREATININE 0.93 12/11/2021 0847   CREATININE 0.77 07/09/2016 0843      Component Value Date/Time   CALCIUM 9.3 12/11/2021 0847   ALKPHOS 109 12/11/2021 0847   AST 36 12/11/2021 0847   ALT 22 12/11/2021 0847   BILITOT 0.5 12/11/2021 0847       ASSESSMENT/PLAN:  Did he ever get shingrix or any bivalent COVID booster from pharmacy? High dose flu shot today if we have any  Taking crestor daily or still qod? Is he taking Myrbetriq? Lists sample in his med list, unsure if this is old.  Has he seen urologist recently at all?  Never brought in living will healthcare POA--DOES HE NEED NEW FORMS?  Shingrix, RSV, COVID boosters and high dose flu shot rec.   He called needing omeprazole filled prior to his visit.  [facility] put an entire year supply instead of auth just the #90 until visit...  Recommended at least 30 minutes of aerobic activity at least 5 days/week, weight-bearing exercise at least 2x/wk; proper sunscreen use reviewed; healthy diet and alcohol recommendations (less than or equal to 2 drinks/day) reviewed; regular  seatbelt use; changing batteries in smoke detectors. Immunization recommendations discussed--continue high dose flu shots yearly. Shingrix recommended, to get from pharmacy, risks/SE reviewed. Newly updated COVID booster recommended when available in the Fall. RSV vaccine also recommended, to get from the pharmacy. Colonoscopy recommendations reviewed--UTD.     Most form reviewed/completed, Full Code, Full care Patient reminded to do healthcare power of attorney and living will, and get [place] copies once notarized/completed.  F/u 1 year, sooner if BP's elevated   Medicare Attestation I have personally reviewed: The patient's medical and social history Their use of alcohol, tobacco or illicit drugs Their current medications and supplements The patient's functional ability including ADLs,fall risks, home safety risks, cognitive, and hearing and visual impairment Diet and physical activities Evidence for depression or mood disorders  The patient's weight, height, BMI have been recorded in the chart.  I have made referrals, counseling, and provided education to the patient based on review  of the above and I have provided the patient with a written personalized care plan for preventive services.

## 2021-12-17 ENCOUNTER — Encounter: Payer: Self-pay | Admitting: Family Medicine

## 2021-12-17 ENCOUNTER — Ambulatory Visit (INDEPENDENT_AMBULATORY_CARE_PROVIDER_SITE_OTHER): Payer: Medicare Other | Admitting: Family Medicine

## 2021-12-17 ENCOUNTER — Encounter: Payer: Self-pay | Admitting: Internal Medicine

## 2021-12-17 VITALS — BP 140/70 | HR 60 | Ht 68.0 in | Wt 163.8 lb

## 2021-12-17 DIAGNOSIS — D692 Other nonthrombocytopenic purpura: Secondary | ICD-10-CM

## 2021-12-17 DIAGNOSIS — J309 Allergic rhinitis, unspecified: Secondary | ICD-10-CM | POA: Diagnosis not present

## 2021-12-17 DIAGNOSIS — E559 Vitamin D deficiency, unspecified: Secondary | ICD-10-CM | POA: Diagnosis not present

## 2021-12-17 DIAGNOSIS — Z0000 Encounter for general adult medical examination without abnormal findings: Secondary | ICD-10-CM

## 2021-12-17 DIAGNOSIS — E871 Hypo-osmolality and hyponatremia: Secondary | ICD-10-CM | POA: Diagnosis not present

## 2021-12-17 DIAGNOSIS — K449 Diaphragmatic hernia without obstruction or gangrene: Secondary | ICD-10-CM

## 2021-12-17 DIAGNOSIS — I10 Essential (primary) hypertension: Secondary | ICD-10-CM | POA: Diagnosis not present

## 2021-12-17 DIAGNOSIS — K200 Eosinophilic esophagitis: Secondary | ICD-10-CM

## 2021-12-17 DIAGNOSIS — I700 Atherosclerosis of aorta: Secondary | ICD-10-CM

## 2021-12-17 MED ORDER — ROSUVASTATIN CALCIUM 5 MG PO TABS: 5.0000 mg | ORAL_TABLET | Freq: Every day | ORAL | 3 refills | Status: DC

## 2022-01-20 ENCOUNTER — Encounter: Payer: Self-pay | Admitting: Internal Medicine

## 2022-01-21 DIAGNOSIS — H6993 Unspecified Eustachian tube disorder, bilateral: Secondary | ICD-10-CM | POA: Insufficient documentation

## 2022-01-25 ENCOUNTER — Encounter: Payer: Self-pay | Admitting: Family Medicine

## 2022-02-16 ENCOUNTER — Other Ambulatory Visit: Payer: Self-pay | Admitting: Family Medicine

## 2022-02-16 MED ORDER — LOSARTAN POTASSIUM 100 MG PO TABS: ORAL_TABLET | ORAL | 2 refills | Status: DC

## 2022-02-20 ENCOUNTER — Encounter: Payer: Self-pay | Admitting: Medical

## 2022-02-20 ENCOUNTER — Telehealth: Payer: Medicare Other | Admitting: Medical

## 2022-02-20 VITALS — BP 128/61

## 2022-02-20 DIAGNOSIS — R059 Cough, unspecified: Secondary | ICD-10-CM

## 2022-02-20 DIAGNOSIS — J329 Chronic sinusitis, unspecified: Secondary | ICD-10-CM | POA: Diagnosis not present

## 2022-02-20 MED ORDER — CLARITHROMYCIN 500 MG PO TABS: 500.0000 mg | ORAL_TABLET | Freq: Two times a day (BID) | ORAL | 0 refills | Status: DC

## 2022-02-20 NOTE — Progress Notes (Signed)
Subjective:     Patient ID: Wayne Brandt, male   DOB: 23-Nov-1950, 71 y.o.   MRN: [id number]  This visit type was conducted due to national recommendations for restrictions regarding the COVID-19 Pandemic (e.g. social distancing) in an effort to limit this patient's exposure and mitigate transmission in our community.  Due to their co-morbid illnesses, this patient is at least at moderate risk for complications without adequate follow up.  This format is felt to be most appropriate for this patient at this time.    Documentation for virtual audio and video telecommunications through [place] encounter:  The patient was located at home. The provider was located in the office. The patient did consent to this visit and is aware of possible charges through their insurance for this visit.  The other persons participating in this telemedicine service were none. Time spent on call was 20 minutes and in review of previous records 20 minutes total.  This virtual service is not related to other E/M service within previous 7 days.   HPI Chief Complaint  Patient presents with   cold symptoms x 2 weeks    Cold symptoms x 2 weeks. Runny nose, congestion, mucous is yellow, cough, HA, sinus pressure. Using nettipot and flonase   Virtual consult for illness.  Started with runny nose, sore throat. Those symptoms stopped and since then has had headaches, head pressure, cough, congestion, feels completely stopped up.   Getting all yellow discharge from nose.  Using netipot, flonase.  Getting drainage leading to coughing a lot.  Was using benadryl and claritin while having the runny nose, but nothing is helping currently symptoms.   No other aggravating or relieving factors. No other complaint.   Past Medical History:  Diagnosis Date   Actinic keratosis    Dr. Renda Rolls   Arthritis    wrists, L thumb   BPH (benign prostatic hyperplasia)    ED (erectile dysfunction)    Family history of premature  CAD    GERD (gastroesophageal reflux disease)    HH (hiatus hernia)    History of basal cell carcinoma excision    2013-- chest area   History of esophagogastroduodenoscopy (EGD) 12/06/2017   Dr.Medoff-distal esophageal stricture,hiatal hernia,gastritis and duodenitis.   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     Coenzyme Q10 (COQ10) 100 MG CAPS Take 1 capsule by mouth daily.     fish oil-omega-3 fatty acids 1000 MG capsule Take 1 g by mouth daily.     losartan (COZAAR) 100 MG tablet TAKE 1 TABLET(100 MG) BY MOUTH DAILY 90 tablet 2   omeprazole (PRILOSEC) 40 MG capsule TAKE 1 CAPSULE(40 MG) BY MOUTH DAILY 90 capsule 3   Pyridoxine HCl (VITAMIN B-6) 500 MG tablet Take 500 mg by mouth daily.     rosuvastatin (CRESTOR) 5 MG tablet Take 1 tablet (5 mg total) by mouth daily. 90 tablet 3   vitamin B-12 (CYANOCOBALAMIN) 1000 MCG tablet Take 1,000 mcg by mouth daily.     vitamin  C (ASCORBIC ACID) 500 MG tablet Take 500 mg by mouth daily.     albuterol (VENTOLIN HFA) 108 (90 Base) MCG/ACT inhaler Inhale 2 puffs into the lungs every 6 (six) hours as needed for wheezing. Use 30 mins prior to exercise (Patient not taking: Reported on 02/20/2022) 18 g 1   No current facility-administered medications on file prior to visit.   Review of Systems As in subjective       Objective:   Physical Exam Due to coronavirus pandemic stay at home measures, patient visit was virtual and they were not examined in person.   BP 128/61   Gen: wd, wn, nad Congested sounding  No wheezing       Assessment:      Encounter Diagnoses  Name Primary?   Sinusitis, unspecified chronicity, unspecified location Yes   Cough, unspecified type        Plan:      We discussed symptoms and concerns.  Begin medication below.  Hold off on statin until finished with this medication due to unlikely but possible interaction.  continue Benadryl at night, continue neti pot nasal saline flush and good hydration.  If not much improved within a week let me know.  Follow-up with ENT in 3 to 4 weeks.  I asked him to cancel his appointment for next week with ENT recheck since it would be too early given his current symptoms.  He was due back to follow-up on ongoing ear pressure and congestion and the appointment with ENT need to be postponed by at least 3 to 4 weeks given his current treatment   Wayne Brandt was seen today for cold symptoms x 2 weeks.  Diagnoses and all orders for this visit:  Sinusitis, unspecified chronicity, unspecified location  Cough, unspecified type  Other orders -     clarithromycin (BIAXIN) 500 MG tablet; Take 1 tablet (500 mg total) by mouth 2 (two) times daily.  F/u prn

## 2022-02-25 NOTE — Progress Notes (Unsigned)
No chief complaint on file.  He saw Shane 11/10 with 2 weeks of symptoms. Started with runny nose, sore throat. Those symptoms stopped and since then has had headaches, head pressure, cough, congestion, feels completely stopped up.   Getting all yellow discharge from nose.  Using netipot, flonase.  Getting drainage leading to coughing a lot.  Was using benadryl and claritin while having the runny nose, but nothing is helping currently symptoms.   No other aggravating or relieving factors. No other complaint.   He was prescribed Biaxin. He was told to hold his statin until finished with the antibiotic, to continue Benadryl at night, neti pot nasal saline flush and good hydration.   He was told to follow-up with ENT in 3 to 4 weeks (he recommended postponing f/u on ongoing ear pressure/congestion due to illness).    Today he reports   PMH, PSH, SH reviewed   ROS:   PHYSICAL EXAM:  There were no vitals taken for this visit.  Wt Readings from Last 3 Encounters:  12/17/21 163 lb 12.8 oz (74.3 kg)  11/27/21 166 lb 9.6 oz (75.6 kg)  11/20/21 162 lb (73.5 kg)       ASSESSMENT/PLAN:

## 2022-02-26 ENCOUNTER — Encounter: Payer: Self-pay | Admitting: Family Medicine

## 2022-02-26 ENCOUNTER — Ambulatory Visit: Payer: Medicare Other | Admitting: Family Medicine

## 2022-02-26 VITALS — BP 130/72 | HR 72 | Temp 97.9°F | Ht 68.0 in | Wt 167.0 lb

## 2022-02-26 DIAGNOSIS — J329 Chronic sinusitis, unspecified: Secondary | ICD-10-CM

## 2022-02-26 DIAGNOSIS — R0989 Other specified symptoms and signs involving the circulatory and respiratory systems: Secondary | ICD-10-CM | POA: Diagnosis not present

## 2022-02-26 DIAGNOSIS — R059 Cough, unspecified: Secondary | ICD-10-CM

## 2022-02-26 DIAGNOSIS — J029 Acute pharyngitis, unspecified: Secondary | ICD-10-CM | POA: Diagnosis not present

## 2022-02-26 LAB — POC COVID19 BINAXNOW: SARS Coronavirus 2 Ag: NEGATIVE

## 2022-02-26 LAB — POCT INFLUENZA A/B
Influenza A, POC: NEGATIVE
Influenza B, POC: NEGATIVE

## 2022-02-26 NOTE — Patient Instructions (Signed)
Drink plenty of water. Complete the antibiotic course that you are taking. Resume the cholesterol medication once you finish the antibiotics. You can take Mucinex DM 12 hour tablets twice daily, to help with your cough and congestion. You may also use the tessalon, if needed.  Contact us if you develop fever, if the mucus or phlegm again becomes discolored, and chest pain, shortness of breath, pain with breathing.  Consider using your inhaler 20 minutes prior to exercise, if exercising is making you cough more. Listen to your body, and rest when needed.

## 2022-03-19 ENCOUNTER — Other Ambulatory Visit (INDEPENDENT_AMBULATORY_CARE_PROVIDER_SITE_OTHER): Payer: Medicare Other

## 2022-03-19 DIAGNOSIS — Z23 Encounter for immunization: Secondary | ICD-10-CM

## 2022-04-09 ENCOUNTER — Encounter: Payer: Self-pay | Admitting: Family Medicine

## 2022-04-09 DIAGNOSIS — I700 Atherosclerosis of aorta: Secondary | ICD-10-CM

## 2022-04-09 MED ORDER — ROSUVASTATIN CALCIUM 5 MG PO TABS: 5.0000 mg | ORAL_TABLET | Freq: Every day | ORAL | 0 refills | Status: DC

## 2022-04-15 ENCOUNTER — Other Ambulatory Visit (INDEPENDENT_AMBULATORY_CARE_PROVIDER_SITE_OTHER): Payer: Medicare Other

## 2022-04-15 ENCOUNTER — Telehealth (INDEPENDENT_AMBULATORY_CARE_PROVIDER_SITE_OTHER): Payer: Medicare Other | Admitting: Medical

## 2022-04-15 ENCOUNTER — Encounter: Payer: Self-pay | Admitting: Medical

## 2022-04-15 VITALS — Temp 98.8°F | Ht 68.0 in | Wt 161.0 lb

## 2022-04-15 DIAGNOSIS — J988 Other specified respiratory disorders: Secondary | ICD-10-CM

## 2022-04-15 DIAGNOSIS — J45990 Exercise induced bronchospasm: Secondary | ICD-10-CM | POA: Diagnosis not present

## 2022-04-15 DIAGNOSIS — R059 Cough, unspecified: Secondary | ICD-10-CM

## 2022-04-15 LAB — POC COVID19 BINAXNOW: SARS Coronavirus 2 Ag: POSITIVE — AB

## 2022-04-15 LAB — POCT RESPIRATORY SYNCYTIAL VIRUS: RSV Rapid Ag: NEGATIVE

## 2022-04-15 LAB — POCT INFLUENZA A/B
Influenza A, POC: NEGATIVE
Influenza B, POC: NEGATIVE

## 2022-04-15 MED ORDER — HYDROCODONE BIT-HOMATROP MBR 5-1.5 MG/5ML PO SOLN: 5.0000 mL | Freq: Three times a day (TID) | ORAL | 0 refills | Status: AC | PRN

## 2022-04-15 MED ORDER — MOLNUPIRAVIR EUA 200MG CAPSULE: 4.0000 | ORAL_CAPSULE | Freq: Two times a day (BID) | ORAL | 0 refills | Status: AC

## 2022-04-15 MED ORDER — ALBUTEROL SULFATE HFA 108 (90 BASE) MCG/ACT IN AERS: 2.0000 | INHALATION_SPRAY | Freq: Four times a day (QID) | RESPIRATORY_TRACT | 0 refills | Status: AC | PRN

## 2022-04-15 NOTE — Progress Notes (Signed)
Subjective:     Patient ID: Wayne Brandt, male   DOB: 1950/07/20, 72 y.o.   MRN: [id number]  This visit type was conducted due to national recommendations for restrictions regarding the COVID-19 Pandemic (e.g. social distancing) in an effort to limit this patient's exposure and mitigate transmission in our community.  Due to their co-morbid illnesses, this patient is at least at moderate risk for complications without adequate follow up.  This format is felt to be most appropriate for this patient at this time.    Documentation for virtual audio and video telecommunications through [place] encounter:  The patient was located at a diner? The provider was located in the office. The patient did consent to this visit and is aware of possible charges through their insurance for this visit.  The other persons participating in this telemedicine service were none. Time spent on call was 15 minutes and in review of previous records 20 minutes total.  This virtual service is not related to other E/M service within previous 7 days.   HPI Chief Complaint  Patient presents with   other    Last night slight fever 98.8 bad cough normal temp for him 97.8, coughed all last night, drainage, nose feels clear now,    Virtual consult for illness.  Of note discussed the fact that he was seated in a diner and not private location.  He wanted to proceed with the visit.  He notes 2 day history of cough,coughing all night last night, some chills, but no fever, no SOB or wheezing, no NVD, no headache, no sore throat.  No sick contacts.   No recent covid test.   Used some left over hycodan syrup last night.  No other aggravating or relieving factors. No other complaint.  Past Medical History:  Diagnosis Date   Actinic keratosis    Dr. Renda Rolls   Arthritis    wrists, L thumb   BPH (benign prostatic hyperplasia)    ED (erectile dysfunction)    Family history of premature CAD    GERD (gastroesophageal  reflux disease)    HH (hiatus hernia)    History of basal cell carcinoma excision    2013-- chest area   History of esophagogastroduodenoscopy (EGD) 12/06/2017   Dr.Medoff-distal esophageal stricture,hiatal hernia,gastritis and duodenitis.   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     Coenzyme Q10 (COQ10) 100 MG CAPS Take 1 capsule by mouth daily.     fish oil-omega-3 fatty acids 1000 MG capsule Take 1 g by mouth daily.     losartan (COZAAR) 100 MG tablet TAKE 1 TABLET(100 MG) BY MOUTH DAILY 90 tablet 2   omeprazole (PRILOSEC) 40 MG capsule TAKE 1 CAPSULE(40 MG) BY MOUTH DAILY 90 capsule 3   Pyridoxine HCl (VITAMIN B-6) 500 MG tablet Take 500 mg by mouth daily.     rosuvastatin (CRESTOR) 5 MG tablet Take 1 tablet (5 mg total) by mouth daily. 90 tablet 0   vitamin B-12 (CYANOCOBALAMIN) 1000 MCG tablet Take  1,000 mcg by mouth daily.     vitamin C (ASCORBIC ACID) 500 MG tablet Take 500 mg by mouth daily.     No current facility-administered medications on file prior to visit.    Review of Systems As in subjective    Objective:   Physical Exam Due to coronavirus pandemic stay at home measures, patient visit was virtual and they were not examined in person.   Temp 98.8 F (37.1 C)   Ht '5\' 8"'$  (1.727 m)   Wt 161 lb (73 kg)   BMI 24.48 kg/m   Gen: wd, wn, nad, not particularly ill appearing No witnessed wheezing or labored breathing      Assessment:     Encounter Diagnoses  Name Primary?   Cough, unspecified type Yes   Respiratory tract infection    Cough    Exercise-induced  asthma        Plan:     Discussed symptoms, concerns, and discussed that preferably this should be a private encounter, not in a public place such as the diner he appears to be at presently.  Advised hydration, rest, can continue OTC remedies for cough/congestion.  He will come to our back parking lot for flu/covid/rsv testing now.  Addendum: He tested positive for COVID.  Nurse called him and explained the recommendations below, medications were sent to the pharmacy    Your result is positive for COVID   Recommendations: Begin molnupiravir antiviral medicine to cut down on the risk of severe disease or hospitalization I sent albuterol inhaler to the pharmacy you can use if you are short of breath or wheezy.  You can do 2 puffs every 4 hours if needed I sent a refill on strong prescription cough syrup you can use as needed.  I believe he had a little bit of your old cough syrup left Rest, hydrate well If much worse over the next few days such as difficulty breathing, high fever, extreme weakness then go to the emergency department You need to quarantine a minimum of 5 days.  If you are still having a fever lots of sneezing or lots of cough at day 5 then quarantine longer.  Until this is much better After your quarantine, then use a mask around people for additional 5 days      Wayne Brandt was seen today for other.  Diagnoses and all orders for this visit:  Cough, unspecified type  Respiratory tract infection  Cough -     albuterol (VENTOLIN HFA) 108 (90 Base) MCG/ACT inhaler; Inhale 2 puffs into the lungs every 6 (six) hours as needed for wheezing. Use 30 mins prior to exercise  Exercise-induced asthma Comments: known asthmatic with some residual coughing after exercise, suspect due to RAD. refilled albuterol  PFT's today.  Use MDI prior to exercse rather than rescue Orders: -     albuterol (VENTOLIN HFA) 108 (90 Base) MCG/ACT inhaler; Inhale 2 puffs into the lungs every 6 (six)  hours as needed for wheezing. Use 30 mins prior to exercise  Other orders -     molnupiravir EUA (LAGEVRIO) 200 mg CAPS capsule; Take 4 capsules (800 mg total) by mouth 2 (two) times daily for 5 days. -     HYDROcodone bit-homatropine (HYCODAN) 5-1.5 MG/5ML syrup; Take 5 mLs by mouth every 8 (eight) hours as needed for up to 5 days for cough.   F/u prn

## 2022-04-15 NOTE — Telephone Encounter (Unsigned)
Your result is positive for COVID  Recommendations: Begin molnupiravir antiviral medicine to cut down on the risk of severe disease or hospitalization I sent albuterol inhaler to the pharmacy you can use if you are short of breath or wheezy.  You can do 2 puffs every 4 hours if needed I sent a refill on strong prescription cough syrup you can use as needed.  I believe he had a little bit of your old cough syrup left Rest, hydrate well If much worse over the next few days such as difficulty breathing, high fever, extreme weakness then go to the emergency department You need to quarantine a minimum of 5 days.  If you are still having a fever lots of sneezing or lots of cough at day 5 then quarantine longer.  Until this is much better After your quarantine, then use a mask around people for additional 5 days   In general in the future virtual visit should be in a private setting.  We did the visit earlier you appear to be in a restaurant or diner with people around you.  This is not an appropriate setting for virtual consult due to [place] HIPPA and privacy concerns as well as the fact that you are positive for COVID and you were around other people exposing them as well.

## 2022-06-04 ENCOUNTER — Ambulatory Visit: Payer: Medicare Other | Admitting: Podiatry

## 2022-06-04 ENCOUNTER — Encounter: Payer: Self-pay | Admitting: Podiatry

## 2022-06-04 DIAGNOSIS — M21619 Bunion of unspecified foot: Secondary | ICD-10-CM | POA: Diagnosis not present

## 2022-06-04 DIAGNOSIS — M778 Other enthesopathies, not elsewhere classified: Secondary | ICD-10-CM | POA: Diagnosis not present

## 2022-06-04 MED ORDER — TRIAMCINOLONE ACETONIDE 10 MG/ML IJ SUSP
10.0000 mg | Freq: Once | INTRAMUSCULAR | Status: AC
  Administered 2022-06-04: 10 mg

## 2022-06-04 NOTE — Progress Notes (Signed)
Subjective:   Patient ID: Wayne Brandt, male   DOB: 72 y.o.   MRN: [id number]   HPI Patient states he developed a lot of pain in his right foot and states that it is in the midfoot the front of the foot he did have bunion surgery approximately 25 years ago.  Patient is very active does not smoke   Review of Systems  All other systems reviewed and are negative.       Objective:  Physical Exam Vitals and nursing note reviewed.  Constitutional:      Appearance: He is well-developed.  Pulmonary:     Effort: Pulmonary effort is normal.  Musculoskeletal:        General: Normal range of motion.  Skin:    General: Skin is warm.  Neurological:     Mental Status: He is alert.     Neurovascular status found to be intact muscle strength was found to be adequate range of motion adequate with first MPJ functioning well from previous surgery of years ago.  Patient has a lot of swelling around the first metatarsal cuneiform joint with inflammation and fluid buildup around this area with moderate collapse medial longitudinal arch and history of polio in the left leg with fusion of the ankle     Assessment:  Acute inflammatory condition of the metatarsal cuneiform joint right with chronic arthritis of the joint surface     Plan:  H&P x-rays reviewed and I went ahead today did sterile prep and injected around the metatarsal cuneiform joint 3 mg Kenalog 5 mg Xylocaine advised on supportive therapy discussed if this does not improve we will have to get CT MRI and consider fusion at 1 point in future.  Patient will be seen back depending on how symptoms do with education rendered today  X-rays indicate pins still in place from previous bunion surgery with quite a bit of arthritis collapse of the metatarsal cuneiform joint right

## 2022-07-16 ENCOUNTER — Other Ambulatory Visit: Payer: Self-pay | Admitting: *Deleted

## 2022-07-16 ENCOUNTER — Encounter: Payer: Self-pay | Admitting: Family Medicine

## 2022-07-16 DIAGNOSIS — I700 Atherosclerosis of aorta: Secondary | ICD-10-CM

## 2022-07-16 MED ORDER — ROSUVASTATIN CALCIUM 5 MG PO TABS
5.0000 mg | ORAL_TABLET | Freq: Every day | ORAL | 1 refills | Status: AC
Start: 2022-07-16 — End: ?

## 2022-07-29 ENCOUNTER — Encounter: Payer: Self-pay | Admitting: Gastroenterology

## 2022-07-29 ENCOUNTER — Telehealth: Payer: Self-pay | Admitting: Gastroenterology

## 2022-07-29 NOTE — Telephone Encounter (Signed)
He needs a clinic visit with me or next available provider (physician or APP).  As it stands now, it looks like I have a late afternoon appointment May 1.  -HD

## 2022-07-29 NOTE — Telephone Encounter (Signed)
Patient scheduled 5/1

## 2022-07-29 NOTE — Telephone Encounter (Signed)
Good Afternoon Dr Myrtie Neither,   I received a call from this patient wishing to transfer his care over to you due to Dr Kinnie Scales retiring. Patient states he normally gets his esophagus stretched regularly, but since being on Omeprazole he's been able to spread them out. He had his last procedure done in 2020 and is looking to get one done soon with you given he has recently had a few things get stuck as he's trying to swallow. Records of past procedures are available to view in Epic, please review them at your earliest convenience and advise on scheduling.  Thank You!

## 2022-08-05 ENCOUNTER — Encounter: Payer: Self-pay | Admitting: Family Medicine

## 2022-08-06 ENCOUNTER — Encounter: Payer: Self-pay | Admitting: Family Medicine

## 2022-08-06 ENCOUNTER — Other Ambulatory Visit: Payer: Self-pay

## 2022-08-06 ENCOUNTER — Ambulatory Visit: Payer: Medicare Other | Admitting: Family Medicine

## 2022-08-06 VITALS — BP 136/80 | HR 56 | Ht 68.0 in | Wt 170.0 lb

## 2022-08-06 DIAGNOSIS — M62838 Other muscle spasm: Secondary | ICD-10-CM | POA: Diagnosis not present

## 2022-08-06 DIAGNOSIS — M542 Cervicalgia: Secondary | ICD-10-CM | POA: Diagnosis not present

## 2022-08-06 IMAGING — CT CT CARDIAC CORONARY ARTERY CALCIUM SCORE
3 series · 13 of 20 positions shown, 15 images · non-contrast
Comparison: None

CLINICAL DATA: 69-year-old Caucasian male with family history of
heart disease.

EXAM:
CT CARDIAC CORONARY ARTERY CALCIUM SCORE
TECHNIQUE: Non-contrast imaging through the heart was performed using
prospective ECG gating. Image post processing was performed on an
independent workstation, allowing for quantitative analysis of the
heart and coronary arteries. Note that this exam targets the heart
and the chest was not imaged in its entirety.

[Series 2: calcium scoring 2.00 qr36 bestdiast 71% hrt calciu · axial · 0.38mm/px · z∈[+1400,+1464]mm · 3 of 80 slices shown]
[im 16/80  vessel]
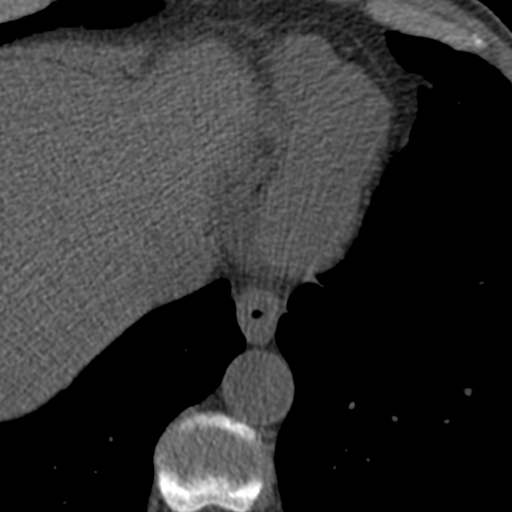
[im 32/80  vessel]
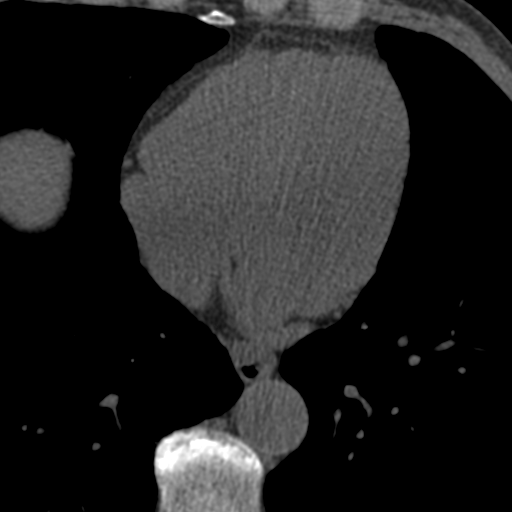
[im 48/80  vessel]
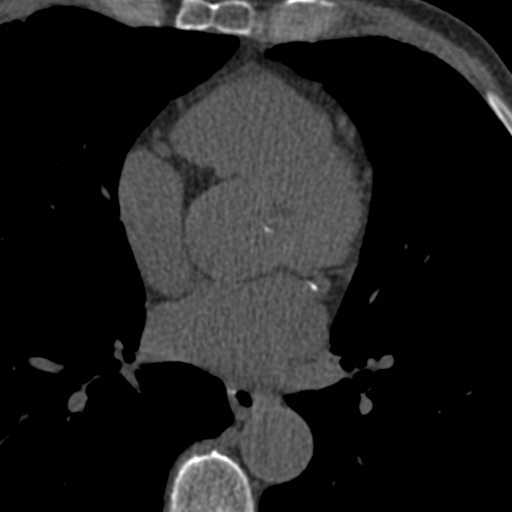

[Series 3: calcium scoring 2.00 br40 bestdiast 71% axial · axial · 0.60mm/px · z∈[+1396,+1500]mm · 5 of 80 slices shown, 7 images]
[im 14/80  vessel]
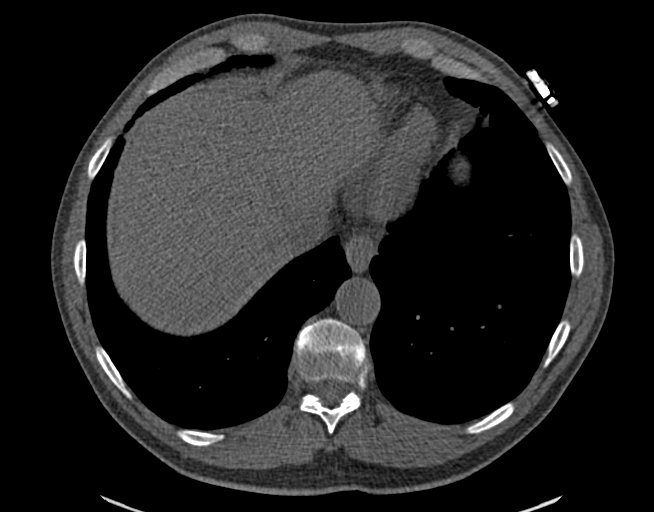
[im 14/80  lung]
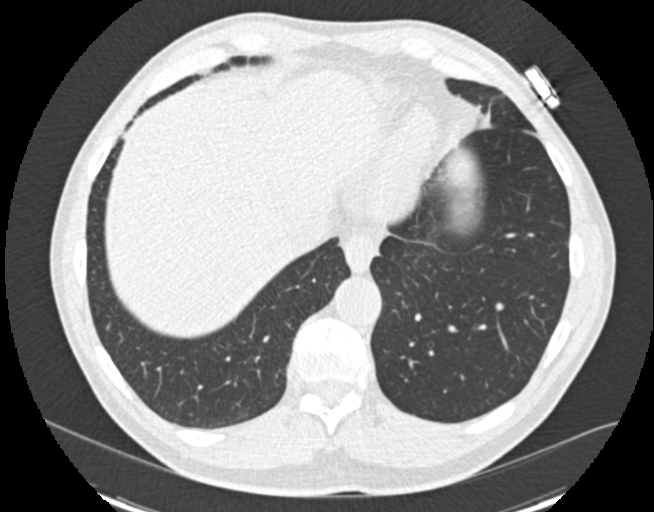
[im 27/80  vessel]
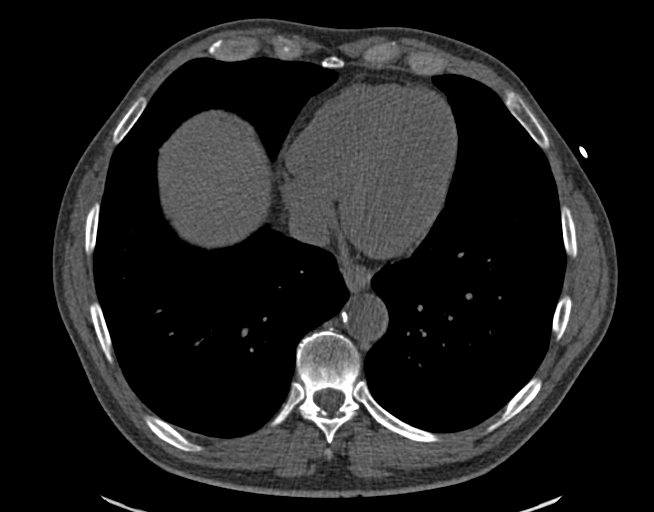
[im 40/80  vessel]
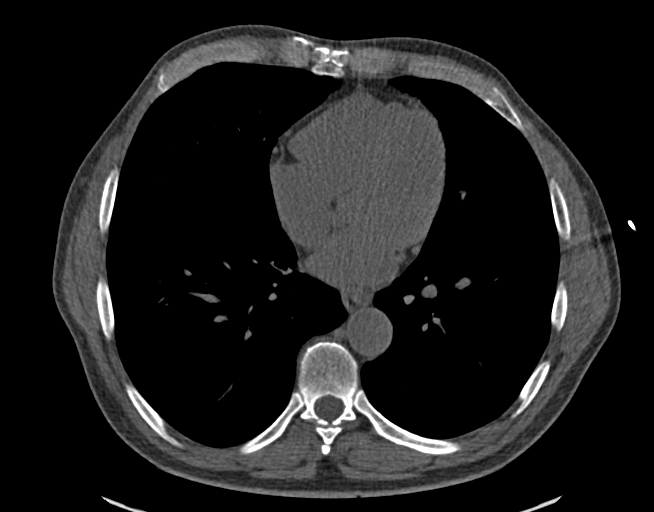
[im 53/80  vessel]
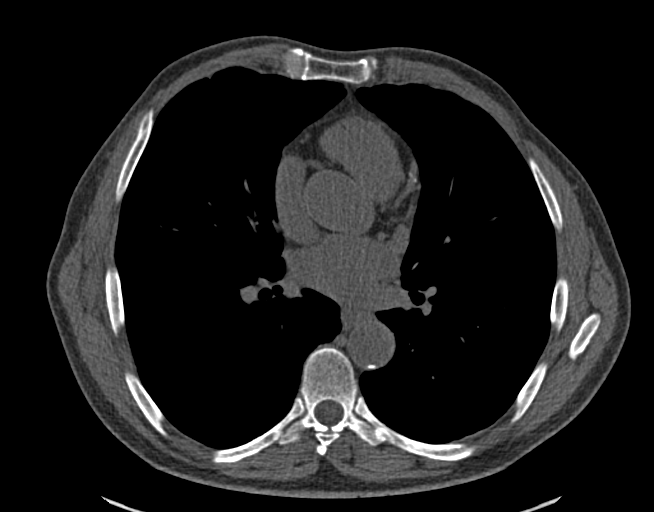
[im 66/80  vessel]
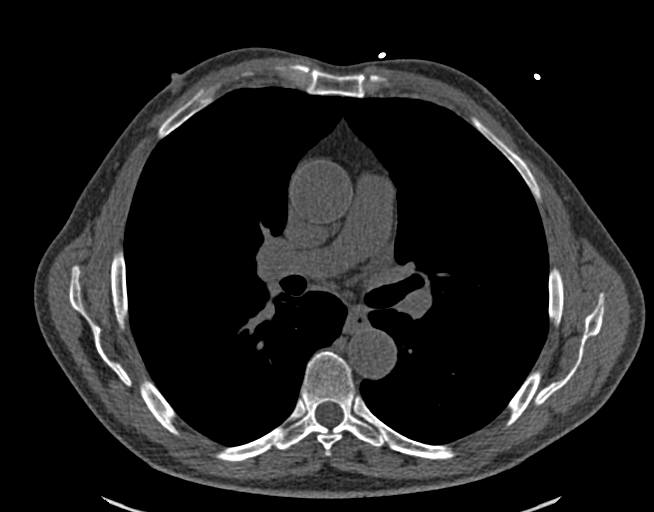
[im 66/80  lung]
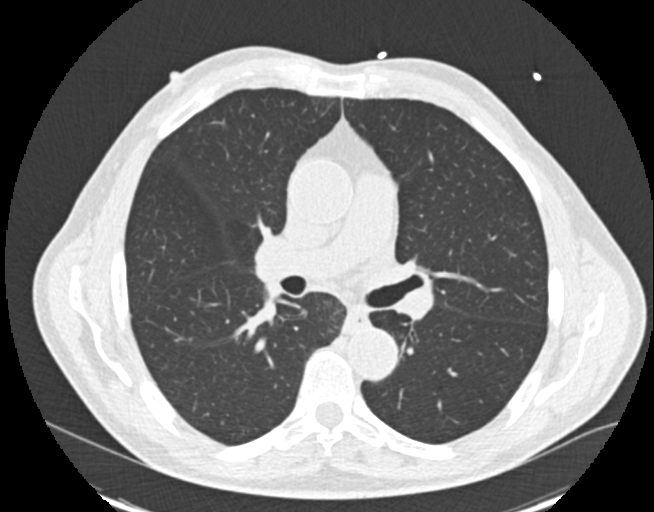

[Series 9: calcium scoring 2.00 br60 bestdiast 71% lungs · axial · 0.60mm/px · z∈[+1396,+1500]mm · 5 of 80 slices shown]
[im 14/80  vessel]
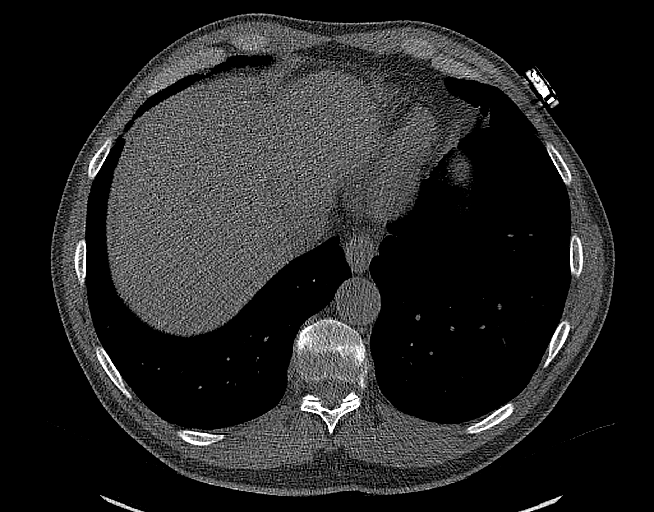
[im 27/80  vessel]
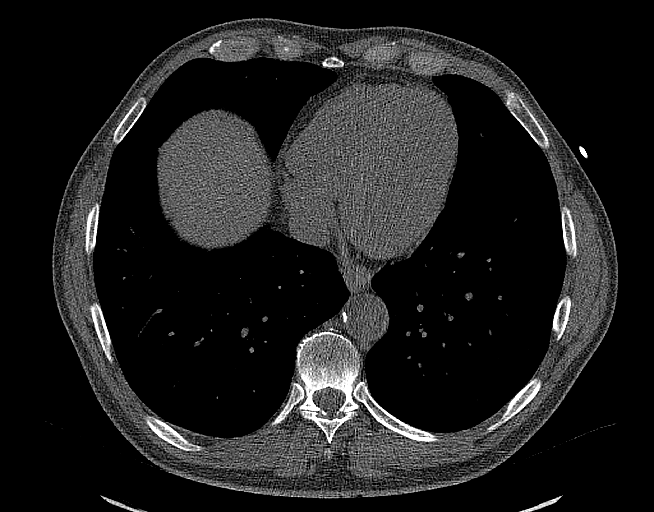
[im 40/80  vessel]
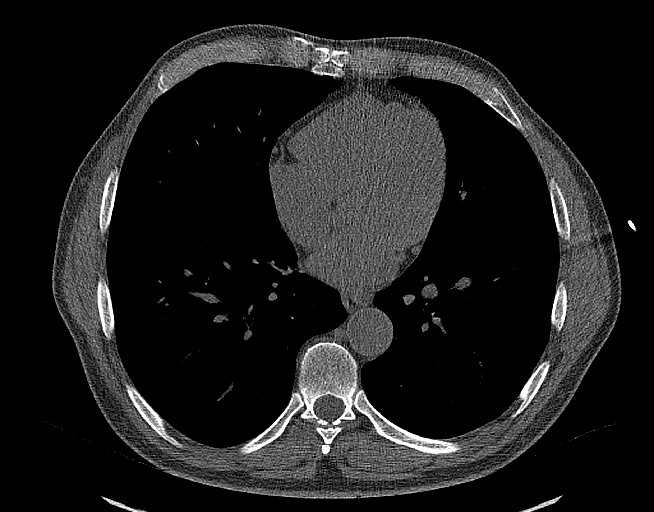
[im 53/80  vessel]
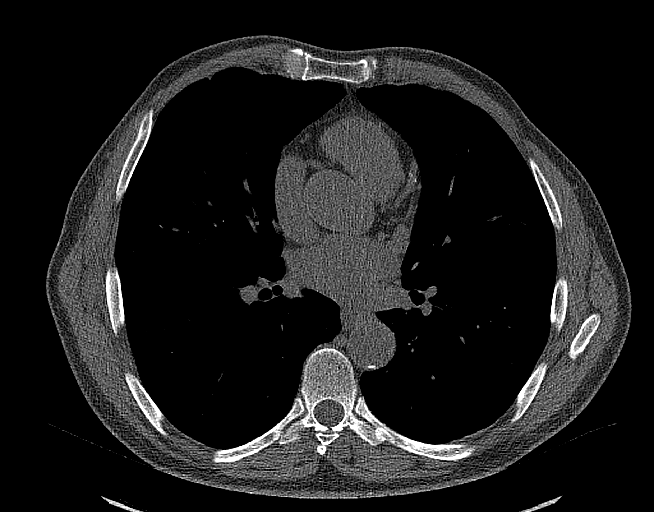
[im 66/80  vessel]
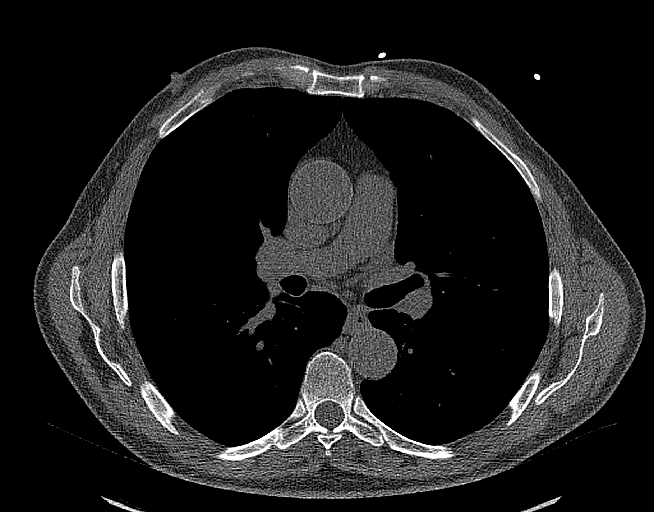

[13 of 20 positions shown; findings below may reference images not displayed]

FINDINGS: CORONARY CALCIUM SCORES:

Left Main:

LAD:

LCx: 24

RCA: 0

Total Agatston Score: 73

[HOSPITAL] percentile: 38

AORTA MEASUREMENTS:

Ascending Aorta: 36 mm

Descending Aorta: 29 mm

OTHER FINDINGS:

Cardiovascular: Calcified coronary artery disease as outlined above.

Mild dilation of the ascending thoracic aorta. No pericardial
effusion. Aortic valvular calcifications.

Mediastinum/Nodes: No adenopathy in the imaged portions of the
mediastinum.

Lungs/Pleura: Small perifissural nodules in the RIGHT chest largest
measuring 6 x 4 mm (image 22, series 9) another on image 25 of
series 9 measuring approximately 4 x 4 mm.

No consolidative change or sign of pleural effusion. Visualized
airways are patent.

Upper Abdomen: Incidental imaging of upper abdominal contents is
unremarkable.

Musculoskeletal: No acute bone finding or destructive bone process.
IMPRESSION: 1. Coronary artery calcium score of 73. This places the patient in
the 38th percentile for subjects of the same age, gender and
race/ethnicity.
2. Mild dilation of the ascending thoracic aorta, which measures up
to 3.5 cm. Recommend annual imaging followup by CTA or MRA. This
recommendation follows 6848
ACCF/AHA/AATS/ACR/ASA/SCA/HARJINDER/TUNDEY/AMAOGE/NADER Guidelines for the
Diagnosis and Management of Patients with Thoracic Aortic Disease.
Circulation.6848; 121: E266-e369. Aortic aneurysm NOS (4FL6T-QRZ.A)
3. Small perifissural nodules in the RIGHT chest largest measuring 6
x 4 mm. No routine follow-up imaging is recommended for these
intrapulmonary lymph nodes.

Aortic Atherosclerosis (4FL6T-LKB.B).

## 2022-08-06 NOTE — Patient Instructions (Addendum)
We are referring you to [facility] on [address]. I encourage you to continue to use heat, stretches and massage to the areas of tightness.  It doesn't seem like the tizanidine is helping very much.  Please discontinue this. You can use the aleve twice daily for a week, if needed.

## 2022-08-06 NOTE — Progress Notes (Signed)
Chief Complaint  Patient presents with   Neck Pain    Left sided neck pain that started flaring up last week. Pain goes in to his shoulder. Had this years ago and PT helped.    Last week he woke up with slight sore neck. Has gotten worse gradually since then. The whole neck was sore at first. The right side has loosened up, can turn to the right more. He still has left sided neck pain, and is limited in turning his head to the left.  He has pain into the upper back/trapezius muscle, and in the neck.  He tried heat, TENS unit, hasn't helped. He took 2 Aleve for the last few mornings, and took some of his wife's muscle relaxers--tizanidine . He took 1 this morning, 2 yesterday (1 BID)--not drowsy, no side effects. He is a little better than he woke up, so perhaps some slight improvement. (Wife also has flexeril, hasn't tried it).  Had a similar problem in the past, and PT helped (has gone twice in the past). Pain has never been this bad.  Denies any crunching when he turns his neck (he used to have that). No radiation of pain, no numbness, tingling or weakness.  PMH, PSH, SH reviewed  Outpatient Encounter Medications as of 08/06/2022  Medication Sig Note   Cholecalciferol (VITAMIN D) 50 MCG (2000 UT) CAPS Take 1 capsule by mouth daily.    Coenzyme Q10 (COQ10) 100 MG CAPS Take 1 capsule by mouth daily.    fish oil-omega-3 fatty acids 1000 MG capsule Take 1 g by mouth daily.    losartan (COZAAR) 100 MG tablet TAKE 1 TABLET(100 MG) BY MOUTH DAILY    omeprazole (PRILOSEC) 40 MG capsule TAKE 1 CAPSULE(40 MG) BY MOUTH DAILY    Pyridoxine HCl (VITAMIN B-6) 500 MG tablet Take 500 mg by mouth daily.    rosuvastatin (CRESTOR) 5 MG tablet Take 1 tablet (5 mg total) by mouth daily.    vitamin B-12 (CYANOCOBALAMIN) 1000 MCG tablet Take 1,000 mcg by mouth daily.    vitamin C (ASCORBIC ACID) 500 MG tablet Take 500 mg by mouth daily.    [DISCONTINUED] hydrochlorothiazide (MICROZIDE) 12.5 MG capsule      albuterol (VENTOLIN HFA) 108 (90 Base) MCG/ACT inhaler Inhale 2 puffs into the lungs every 6 (six) hours as needed for wheezing. Use 30 mins prior to exercise (Patient not taking: Reported on 08/06/2022) 08/06/2022: As needed   fluorouracil (EFUDEX) 5 % cream 1 application Externally once a day 4 weeks (Patient not taking: Reported on 08/06/2022) 08/06/2022: As needed   GEMTESA 75 MG TABS Take 75 mg by mouth daily at 2 PM. (Patient not taking: Reported on 08/06/2022) 08/06/2022: Has not taken in the last few days   mirabegron ER (MYRBETRIQ) 25 MG TB24 tablet Take 25 mg by mouth daily. (Patient not taking: Reported on 08/06/2022)    tadalafil (CIALIS) 5 MG tablet Take 5 mg by mouth daily as needed for erectile dysfunction. (Patient not taking: Reported on 08/06/2022) 08/06/2022: As needed    [DISCONTINUED] Besifloxacin HCl (BESIVANCE) 0.6 % SUSP     [DISCONTINUED] Bromfenac Sodium (PROLENSA) 0.07 % SOLN INSTILL 1 DROP IN RIGHT EYE AT BEDTIME AS DIRECTED    [DISCONTINUED] Difluprednate 0.05 % EMUL INSTILL 1 DROP IN RIGHT EYE FOUR TIMES DAILY AS DIRECTED    [DISCONTINUED] ondansetron (ZOFRAN) 4 MG tablet Zofran 4 mg tablet   4 mg by oral route.    No facility-administered encounter medications on file as  of 08/06/2022.   No Known Allergies  ROS: no fever, chills, URI symptoms, chest pain, shortness of breath. No numbness, tingling, weakness. Urinary frequency improved upon cutting back caffeine in the diet.  Gemtesa helps--currently doing a trial without it for a few days. Some changes to bowels/constipation (change in times of BM's, having to get up at night) since taking Gemtesa) ED, meds are intermittently helpful (adding cialis helped for a short while).   PHYSICAL EXAM:  BP 136/80   Pulse (!) 56   Ht  (1.727 m)   Wt 170 lb (77.1 kg)   BMI 25.85 kg/m   Well-appearing male in no distress HEENT: conjunctiva and sclera are clear, EOMI Neck: no spinal tenderness.  Trigger points are  present at the trapezius (in shoulder) and L paraspinous. ROM--Limited turning to left, improved on the right (but not quite FROM); he has pain with L ear to L shoulder. Full forward flexion Neuro: normal strength, sensation, DTR's. Skin: Large bruises and purpura to left forearm, right hand Scabs on L lower leg.  Hypopigmented area R cheek. Psych: normal mood, affect, hygiene and grooming   ASSESSMENT/PLAN:  Neck pain - muscular component. Recurrent. Has done well with PT in past. May use Aleve BID for a week. Refer for PT - Plan: Ambulatory referral to Physical Therapy  Muscle spasms of neck - some trigger points. Refer for PT. Advised not to use wife's meds--esp not flexeril. If benefitting from tizanidine, can use short-term - Plan: Ambulatory referral to Physical Therapy  Referred to [facility] Cont heat, stretches, TENS, massage

## 2022-08-07 ENCOUNTER — Encounter: Payer: Self-pay | Admitting: Family Medicine

## 2022-08-07 MED ORDER — TIZANIDINE HCL 4 MG PO TABS: 4.0000 mg | ORAL_TABLET | Freq: Three times a day (TID) | ORAL | 0 refills | Status: DC | PRN

## 2022-08-12 ENCOUNTER — Ambulatory Visit: Payer: Medicare Other | Admitting: Gastroenterology

## 2022-10-26 ENCOUNTER — Emergency Department (EMERGENCY_DEPARTMENT_HOSPITAL): Payer: Medicare Other | Admitting: Anesthesiology

## 2022-10-26 ENCOUNTER — Encounter (HOSPITAL_COMMUNITY)
Admission: EM | Disposition: A | Discharge status: Home / Self Care | Payer: Self-pay | Source: Home / Self Care | Attending: Emergency Medicine

## 2022-10-26 ENCOUNTER — Telehealth: Payer: Self-pay | Admitting: Gastroenterology

## 2022-10-26 ENCOUNTER — Emergency Department (HOSPITAL_COMMUNITY): Payer: Medicare Other | Admitting: Anesthesiology

## 2022-10-26 ENCOUNTER — Encounter (HOSPITAL_COMMUNITY): Payer: Self-pay

## 2022-10-26 ENCOUNTER — Other Ambulatory Visit: Payer: Self-pay

## 2022-10-26 ENCOUNTER — Ambulatory Visit (HOSPITAL_COMMUNITY)
Admission: EM | Admit: 2022-10-26 | Discharge: 2022-10-26 | Disposition: A | Discharge status: Home / Self Care | Payer: Medicare Other | Attending: Emergency Medicine | Admitting: Emergency Medicine

## 2022-10-26 ENCOUNTER — Emergency Department (HOSPITAL_COMMUNITY): Payer: Medicare Other

## 2022-10-26 DIAGNOSIS — T18128A Food in esophagus causing other injury, initial encounter: Secondary | ICD-10-CM

## 2022-10-26 DIAGNOSIS — K200 Eosinophilic esophagitis: Secondary | ICD-10-CM

## 2022-10-26 DIAGNOSIS — Z79899 Other long term (current) drug therapy: Secondary | ICD-10-CM | POA: Diagnosis not present

## 2022-10-26 DIAGNOSIS — I10 Essential (primary) hypertension: Secondary | ICD-10-CM | POA: Diagnosis not present

## 2022-10-26 DIAGNOSIS — Z8612 Personal history of poliomyelitis: Secondary | ICD-10-CM | POA: Insufficient documentation

## 2022-10-26 DIAGNOSIS — Z85828 Personal history of other malignant neoplasm of skin: Secondary | ICD-10-CM | POA: Insufficient documentation

## 2022-10-26 DIAGNOSIS — K449 Diaphragmatic hernia without obstruction or gangrene: Secondary | ICD-10-CM | POA: Diagnosis not present

## 2022-10-26 DIAGNOSIS — J4520 Mild intermittent asthma, uncomplicated: Secondary | ICD-10-CM | POA: Insufficient documentation

## 2022-10-26 DIAGNOSIS — I739 Peripheral vascular disease, unspecified: Secondary | ICD-10-CM | POA: Insufficient documentation

## 2022-10-26 DIAGNOSIS — R1319 Other dysphagia: Secondary | ICD-10-CM

## 2022-10-26 DIAGNOSIS — M1990 Unspecified osteoarthritis, unspecified site: Secondary | ICD-10-CM | POA: Insufficient documentation

## 2022-10-26 DIAGNOSIS — K222 Esophageal obstruction: Secondary | ICD-10-CM | POA: Diagnosis not present

## 2022-10-26 DIAGNOSIS — K219 Gastro-esophageal reflux disease without esophagitis: Secondary | ICD-10-CM | POA: Insufficient documentation

## 2022-10-26 DIAGNOSIS — J45909 Unspecified asthma, uncomplicated: Secondary | ICD-10-CM | POA: Insufficient documentation

## 2022-10-26 DIAGNOSIS — W44F3XA Food entering into or through a natural orifice, initial encounter: Secondary | ICD-10-CM

## 2022-10-26 HISTORY — PX: BIOPSY: SHX5522

## 2022-10-26 HISTORY — PX: ESOPHAGOGASTRODUODENOSCOPY: SHX5428

## 2022-10-26 HISTORY — PX: FOREIGN BODY REMOVAL: SHX962

## 2022-10-26 SURGERY — EGD (ESOPHAGOGASTRODUODENOSCOPY)
Anesthesia: General

## 2022-10-26 SURGERY — ESOPHAGOGASTRODUODENOSCOPY (EGD) WITH PROPOFOL
Anesthesia: Monitor Anesthesia Care

## 2022-10-26 MED ORDER — SODIUM CHLORIDE 0.9 % IV SOLN: INTRAVENOUS | Status: DC

## 2022-10-26 MED ORDER — LIDOCAINE 2% (20 MG/ML) 5 ML SYRINGE
INTRAMUSCULAR | Status: DC | PRN
  Administered 2022-10-26: 70 mg via INTRAVENOUS

## 2022-10-26 MED ORDER — ONDANSETRON HCL 4 MG/2ML IJ SOLN: 4.0000 mg | Freq: Once | INTRAMUSCULAR | Status: DC | PRN

## 2022-10-26 MED ORDER — OXYCODONE HCL 5 MG PO TABS: 5.0000 mg | ORAL_TABLET | Freq: Once | ORAL | Status: DC | PRN

## 2022-10-26 MED ORDER — EPHEDRINE SULFATE-NACL 50-0.9 MG/10ML-% IV SOSY
PREFILLED_SYRINGE | INTRAVENOUS | Status: DC | PRN
  Administered 2022-10-26: 10 mg via INTRAVENOUS

## 2022-10-26 MED ORDER — SUCCINYLCHOLINE CHLORIDE 200 MG/10ML IV SOSY
PREFILLED_SYRINGE | INTRAVENOUS | Status: DC | PRN
  Administered 2022-10-26: 100 mg via INTRAVENOUS

## 2022-10-26 MED ORDER — FENTANYL CITRATE (PF) 100 MCG/2ML IJ SOLN: 25.0000 ug | INTRAMUSCULAR | Status: DC | PRN

## 2022-10-26 MED ORDER — PROPOFOL 10 MG/ML IV BOLUS
INTRAVENOUS | Status: AC
  Filled 2022-10-26: qty 20

## 2022-10-26 MED ORDER — OXYCODONE HCL 5 MG/5ML PO SOLN: 5.0000 mg | Freq: Once | ORAL | Status: DC | PRN

## 2022-10-26 MED ORDER — PROPOFOL 10 MG/ML IV BOLUS
INTRAVENOUS | Status: DC | PRN
Start: 2022-10-26 — End: 2022-10-26
  Administered 2022-10-26: 150 mg via INTRAVENOUS

## 2022-10-26 MED ORDER — ONDANSETRON HCL 4 MG/2ML IJ SOLN
INTRAMUSCULAR | Status: DC | PRN
  Administered 2022-10-26: 4 mg via INTRAVENOUS

## 2022-10-26 MED ORDER — GLYCOPYRROLATE 0.2 MG/ML IJ SOLN
INTRAMUSCULAR | Status: DC | PRN
  Administered 2022-10-26: .2 mg via INTRAVENOUS

## 2022-10-26 MED ORDER — LACTATED RINGERS IV SOLN: INTRAVENOUS | Status: DC

## 2022-10-26 NOTE — Telephone Encounter (Signed)
Patient currently in ED for evaluation of food impaction.

## 2022-10-26 NOTE — Consult Note (Addendum)
Consultation  Referring Provider:   ER  Primary Care Physician:  Joselyn Arrow, MD Primary Gastroenterologist:  Dr. Myrtie Neither       Reason for Consultation:       possible food impaction         HPI:   Wayne Brandt is a 72 y.o. male with past medical history significant for peptic duodenitis, gastritis negative H. pylori, esophageal stenosis with EOE status post dilation with Dr. Kinnie Scales [facility] 11/2018, wishing to transfer care to our office called with symptoms of esophageal impaction.  11/21/2018 endoscopy with Dr. Kinnie Scales showed peptic duodenitis with focal active inflammation and erosion negative H. pylori, chronic gastritis negative H. Pylori, had dilation that time. He had last 2 EGD with anesthesia and dilation but states he has had EGD without anesthesia. Last time was 2018, he did well with the EGD but for the dilatation he had to have sedation due to gagging. Has had 2 other EGD's since that time with anesthesia.  Patient is normally on omeprazole. Was complaining of dysphagia when he called our office. Stating he ate rice with beef stew 7/14 and folic it became stuck.  Will try to drink water but have regurgitation of the water. No family was present at the time of my evaluation.  Patient states he has had dysphagia since he was a teenager, his first EGD with dilation with Dr. Kinnie Scales in the 1980s and has had them every year until his last EGD with dilation in 2020 patient was started on omeprazole 40 mg once daily and he has not needed one since. He has continued on the omeprazole 40 mg once daily without disruption, takes before eating. He denies NSAIDs, does drink 1-2 beers every single day since his 20s, [employer] light.  Denies tobacco use, drug use. Patient states he has noticed probably over the last several months he has had some worsening dysphagia, normally he can have it where something gets stuck he drinks a lot of water or vomit several times and he is able to eat  again without issues. Last night he was eating rice with beef stew when he felt it become lodged mid esophagus, he tried to drink a lot of water but would just have vomiting and still felt that it was there.  He went to bed with a sensation, was able to handle secretions, no issues with breathing. He called our office this morning discussing this and he advised to come to the ER.  Prior to coming to the ER patient went to the gym to try to see if movement would help, he was able to drink some water there which felt like it got stuck with pressure but then he did not have to regurgitate this up. He denies odynophagia, melena, hematochezia.  No abdominal pain. Has normal bowel movements. Patient states last colonoscopy 5 or 6 years ago states he is due for recall 10 years. No family history of GI cancer, esophageal cancer. Patient denies shortness of breath, chest pain, not on blood thinners, no history of heart attack, stroke.  Though states this has strong family history of heart disease.  Abnormal ED labs: Abnormal Labs Reviewed - No data to display   Past Medical History:  Diagnosis Date   Actinic keratosis    Dr. Sharyn Lull   Arthritis    wrists, L thumb   BPH (benign prostatic hyperplasia)    ED (erectile dysfunction)    Family history of premature  CAD    GERD (gastroesophageal reflux disease)    HH (hiatus hernia)    History of basal cell carcinoma excision    2013-- chest area   History of esophagogastroduodenoscopy (EGD) 12/06/2017   Dr.Medoff-distal esophageal stricture,hiatal hernia,gastritis and duodenitis.   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   S/P dilatation of esophageal stricture    multiple times   SCC (squamous cell carcinoma) 2020   face   Urinary retention     Surgical History:  He  has a past surgical history that includes Total knee arthroplasty  (Right, 02/22/2006); Cardiovascular stress test (2006); Band hemorrhoidectomy; Colonoscopy, esophagogastroduodenoscopy (egd) and esophageal dilation (02/15/2014); ORIF RIGHT PERIPROSTHEITC PATELLA FX (11/01/2006); I & D RIGHT KNEE W/ REMOVAL PATELLA WIRES (02/09/2007); Neuroplasty / transposition ulnar nerve at elbow (Right, 01/04/2008); LEFT FOOT SURGERY (x2  as child); Knee arthroscopy (Right, 07/07/2000); Ankle Fusion (Left, 1997); Wrist arthroscopy with debridement (Right, 03/30/2001); Transurethral resection of prostate (N/A, 06/08/2016); Esophageal dilation (02/08/2017); Esophagogastroduodenoscopy (02/04/2017); Esophagogastroduodenoscopy endoscopy (02/08/2017); Upper gi endoscopy (11/21/2018); Reverse shoulder arthroplasty (Right, 03/23/2019); Cataract extraction (Right, 04/08/2020); and Cataract extraction (Left, 05/20/2020). Family History:  His family history includes Alcohol abuse in his mother; Cancer in his brother and mother; Glaucoma in his brother; Heart disease (age of onset: 73) in his brother; Heart disease (age of onset: 64) in his father; Heart disease (age of onset: 68) in his brother; Hypertension in his son; Testicular cancer (age of onset: 66) in his brother. Social History:   reports that he has never smoked. He has never used smokeless tobacco. He reports current alcohol use of about 10.0 standard drinks of alcohol per week. He reports that he does not use drugs.  Prior to Admission medications   Medication Sig Start Date End Date Taking? Authorizing Provider  albuterol (VENTOLIN HFA) 108 (90 Base) MCG/ACT inhaler Inhale 2 puffs into the lungs every 6 (six) hours as needed for wheezing. Use 30 mins prior to exercise Patient not taking: Reported on 08/06/2022 04/15/22   Tysinger, Kermit Balo, PA-C  Cholecalciferol (VITAMIN D) 50 MCG (2000 UT) CAPS Take 1 capsule by mouth daily.    [provider]  Coenzyme Q10 (COQ10) 100 MG CAPS Take 1 capsule by mouth daily.    [provider]  fish oil-omega-3 fatty acids 1000 MG capsule Take 1 g by mouth daily.    [provider]  fluorouracil (EFUDEX) 5 % cream 1 application Externally once a day 4 weeks Patient not taking: Reported on 08/06/2022 06/16/22   [provider]  GEMTESA 75 MG TABS Take 75 mg by mouth daily at 2 PM. Patient not taking: Reported on 08/06/2022 04/13/22   [provider]  losartan (COZAAR) 100 MG tablet TAKE 1 TABLET(100 MG) BY MOUTH DAILY 02/16/22   Joselyn Arrow, MD  mirabegron ER (MYRBETRIQ) 25 MG TB24 tablet Take 25 mg by mouth daily. Patient not taking: Reported on 08/06/2022 01/01/22   [provider]  omeprazole (PRILOSEC) 40 MG capsule TAKE 1 CAPSULE(40 MG) BY MOUTH DAILY 11/18/21   Joselyn Arrow, MD  Pyridoxine HCl (VITAMIN B-6) 500 MG tablet Take 500 mg by mouth daily.    [provider]  rosuvastatin (CRESTOR) 5 MG tablet Take 1 tablet (5 mg total) by mouth daily. 07/16/22   Joselyn Arrow, MD  tadalafil (CIALIS) 5 MG tablet  Take 5 mg by mouth daily as needed for erectile dysfunction. Patient not taking: Reported on 08/06/2022    [provider]  tiZANidine (ZANAFLEX) 4 MG tablet Take 1 tablet (4 mg total) by mouth every 8 (eight) hours as needed for muscle spasms. 08/07/22   Joselyn Arrow, MD  vitamin B-12 (CYANOCOBALAMIN) 1000 MCG tablet Take 1,000 mcg by mouth daily.    [provider]  vitamin C (ASCORBIC ACID) 500 MG tablet Take 500 mg by mouth daily.    [provider]    No current facility-administered medications for this encounter.   Current Outpatient Medications  Medication Sig Dispense Refill   albuterol (VENTOLIN HFA) 108 (90 Base) MCG/ACT inhaler Inhale 2 puffs into the lungs every 6 (six) hours as needed for wheezing. Use 30 mins prior to exercise (Patient not taking: Reported on 08/06/2022) 18 g 0   Cholecalciferol (VITAMIN D) 50 MCG (2000 UT) CAPS Take 1 capsule by mouth daily.     Coenzyme Q10 (COQ10) 100 MG CAPS  Take 1 capsule by mouth daily.     fish oil-omega-3 fatty acids 1000 MG capsule Take 1 g by mouth daily.     fluorouracil (EFUDEX) 5 % cream 1 application Externally once a day 4 weeks (Patient not taking: Reported on 08/06/2022)     GEMTESA 75 MG TABS Take 75 mg by mouth daily at 2 PM. (Patient not taking: Reported on 08/06/2022)     losartan (COZAAR) 100 MG tablet TAKE 1 TABLET(100 MG) BY MOUTH DAILY 90 tablet 2   mirabegron ER (MYRBETRIQ) 25 MG TB24 tablet Take 25 mg by mouth daily. (Patient not taking: Reported on 08/06/2022)     omeprazole (PRILOSEC) 40 MG capsule TAKE 1 CAPSULE(40 MG) BY MOUTH DAILY 90 capsule 3   Pyridoxine HCl (VITAMIN B-6) 500 MG tablet Take 500 mg by mouth daily.     rosuvastatin (CRESTOR) 5 MG tablet Take 1 tablet (5 mg total) by mouth daily. 90 tablet 1   tadalafil (CIALIS) 5 MG tablet Take 5 mg by mouth daily as needed for erectile dysfunction. (Patient not taking: Reported on 08/06/2022)     tiZANidine (ZANAFLEX) 4 MG tablet Take 1 tablet (4 mg total) by mouth every 8 (eight) hours as needed for muscle spasms. 20 tablet 0   vitamin B-12 (CYANOCOBALAMIN) 1000 MCG tablet Take 1,000 mcg by mouth daily.     vitamin C (ASCORBIC ACID) 500 MG tablet Take 500 mg by mouth daily.      Allergies as of 10/26/2022   (No Known Allergies)    Review of Systems:    Constitutional: No weight loss, fever, chills, weakness or fatigue HEENT: Eyes: No change in vision               Ears, Nose, Throat:  No change in hearing or congestion Skin: No rash or itching Cardiovascular: No chest pain, chest pressure or palpitations   Respiratory: No SOB or cough Gastrointestinal: See HPI and otherwise negative Genitourinary: No dysuria or change in urinary frequency Neurological: No headache, dizziness or syncope Musculoskeletal: No new muscle or joint pain Hematologic: No bleeding or bruising Psychiatric: No history of depression or anxiety     Physical Exam:  Vital signs in last 24  hours: Temp:  [98 F (36.7 C)] 98 F (36.7 C) (07/15 1226) Pulse Rate:  [65] 65 (07/15 1226) Resp:  [18] 18 (07/15 1226) BP: (134)/(75) 134/75 (07/15 1226) SpO2:  [99 %] 99 % (07/15 1226) Weight:  [  73.5 kg] 73.5 kg (07/15 1226)   Last BM recorded by nurses in past 5 days No data recorded  General:   Pleasant, well developed male in no acute distress Head:  Normocephalic and atraumatic. Eyes: sclerae anicteric,conjunctive pink  Heart:  regular rate and rhythm, no murmurs or gallops Pulm: Clear anteriorly; no wheezing Abdomen:  Soft, Non-distended AB, Active bowel sounds. No tenderness , No organomegaly appreciated. Extremities:  Without edema. Msk:  Symmetrical without gross deformities. Peripheral pulses intact.  Neurologic:  Alert and  oriented x4;  No focal deficits.  Skin:   Dry and intact without significant lesions or rashes. Psychiatric:  Cooperative. Normal mood and affect.  LAB RESULTS: No results for input(s): "WBC", "HGB", "HCT", "PLT" in the last 72 hours. BMET No results for input(s): "NA", "K", "CL", "CO2", "GLUCOSE", "BUN", "CREATININE", "CALCIUM" in the last 72 hours. LFT No results for input(s): "PROT", "ALBUMIN", "AST", "ALT", "ALKPHOS", "BILITOT", "BILIDIR", "IBILI" in the last 72 hours. PT/INR No results for input(s): "LABPROT", "INR" in the last 72 hours.  STUDIES: No results found.    Impression    Dysphagia/possible food impaction/EOE History of GERD/esophageal stenosis status post dilation Last EGD with dilation 2020, negative H. pylori gastritis/duodenitis. Patient denies any NSAID use, does drink 2 beers daily for the last 20 years. Acute dysphagia with regurgitation of water yesterday evening, was able to take a slight bit of water this morning without regurgitation but continues to have the full feeling feeling.       Plan   -Protonix 40 mg IV BID.  Suggest increasing omeprazole 40 mg twice daily outpatient for 4-8 weeks -N.p.o.  now -Will plan on EGD with disimpaction.   Patient states he would prefer to have EGD without anesthesia and has had this in the past, last 3 EGDs were with anesthesia, with history of EOE, will need anesthesia -I thoroughly discussed the procedure to include nature, alternatives, benefits, and risks including but not limited to bleeding, perforation, infection, anesthesia/cardiac and pulmonary complications. Patient provides understanding and gave verbal consent to proceed.   Thank you for your kind consultation, we will continue to follow.   Doree Albee  10/26/2022, 12:45 PM     [facility] Attending   I have taken an interval history, reviewed the chart and examined the patient. I agree with the Advanced Practitioner's note, impression and recommendations.    He has probable food impaction in the setting of eosinophilic esophagitis.  Emergent EGD with removal of food impaction and possible esophageal dilation is appropriate.  The risks and benefits as well as alternatives of endoscopic procedure(s) have been discussed and reviewed. All questions answered. The patient agrees to proceed.  This needs to be done under anesthesia - explained and he consents.  Iva Boop, MD, [facility] See Loretha Stapler on call - gastroenterology for best contact person 10/26/2022 4:14 PM

## 2022-10-26 NOTE — Transfer of Care (Signed)
Immediate Anesthesia Transfer of Care Note  Patient: Wayne Brandt  Procedure(s) Performed: ESOPHAGOGASTRODUODENOSCOPY (EGD) FOREIGN BODY REMOVAL  Patient Location: PACU  Anesthesia Type:General  Level of Consciousness: awake, alert , oriented, and patient cooperative  Airway & Oxygen Therapy: Patient Spontanous Breathing and Patient connected to face mask oxygen  Post-op Assessment: Report given to RN and Post -op Vital signs reviewed and stable  Post vital signs: Reviewed and stable  Last Vitals:  Vitals Value Taken Time  BP 150/80 10/26/22 1723  Temp    Pulse 77 10/26/22 1728  Resp 14 10/26/22 1728  SpO2 100 % 10/26/22 1728  Vitals shown include unfiled device data.  Last Pain:  Vitals:   10/26/22 1625  TempSrc:   PainSc: 0-No pain         Complications: No notable events documented.

## 2022-10-26 NOTE — Telephone Encounter (Signed)
Attempt to reach patient twice. I left him a detailed vm advising him to go to [facility] ED ASAP for evaluation.

## 2022-10-26 NOTE — Discharge Instructions (Addendum)
There was a food impaction - large. Able to remove by suctioning and pushing. There was swelling and at least 2 strictures in the esophagus. I took biopsies to check the eosinophilic esophagitis.  I will contact you with results and plans. Will need to set up another endoscopy with dilation most likely.  You do not need appointment that was scheduled for tomorrow.  Stay on liquids only today and can try soft foods tomorrow.  I appreciate the opportunity to care for you. Iva Boop, MD, FACG  YOU HAD AN ENDOSCOPIC PROCEDURE TODAY: Refer to the procedure report and other information in the discharge instructions given to you for any specific questions about what was found during the examination. If this information does not answer your questions, please call Dr. Marvell Fuller office at [phone] to clarify.   YOU SHOULD EXPECT: Some feelings of bloating in the abdomen. Passage of more gas than usual. Walking can help get rid of the air that was put into your GI tract during the procedure and reduce the bloating. If you had a lower endoscopy (such as a colonoscopy or flexible sigmoidoscopy) you may notice spotting of blood in your stool or on the toilet paper. Some abdominal soreness may be present for a day or two, also.  DIET: Clear liquids until 7 PM then may try soups and full liquids. Soft foods tomorrow if ok and then slowly advance but cut foods very small.  ACTIVITY: Your care partner should take you home directly after the procedure. You should plan to take it easy, moving slowly for the rest of the day. You can resume normal activity the day after the procedure however YOU SHOULD NOT DRIVE, use power tools, machinery or perform tasks that involve climbing or major physical exertion for 24 hours (because of the sedation medicines used during the test).   SYMPTOMS TO REPORT IMMEDIATELY: A gastroenterologist can be reached at any hour. Please call [phone]  for any of the following  symptoms:   Following upper endoscopy (EGD, EUS, ERCP, esophageal dilation) Vomiting of blood or coffee ground material  New, significant abdominal pain  New, significant chest pain or pain under the shoulder blades  Painful or persistently difficult swallowing  New shortness of breath  Black, tarry-looking or red, bloody stools  FOLLOW UP:  If any biopsies were taken you will be contacted by phone or by letter within the next 1-3 weeks. Call [phone]  if you have not heard about the biopsies in 3 weeks.  Please also call with any specific questions about appointments or follow up tests.

## 2022-10-26 NOTE — Anesthesia Procedure Notes (Signed)
Procedure Name: Intubation Date/Time: 10/26/2022 4:42 PM  Performed by: Ponciano Ort, CRNAPre-anesthesia Checklist: Patient identified, Emergency Drugs available, Suction available and Patient being monitored Patient Re-evaluated:Patient Re-evaluated prior to induction Oxygen Delivery Method: Circle system utilized Preoxygenation: Pre-oxygenation with 100% oxygen Induction Type: IV induction, Rapid sequence and Cricoid Pressure applied Ventilation: Mask ventilation without difficulty Laryngoscope Size: Mac and 3 Grade View: Grade II Tube type: Oral Tube size: 7.0 mm Number of attempts: 1 Airway Equipment and Method: Stylet and Oral airway Placement Confirmation: ETT inserted through vocal cords under direct vision, positive ETCO2 and breath sounds checked- equal and bilateral Secured at: 21 cm Tube secured with: Tape Dental Injury: Teeth and Oropharynx as per pre-operative assessment

## 2022-10-26 NOTE — ED Provider Notes (Signed)
[place] EMERGENCY DEPARTMENT AT [facility] Provider Note   CSN: [id number] Arrival date & time: 10/26/22  1221     History  Chief Complaint  Patient presents with   Food Bolus    Wayne Brandt is a 72 y.o. male.  HPI   Patient presents with concern of ongoing chest discomfort. He notes a history going back years of episodic food impaction requiring esophageal dilatation. Yesterday, after eating a piece to with rice and began feeling discomfort in his upper chest.  Since that time symptoms have been persistent.  He has been able to tolerate some liquids, some solids, though with a delay feeling as though transiently things are stuck. No other complaints including other chest pain, syncope, fever.   Home Medications Prior to Admission medications   Medication Sig Start Date End Date Taking? Authorizing Provider  albuterol (VENTOLIN HFA) 108 (90 Base) MCG/ACT inhaler Inhale 2 puffs into the lungs every 6 (six) hours as needed for wheezing. Use 30 mins prior to exercise Patient not taking: Reported on 08/06/2022 04/15/22   Tysinger, Kermit Balo, PA-C  Cholecalciferol (VITAMIN D) 50 MCG (2000 UT) CAPS Take 1 capsule by mouth daily.    [provider]  Coenzyme Q10 (COQ10) 100 MG CAPS Take 1 capsule by mouth daily.    [provider]  fish oil-omega-3 fatty acids 1000 MG capsule Take 1 g by mouth daily.    [provider]  fluorouracil (EFUDEX) 5 % cream 1 application Externally once a day 4 weeks Patient not taking: Reported on 08/06/2022 06/16/22   [provider]  GEMTESA 75 MG TABS Take 75 mg by mouth daily at 2 PM. Patient not taking: Reported on 08/06/2022 04/13/22   [provider]  losartan (COZAAR) 100 MG tablet TAKE 1 TABLET(100 MG) BY MOUTH DAILY 02/16/22   Joselyn Arrow, MD  mirabegron ER (MYRBETRIQ) 25 MG TB24 tablet Take 25 mg by mouth daily. Patient not taking: Reported on 08/06/2022 01/01/22   [provider]   omeprazole (PRILOSEC) 40 MG capsule TAKE 1 CAPSULE(40 MG) BY MOUTH DAILY 11/18/21   Joselyn Arrow, MD  Pyridoxine HCl (VITAMIN B-6) 500 MG tablet Take 500 mg by mouth daily.    [provider]  rosuvastatin (CRESTOR) 5 MG tablet Take 1 tablet (5 mg total) by mouth daily. 07/16/22   Joselyn Arrow, MD  tadalafil (CIALIS) 5 MG tablet Take 5 mg by mouth daily as needed for erectile dysfunction. Patient not taking: Reported on 08/06/2022    [provider]  tiZANidine (ZANAFLEX) 4 MG tablet Take 1 tablet (4 mg total) by mouth every 8 (eight) hours as needed for muscle spasms. 08/07/22   Joselyn Arrow, MD  vitamin B-12 (CYANOCOBALAMIN) 1000 MCG tablet Take 1,000 mcg by mouth daily.    [provider]  vitamin C (ASCORBIC ACID) 500 MG tablet Take 500 mg by mouth daily.    [provider]      Allergies    Patient has no known allergies.    Review of Systems   Review of Systems  All other systems reviewed and are negative.   Physical Exam Updated Vital Signs BP 134/75   Pulse 65   Temp 98 F (36.7 C) (Oral)   Resp 18   Ht 5\' 8"  (1.727 m)   Wt 73.5 kg   SpO2 99%   BMI 24.63 kg/m  Physical Exam Vitals and nursing note reviewed.  Constitutional:      General:  He is not in acute distress.    Appearance: He is well-developed.  HENT:     Head: Normocephalic and atraumatic.  Eyes:     Extraocular Movements: EOM normal.     Conjunctiva/sclera: Conjunctivae normal.  Cardiovascular:     Rate and Rhythm: Regular rhythm.  Pulmonary:     Effort: Pulmonary effort is normal. No respiratory distress.     Breath sounds: No stridor.  Abdominal:     General: There is no distension.  Musculoskeletal:        General: No edema.  Skin:    General: Skin is warm and dry.  Neurological:     Mental Status: He is alert and oriented to person, place, and time.  Psychiatric:        Mood and Affect: Mood and affect normal.     ED Results / Procedures / Treatments    Labs (all labs ordered are listed, but only abnormal results are displayed) Labs Reviewed - No data to display  EKG None  Radiology DG Chest 2 View  Result Date: 10/26/2022 CLINICAL DATA:  Chest pain. EXAM: CHEST - 2 VIEW COMPARISON:  November 20, 2021. FINDINGS: The heart size and mediastinal contours are within normal limits. Both lungs are clear. Status post right shoulder arthroplasty. IMPRESSION: No active cardiopulmonary disease. Electronically Signed   By: Lupita Raider M.D.   On: 10/26/2022 13:33    Procedures Procedures    Medications Ordered in ED Medications - No data to display  ED Course/ Medical Decision Making/ A&P                             Medical Decision Making Patient presents with possible food impaction given the description of symptoms of chest discomfort, following ingestion of beef, rice yesterday. Patient is not hypoxic, tachycardic, is in no distress, but has persistent difficulty with swallowing. I discussed the patient's case at bedside with our GI team, and the patient will proceed to endoscopy. X-ray performed, reviewed, no notable findings.  Amount and/or Complexity of Data Reviewed Radiology: ordered and independent interpretation performed. Decision-making details documented in ED Course.  Risk Decision regarding hospitalization.   2:37 PM Patient remains in similar condition, ambulatory, with persistent pain in his chest.        Final Clinical Impression(s) / ED Diagnoses Final diagnoses:  Esophageal obstruction due to food impaction     Gerhard Munch, MD 10/26/22 1438

## 2022-10-26 NOTE — Telephone Encounter (Signed)
Inbound call from patient stating he has had food stuck in his throat since yesterday 7/14. Stated he has not been able to drink and eat anything since then. Requesting a call back to discuss if something could be done to help before tomorrow's office visit 7/16. Please advise, thank you.

## 2022-10-26 NOTE — Anesthesia Preprocedure Evaluation (Addendum)
Anesthesia Evaluation  Patient identified by MRN, date of birth, ID band Patient awake    Reviewed: Allergy & Precautions, NPO status , Patient's Chart, lab work & pertinent test results  History of Anesthesia Complications Negative for: history of anesthetic complications  Airway Mallampati: II  TM Distance: >3 FB Neck ROM: Full    Dental  (+) Dental Advisory Given, Teeth Intact   Pulmonary asthma    Pulmonary exam normal        Cardiovascular hypertension, Pt. on medications + Peripheral Vascular Disease  Normal cardiovascular exam   '23 TTE - EF 60 to 65%. Right atrial size was mildly dilated. Trivial mitral valve regurgitation. Aortic valve regurgitation is trivial. There is mild dilatation of the ascending aorta, measuring 38 mm.     Neuro/Psych  Neuromuscular disease (polio)  negative psych ROS   GI/Hepatic Neg liver ROS, hiatal hernia,GERD  Medicated and Controlled,, Food impaction Hx previous dilatations    Endo/Other  negative endocrine ROS    Renal/GU negative Renal ROS     Musculoskeletal  (+) Arthritis ,    Abdominal   Peds  Hematology negative hematology ROS (+)   Anesthesia Other Findings   Reproductive/Obstetrics                             Anesthesia Physical Anesthesia Plan  ASA: 3  Anesthesia Plan: General   Post-op Pain Management: Minimal or no pain anticipated   Induction: Intravenous, Rapid sequence and Cricoid pressure planned  PONV Risk Score and Plan: 2 and Treatment may vary due to age or medical condition and Ondansetron  Airway Management Planned: Oral ETT  Additional Equipment: None  Intra-op Plan:   Post-operative Plan: Extubation in OR  Informed Consent: I have reviewed the patients History and Physical, chart, labs and discussed the procedure including the risks, benefits and alternatives for the proposed anesthesia with the patient or  authorized representative who has indicated his/her understanding and acceptance.     Dental advisory given  Plan Discussed with: CRNA and Anesthesiologist  Anesthesia Plan Comments:         Anesthesia Quick Evaluation

## 2022-10-26 NOTE — Anesthesia Postprocedure Evaluation (Signed)
Anesthesia Post Note  Patient: Wayne Brandt  Procedure(s) Performed: ESOPHAGOGASTRODUODENOSCOPY (EGD) FOREIGN BODY REMOVAL     Patient location during evaluation: PACU Anesthesia Type: General Level of consciousness: awake and alert Pain management: pain level controlled Vital Signs Assessment: post-procedure vital signs reviewed and stable Respiratory status: spontaneous breathing, nonlabored ventilation and respiratory function stable Cardiovascular status: stable and blood pressure returned to baseline Anesthetic complications: no   No notable events documented.  Last Vitals:  Vitals:   10/26/22 1745 10/26/22 1754  BP: 123/84 124/72  Pulse: (!) 59 60  Resp: 13 20  Temp:  (!) 36.4 C  SpO2: 100% 100%    Last Pain:  Vitals:   10/26/22 1754  TempSrc: Oral  PainSc: 0-No pain                 Beryle Lathe

## 2022-10-26 NOTE — Telephone Encounter (Signed)
Returned call to patient, pt states that he ate rice and beef stew yesterday. Feels like the beef is stuck. He states that he can get a little water down, but then he will have to "throw it back up". I told pt that typically with these symptoms we send him to the ER. Pt is not having any difficulty breathing. He is not a diabetic and not on any anticoagulation. Pt wanted to know if EGD with dilation could be done in the [facility]. Pt states that he has had dilations in the past with Dr. Kinnie Scales. He is scheduled for a new patient appointment with you tomorrow. Please advise, thanks!

## 2022-10-26 NOTE — ED Triage Notes (Signed)
Patient had rice and beef stew for dinner last night. Feels like a piece is still stuck in his throat. Oxygen is 98% in triage. Not in distress in triage.

## 2022-10-26 NOTE — Op Note (Signed)
[facility] Patient Name: Wayne Brandt Procedure Date: 10/26/2022 MRN: [id number] Attending MD: Iva Boop , MD, [id number] Date of Birth: [birth year] CSN: [id number] Age: 72 Admit Type: Inpatient Procedure:                Upper GI endoscopy Indications:              Dysphagia, Foreign body in the esophagus Food                            impaction in patient w/ hx of eosinophilic                            esophagitis s/p numerous esophageal dilations w/                            Dr. Kinnie Scales Providers:                Iva Boop, MD, Fransisca Connors, Salley Scarlet, Technician, Ponciano Ort, CRNA Referring MD:              Medicines:                General Anesthesia Complications:            No immediate complications. Estimated Blood Loss:     Estimated blood loss was minimal. Procedure:                Pre-Anesthesia Assessment:                           - Prior to the procedure, a History and Physical                            was performed, and patient medications and                            allergies were reviewed. The patient's tolerance of                            previous anesthesia was also reviewed. The risks                            and benefits of the procedure and the sedation                            options and risks were discussed with the patient.                            All questions were answered, and informed consent                            was obtained. Prior Anticoagulants: The patient has  taken no anticoagulant or antiplatelet agents. ASA                            Grade Assessment: III - A patient with severe                            systemic disease. After reviewing the risks and                            benefits, the patient was deemed in satisfactory                            condition to undergo the procedure.                           After obtaining  informed consent, the endoscope was                            passed under direct vision. Throughout the                            procedure, the patient's blood pressure, pulse, and                            oxygen saturations were monitored continuously. The                            [id number]-H190 ([id number]) Olympus endoscope was introduced                            through the mouth, and advanced to the body of the                            stomach. The upper GI endoscopy was somewhat                            difficult due to presence of food. The patient                            tolerated the procedure well. Scope In: Scope Out: Findings:      Food was found in the lower third of the esophagus. removed w/ cap       suction and advancing to stomach, aided by snare chopping the bolus      One benign-appearing, intrinsic moderate (circumferential scarring or       stenosis; an endoscope may pass) stenosis was found in the distal       esophagus.      One benign-appearing, intrinsic mild (non-circumferential scarring)       stenosis was found in the upper third of the esophagus. The stenosis was       traversed.      No gross lesions were noted in the cardia, in the gastric fundus and in       the gastric body.      Biopsies were obtained from the proximal and distal  esophagus with cold       forceps for histology of suspected eosinophilic esophagitis. Estimated       blood loss was minimal. Impression:               - Food in the lower third of the esophagus.                           - Benign-appearing esophageal stenosis. distal                            esophagus - w/ edema from food impaction                           - Benign-appearing esophageal stenosis. partial                            ring in upper esophagus                           - No gross lesions in the cardia, in the gastric                            fundus and in the gastric body.                           -  Biopsies were taken with a cold forceps for                            evaluation of eosinophilic esophagitis. Has hx of                            this - some features present on exam today though                            mild changes. Moderate Sedation:      Not Applicable - Patient had care per Anesthesia. Recommendation:           - Patient has a contact number available for                            emergencies. The signs and symptoms of potential                            delayed complications were discussed with the                            patient. Return to normal activities tomorrow.                            Written discharge instructions were provided to the                            patient.                           -  Clear liquids until 7 PM and then try full                            liquids if ok. soft foods tomorrow then may try                            solid foods but cut small and chew well.                           I will call him with path results and arrange                            repeat EGD and esophageal dilation and if EoE                            present consider ingested budesonide Tx and also                            food group elimination trials Procedure Code(s):        --- Professional ---                           [phone], Esophagoscopy, flexible, transoral; with                            removal of foreign body(s)                           43202, Esophagoscopy, flexible, transoral; with                            biopsy, single or multiple Diagnosis Code(s):        --- Professional ---                           U04.540J, Food in esophagus causing other injury,                            initial encounter                           K22.2, Esophageal obstruction                           R13.10, Dysphagia, unspecified                           T18.108A, Unspecified foreign body in esophagus                            causing other  injury, initial encounter CPT copyright 2022 [employer] Association. All rights reserved. The codes documented in this report are preliminary and upon coder review may  be revised to meet current compliance requirements. Iva Boop, MD 10/26/2022 5:24:05 PM This report has been signed electronically. Number of Addenda: 0

## 2022-10-26 NOTE — Telephone Encounter (Signed)
This patient almost certainly has a food impaction, which absolutely cannot be dealt with either in our office or the [facility].  It is an urgent matter that requires care in the hospital.  He needs to proceed to the [facility] Emergency Department immediately.  I will send a chat message to our hospital team so they will be aware.  - HD

## 2022-10-26 NOTE — Telephone Encounter (Signed)
This patient will have an upper endoscopy today for his food impaction.  His office appointment for 10/27/2022 can be canceled.  - HD

## 2022-10-27 ENCOUNTER — Encounter (HOSPITAL_COMMUNITY): Payer: Self-pay | Admitting: Internal Medicine

## 2022-10-27 ENCOUNTER — Ambulatory Visit: Payer: Medicare Other | Admitting: Gastroenterology

## 2022-10-27 NOTE — Telephone Encounter (Addendum)
10/27/22 office appt cancelled, pt notified. Pt states that he is doing better today.

## 2022-11-02 LAB — SURGICAL PATHOLOGY

## 2022-11-05 ENCOUNTER — Other Ambulatory Visit: Payer: Self-pay

## 2022-11-05 DIAGNOSIS — R1310 Dysphagia, unspecified: Secondary | ICD-10-CM

## 2022-11-13 ENCOUNTER — Other Ambulatory Visit: Payer: Self-pay | Admitting: Family Medicine

## 2022-11-13 DIAGNOSIS — K2970 Gastritis, unspecified, without bleeding: Secondary | ICD-10-CM

## 2022-11-16 ENCOUNTER — Other Ambulatory Visit: Payer: Self-pay | Admitting: Family Medicine

## 2022-11-17 ENCOUNTER — Other Ambulatory Visit: Payer: Self-pay | Admitting: Family Medicine

## 2022-11-17 DIAGNOSIS — K2970 Gastritis, unspecified, without bleeding: Secondary | ICD-10-CM

## 2022-11-17 MED ORDER — OMEPRAZOLE 40 MG PO CPDR
40.0000 mg | DELAYED_RELEASE_CAPSULE | Freq: Every day | ORAL | 0 refills | Status: AC
Start: 2022-11-17 — End: ?

## 2022-11-17 MED ORDER — LOSARTAN POTASSIUM 100 MG PO TABS: ORAL_TABLET | ORAL | 0 refills | Status: DC

## 2023-01-06 ENCOUNTER — Encounter: Payer: Medicare Other | Admitting: Internal Medicine

## 2023-01-07 ENCOUNTER — Other Ambulatory Visit: Payer: Medicare Other

## 2023-01-07 DIAGNOSIS — Z125 Encounter for screening for malignant neoplasm of prostate: Secondary | ICD-10-CM

## 2023-01-07 DIAGNOSIS — E871 Hypo-osmolality and hyponatremia: Secondary | ICD-10-CM

## 2023-01-07 DIAGNOSIS — I700 Atherosclerosis of aorta: Secondary | ICD-10-CM

## 2023-01-07 DIAGNOSIS — I10 Essential (primary) hypertension: Secondary | ICD-10-CM

## 2023-01-07 DIAGNOSIS — Z5181 Encounter for therapeutic drug level monitoring: Secondary | ICD-10-CM

## 2023-01-07 DIAGNOSIS — D649 Anemia, unspecified: Secondary | ICD-10-CM

## 2023-01-07 LAB — CBC WITH DIFFERENTIAL/PLATELET
Basophils Absolute: 0 10*3/uL (ref 0.0–0.2)
Basos: 0 %
EOS (ABSOLUTE): 0.2 10*3/uL (ref 0.0–0.4)
Eos: 5 %
Hematocrit: 35 % — ABNORMAL LOW (ref 37.5–51.0)
Hemoglobin: 11.8 g/dL — ABNORMAL LOW (ref 13.0–17.7)
Immature Grans (Abs): 0 10*3/uL (ref 0.0–0.1)
Immature Granulocytes: 0 %
Lymphocytes Absolute: 1.2 10*3/uL (ref 0.7–3.1)
Lymphs: 26 %
MCH: 32.1 pg (ref 26.6–33.0)
MCHC: 33.7 g/dL (ref 31.5–35.7)
MCV: 95 fL (ref 79–97)
Monocytes Absolute: 0.5 10*3/uL (ref 0.1–0.9)
Monocytes: 11 %
Neutrophils Absolute: 2.6 10*3/uL (ref 1.4–7.0)
Neutrophils: 58 %
Platelets: 197 10*3/uL (ref 150–450)
RBC: 3.68 x10E6/uL — ABNORMAL LOW (ref 4.14–5.80)
RDW: 12.3 % (ref 11.6–15.4)
WBC: 4.6 10*3/uL (ref 3.4–10.8)

## 2023-01-07 LAB — COMPREHENSIVE METABOLIC PANEL
ALT: 18 IU/L (ref 0–44)
AST: 37 IU/L (ref 0–40)
Albumin: 4 g/dL (ref 3.8–4.8)
Alkaline Phosphatase: 102 IU/L (ref 44–121)
BUN/Creatinine Ratio: 14 (ref 10–24)
BUN: 11 mg/dL (ref 8–27)
Bilirubin Total: 0.4 mg/dL (ref 0.0–1.2)
CO2: 23 mmol/L (ref 20–29)
Calcium: 9 mg/dL (ref 8.6–10.2)
Chloride: 92 mmol/L — ABNORMAL LOW (ref 96–106)
Creatinine, Ser: 0.81 mg/dL (ref 0.76–1.27)
Globulin, Total: 2.7 g/dL (ref 1.5–4.5)
Glucose: 84 mg/dL (ref 70–99)
Potassium: 4.5 mmol/L (ref 3.5–5.2)
Sodium: 128 mmol/L — ABNORMAL LOW (ref 134–144)
Total Protein: 6.7 g/dL (ref 6.0–8.5)
eGFR: 93 mL/min/{1.73_m2} (ref >59)

## 2023-01-07 LAB — LIPID PANEL
Chol/HDL Ratio: 1.8 ratio (ref 0.0–5.0)
Cholesterol, Total: 139 mg/dL (ref 100–199)
HDL: 78 mg/dL (ref >39)
LDL Chol Calc (NIH): 51 mg/dL (ref 0–99)
Triglycerides: 44 mg/dL (ref 0–149)
VLDL Cholesterol Cal: 10 mg/dL (ref 5–40)

## 2023-01-07 LAB — PSA

## 2023-01-09 DIAGNOSIS — K2100 Gastro-esophageal reflux disease with esophagitis, without bleeding: Secondary | ICD-10-CM | POA: Insufficient documentation

## 2023-01-09 NOTE — Patient Instructions (Incomplete)
HEALTH MAINTENANCE RECOMMENDATIONS:  It is recommended that you get at least 30 minutes of aerobic exercise at least 5 days/week (for weight loss, you may need as much as 60-90 minutes). This can be any activity that gets your heart rate up. This can be divided in 10-15 minute intervals if needed, but try and build up your endurance at least once a week.  Weight bearing exercise is also recommended twice weekly.  Eat a healthy diet with lots of vegetables, fruits and fiber.  "Colorful" foods have a lot of vitamins (ie green vegetables, tomatoes, red peppers, etc).  Limit sweet tea, regular sodas and alcoholic beverages, all of which has a lot of calories and sugar.  Up to 2 alcoholic drinks daily may be beneficial for men (unless trying to lose weight, watch sugars).  Drink a lot of water.  Sunscreen of at least SPF 30 should be used on all sun-exposed parts of the skin when outside between the hours of 10 am and 4 pm (not just when at beach or pool, but even with exercise, golf, tennis, and yard work!)  Use a sunscreen that says "broad spectrum" so it covers both UVA and UVB rays, and make sure to reapply every 1-2 hours.  Remember to change the batteries in your smoke detectors when changing your clock times in the spring and fall.  Carbon monoxide detectors are recommended for your home.  Use your seat belt every time you are in a car, and please drive safely and not be distracted with cell phones and texting while driving.    Mr. Derossett , Thank you for taking time to come for your Medicare Wellness Visit. I appreciate your ongoing commitment to your health goals. Please review the following plan we discussed and let me know if I can assist you in the future.   This is a list of the screening recommended for you and due dates:  Health Maintenance  Topic Date Due   Zoster (Shingles) Vaccine (1 of 2) 08/12/1969   Flu Shot  11/12/2022   COVID-19 Vaccine (4 - 2023-24 season) 12/13/2022    Medicare Annual Wellness Visit  01/11/2024   Colon Cancer Screening  02/16/2024   DTaP/Tdap/Td vaccine (6 - Td or Tdap) 07/22/2027   Pneumonia Vaccine  Completed   Hepatitis C Screening  Completed   HPV Vaccine  Aged Out   I recommend getting the Prevnar-20 vaccine.  This is the newest pneumonia vaccine, which should be the last vaccine that you need.  I recommend getting the RSV vaccine from the pharmacy this Fall (waiting 2 weeks from today's vaccine)  I recommend getting the new shingles vaccine (Shingrix). Since you have Medicare, you will need to get this from the pharmacy, as it is covered by Part D. This is a series of 2 injections, spaced 2 months apart.   This should be separated from other vaccines by at least 2 weeks.  Please bring [place] copies of your Living Will and Healthcare Power of Attorney once completed and notarized so that it can be scanned into your medical chart.  We are checking some iron studies (from the blood drawn last week); if there is iron deficiency, as I suspect, then we will have you start taking some iron.  The question is where the losses are coming from. I recommend that you follow up with Dr. Leone Payor as an office visit (not just the procedure), to further discuss your concerns. Based on the findings of inflammation in  the esophagus, I don't think the omeprazole 40 mg once daily is adequately treating your reflux. Increase to taking it twice daily. I'm sending in a month supply. You should address this with Dr. Leone Payor, as I'm unsure if he will want you to continue this versus switch to something else. I will forward him the additional labs, so that you guys can decide if/what the next step is.  Let me know if your vitamin D dose is different from the 2000 IU that we have listed in your chart.  Leslye Peer can cause constipation. Taking a fiber supplement and stool softener can help keep the bowels normal.  If these don't work, using Miralax more regularly can  also help. You can try these if you'd like to get a better response from the [facility] by being able to take it more frequently (daily).  I recommend following up with your orthopedist for the pain in the knee that has been replaced.

## 2023-01-09 NOTE — Progress Notes (Unsigned)
No chief complaint on file.   Wayne Brandt is a 72 y.o. male who presents for annual physical exam Medicare wellness visit and follow-up on chronic medical conditions.  See below for labs done prior to his visit.  He was last seen by me in April with neck pain.  He had ER visit in July for esophageal obstruction due to food impaction.  He had EGD done by Dr. Leone Payor on 7/15 showing: - Food in the lower third of the esophagus.  - Benign- appearing esophageal stenosis. distal esophagus - w/ edema from food impaction - Benign- appearing esophageal stenosis. partial ring in upper esophagus - No gross lesions in the cardia, in the gastric fundus and in the gastric body. Biopsies taken were c/w GERD, not eosinophilic esophagitis.  EGD with dilation was scheduled for last week, but cancelled.   He is compliant with omeprazole 40 mg daily. He reports he is feeling better, no recurrent dysphagia.*** UPDATE  He has a reported h/o eosinophilic esophagitis, previously under the care of Dr. Kinnie Scales.  Has had multiple EGD and dilatations in th past.  Hiatal hernia was noted on CTA done 12/2020.   Hypertension:  He is compliant with Losartan 100mg  daily, and denies side effects.  BPs are running    BP monitor has been verified as accurate (12/2019). He denies headaches, dizziness, chest pain, shortness of breath, swelling.  BP Readings from Last 3 Encounters:  10/26/22 124/72  08/06/22 136/80  02/26/22 130/72    H/o RAD/asthma. He usually only has problems at Christmas (when around pets at his family's house), though not the last few years.  He uses an inhaler prn with good results.  He currently denies any wheezing or shortness of breath.     Aortic atherosclerosis:  Noted on CXR in 2017. He initially declined statin treatment. He was sent for CT cardiac scoring in 12/2019 due to strong FH heart disease and atherosclerosis. This showed Ca score of 73, 38th percentile. Mild dilation of  ascending thoracic aorta was noted (measuring up to 3.5cm).  Annual imaging by CTA or MRA was recommended.  He had f/u CTA in 12/2020: IMPRESSION: 1. No evidence of thoracic aortic aneurysm or dissection. 2. Coronary calcifications.  Aortic Atherosclerosis (ICD10-I70.0).   He was started on Crestor 5mg  daily after coronary scan in 2021. At one point he cut back Crestor to 3x/week.  At one point he thought he had more joint pain when taking it daily, so had cut back to every other day; he has since resumed taking it daily, without any worsening of pain.   Vitamin D deficiency (mild):  Level was low at 27 lin 2016; last check was 47.3 in 11/2021 when he had been taking 2000 IU daily, but had run out a few weeks prior to the test. He is currently taking 2000 IU daily.   BPH: s/p TURP 05/2016 by Dr. Vernie Ammons. He gets up 3-4x/night, contributed by drinking tea during the day, beer in the evenings. He is able to get back to sleep.  He is now on [place]. UPDATE***  Peyronie's disease and erectile issues.  Asymptomatic with respect to Peyronie's. No treatment was recommended.  He sees urologist, last visit in 11/2022.  He is now on Trimix.   Immunization History  Administered Date(s) Administered   Fluad Quad(high Dose 65+) 02/20/2019, 12/21/2019, 03/19/2022   Influenza Whole 01/05/2008   Influenza, High Dose Seasonal PF 02/06/2016, 06/21/2017, 02/16/2018   Influenza,inj,Quad PF,6+ Mos 02/01/2013, 05/28/2015, 02/19/2021  Influenza,inj,quad, With Preservative 02/20/2019, 03/13/2020   PFIZER(Purple Top)SARS-COV-2 Vaccination 05/20/2019, 06/13/2019, 02/29/2020   Pneumococcal Conjugate-13 07/04/2015   Pneumococcal Polysaccharide-23 06/01/2007, 07/09/2016   Td 06/20/1997, 07/21/2017   Td (Adult), 2 Lf Tetanus Toxid, Preservative Free 06/20/1997, 07/21/2017   Tdap 01/05/2008   Zoster, Live 02/25/2012   Last colonoscopy: 02/2014; pt states told 10 years Last PSA: Lab Results  Component Value Date    PSA1 0.3 01/07/2023   PSA1 0.3 12/11/2021   PSA1 0.3 12/04/2020   PSA 0.85 07/04/2015   PSA 0.73 06/28/2014   PSA 1.43 06/26/2013   Dentist: twice yearly   Ophtho: yearly Exercise: daily (elliptical, bike, rowing machine) for 20-30 minutes every day; also playing tennis, golf.  Occasional weights (limited since he tweaked his elbow).   Patient Care Team: Joselyn Arrow, MD as PCP - General (Family Medicine) ENT: Dr. Jenne Pane (and his PA, Scot Jun) Urologist: Dr. Lafonda Mosses Dentist: Dr. Arlyce Dice Dermatologist: Dr. Sharyn Lull Cardiologist: Dr. Antoine Poche GI: Dr. Kinnie Scales (retired) Ortho: Dr. Lequita Halt, Dr. August Saucer (for shoulder), Dr. Mina Marble, Dr. Rennis Chris (R shoulder)  Depression Screening: Flowsheet Row Office Visit from 12/17/2021 in [facility]  PHQ-2 Total Score 0        Falls screen:     08/06/2022    2:23 PM 12/17/2021    8:35 AM 11/03/2021   10:50 AM 08/11/2021   10:00 AM 07/09/2021    9:30 AM  Fall Risk   Falls in the past year? 0 0 0 0 0  Number falls in past yr: 0 0 0 0 0  Injury with Fall? 0 0 0 0 0  Risk for fall due to : No Fall Risks No Fall Risks No Fall Risks  No Fall Risks  Follow up Falls evaluation completed Falls evaluation completed Falls evaluation completed  Falls evaluation completed     Functional Status Survey:         He doesn't have Living Will or Healthcare power of attorney. He has been given paperwork in the past.    PMH, PSH, SH and FH were reviewed and updated    ROS: The patient denies anorexia, fever, headaches, vision loss, hoarseness, chest pain, palpitations, dizziness, dyspnea on exertion, swelling, nausea, vomiting, diarrhea, abdominal pain, melena, hematochezia, hematuria, dysuria, weakness, tremor, suspicious skin lesions (sees derm, went recently), depression, anxiety, abnormal bleeding or enlarged lymph nodes    Up 2-5x/night to void. Denies hesitancy, weakened stream. Has some dribbling at the end. Denies dysuria. No rectal  bleeding since getting hemorrhoidal banding   Occasional tingling R hand from ulnar nerve issue (chronic)--intermittent, overall doing better. Taking B6 has helped a lot.  Tingling is worse in the cold weather.  Rare dysphagia (if eats too fast, certain foods); denies heartburn. ***UPDATE  Tinnitus; hearing and plugging improved with steroids, flonase, valsalva. Sees dermatology regularly (yesterday), AK's treated. Easy bruising on arms, no bleeding.   PHYSICAL EXAM:  There were no vitals taken for this visit.  Wt Readings from Last 3 Encounters:  10/26/22 162 lb (73.5 kg)  08/06/22 170 lb (77.1 kg)  04/15/22 161 lb (73 kg)    General Appearance:    Alert, cooperative, no distress, appears stated age.    Head:    Normocephalic, atraumatic  Eyes:    PERRL, conjunctiva/corneas clear, EOM's intact, fundi benign     Ears:    TM and EAC normal on the right.  Effusion on the left (TM appears slightly white as well, broad light reflex ***  Nose:  No drainage, mild mucosa edema on the left; sinuses nontender  Throat:    Normal mucosa, no lesions  Neck:    Supple, no lymphadenopathy; thyroid: no enlargement/ tenderness/ nodules; no carotid bruit or JVD     Back:    Spine nontender, no curvature, ROM normal, no CVA tenderness     Lungs:    Clear to auscultation bilaterally without wheezes, rales or ronchi; respirations unlabored     Chest Wall:    No tenderness or deformity     Heart:    Regular rate and rhythm, S1 and S2 normal, no murmur, rub or gallop     Breast Exam:    No chest wall tenderness, masses or gynecomastia     Abdomen:    Soft, non-tender, nondistended, normoactive bowel sounds, no masses, no hepatosplenomegaly     Genitalia:    Declined; pt sees urologist  Rectal:    Patient declines exam today  Extremities:    No clubbing, cyanosis or edema. +leg length discrepancy--left is shorter than right leg (since L ankle fusion per pt). L ankle fused; atrophy of LLE WHSS R knee,  mildly swollen, warm, no erythema, nontender (chronic, unchanged from prior exams).  WHSS and swelling/effusion at L ankle. Thickening and onycholytic changes to most nails, R worse than L. Bulge (popeye deformity) noted in RUE with contraction of biceps muscle  Pulses:    2+ and symmetric all extremities    Skin:    Skin color, texture, turgor normal, no rashes. Actinic damage throughout. AK's on ears. Purpura/bruising on hands/forearms.   Lymph nodes:    Cervical, supraclavicular, and inguinal nodes normal     Neurologic:    Normal strength, sensation and gait; reflexes 2+ and symmetric throughout                   Psych:   Normal mood, affect, hygiene and grooming  ***UPDATE ears, skin, extremity exam  Lab Results  Component Value Date   WBC 4.6 01/07/2023   HGB 11.8 (L) 01/07/2023   HCT 35.0 (L) 01/07/2023   MCV 95 01/07/2023   PLT 197 01/07/2023   Lab Results  Component Value Date   CHOL 139 01/07/2023   HDL 78 01/07/2023   LDLCALC 51 01/07/2023   TRIG 44 01/07/2023   CHOLHDL 1.8 01/07/2023   PSA 0.3 Fasting glucose 84    Chemistry      Component Value Date/Time   NA 128 (L) 01/07/2023 0827   K 4.5 01/07/2023 0827   CL 92 (L) 01/07/2023 0827   CO2 23 01/07/2023 0827   BUN 11 01/07/2023 0827   CREATININE 0.81 01/07/2023 0827   CREATININE 0.77 07/09/2016 0843      Component Value Date/Time   CALCIUM 9.0 01/07/2023 0827   ALKPHOS 102 01/07/2023 0827   AST 37 01/07/2023 0827   ALT 18 01/07/2023 0827   BILITOT 0.4 01/07/2023 0827       ASSESSMENT/PLAN:  Flu, COVID Prevnar-20 (offer today if declines COVID) RSV from pharmacy if he hasn't had Has he had Shingrix from pharmacy?   Recommended at least 30 minutes of aerobic activity at least 5 days/week, weight-bearing exercise at least 2x/wk; proper sunscreen use reviewed; healthy diet and alcohol recommendations (less than or equal to 2 drinks/day) reviewed; regular seatbelt use; changing batteries in smoke  detectors. Immunization recommendations discussed--continue high dose flu shots yearly  Prevnar-20 COVID booster Shingrix recommended, to get from pharmacy, risks/SE reviewed.  RSV vaccine also recommended,  to get from the pharmacy.  Colonoscopy recommendations reviewed--UTD.     Most form reviewed/completed, Full Code, Full care Patient reminded to do healthcare power of attorney and living will, and get [place] copies once notarized/completed. New forms given.  F/u 1 year, sooner if BP's elevated   Medicare Attestation I have personally reviewed: The patient's medical and social history Their use of alcohol, tobacco or illicit drugs Their current medications and supplements The patient's functional ability including ADLs,fall risks, home safety risks, cognitive, and hearing and visual impairment Diet and physical activities Evidence for depression or mood disorders  The patient's weight, height, BMI have been recorded in the chart.  I have made referrals, counseling, and provided education to the patient based on review of the above and I have provided the patient with a written personalized care plan for preventive services.

## 2023-01-11 ENCOUNTER — Telehealth: Payer: Self-pay | Admitting: Family Medicine

## 2023-01-11 ENCOUNTER — Ambulatory Visit: Payer: Medicare Other | Admitting: Family Medicine

## 2023-01-11 ENCOUNTER — Encounter: Payer: Self-pay | Admitting: Family Medicine

## 2023-01-11 VITALS — BP 130/70 | HR 60 | Ht 68.0 in | Wt 169.2 lb

## 2023-01-11 DIAGNOSIS — E871 Hypo-osmolality and hyponatremia: Secondary | ICD-10-CM

## 2023-01-11 DIAGNOSIS — D649 Anemia, unspecified: Secondary | ICD-10-CM

## 2023-01-11 DIAGNOSIS — Z23 Encounter for immunization: Secondary | ICD-10-CM

## 2023-01-11 DIAGNOSIS — I10 Essential (primary) hypertension: Secondary | ICD-10-CM

## 2023-01-11 DIAGNOSIS — K2100 Gastro-esophageal reflux disease with esophagitis, without bleeding: Secondary | ICD-10-CM

## 2023-01-11 DIAGNOSIS — Z5181 Encounter for therapeutic drug level monitoring: Secondary | ICD-10-CM

## 2023-01-11 DIAGNOSIS — Z125 Encounter for screening for malignant neoplasm of prostate: Secondary | ICD-10-CM

## 2023-01-11 DIAGNOSIS — Z0000 Encounter for general adult medical examination without abnormal findings: Secondary | ICD-10-CM | POA: Diagnosis not present

## 2023-01-11 DIAGNOSIS — E559 Vitamin D deficiency, unspecified: Secondary | ICD-10-CM | POA: Diagnosis not present

## 2023-01-11 DIAGNOSIS — D692 Other nonthrombocytopenic purpura: Secondary | ICD-10-CM

## 2023-01-11 DIAGNOSIS — I700 Atherosclerosis of aorta: Secondary | ICD-10-CM

## 2023-01-11 DIAGNOSIS — K2970 Gastritis, unspecified, without bleeding: Secondary | ICD-10-CM

## 2023-01-11 MED ORDER — OMEPRAZOLE 40 MG PO CPDR
40.0000 mg | DELAYED_RELEASE_CAPSULE | Freq: Two times a day (BID) | ORAL | 0 refills | Status: DC
Start: 2023-01-11 — End: 2023-04-09

## 2023-01-11 MED ORDER — ROSUVASTATIN CALCIUM 5 MG PO TABS
5.0000 mg | ORAL_TABLET | Freq: Every day | ORAL | 3 refills | Status: DC
Start: 2023-01-11 — End: 2024-01-13

## 2023-01-11 NOTE — Telephone Encounter (Signed)
Pt checked out and scheduled, a cpe for October the 13th 2025 and he would like to come in October the 9th for lab work prior if that is ok

## 2023-01-11 NOTE — Telephone Encounter (Signed)
Awaiting additional labs that were added on today. If he needs to return sooner for f/u on anemia, then we can't put in orders for physical yet (or might be released too soon).

## 2023-01-12 NOTE — Telephone Encounter (Signed)
Orders entered

## 2023-01-13 ENCOUNTER — Encounter: Payer: Medicare Other | Admitting: Internal Medicine

## 2023-01-18 ENCOUNTER — Telehealth: Payer: Self-pay | Admitting: *Deleted

## 2023-01-18 NOTE — Telephone Encounter (Signed)
Patient dropped off form for pilot CPE, wants to to know if you can take a look at it and see if you can fill it out. 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 I put it in your folder.

## 2023-01-18 NOTE — Telephone Encounter (Signed)
Needs OV for form completion. (Things required for form that weren't done at recent visit, including peripheral vision, color vision testing).

## 2023-01-27 ENCOUNTER — Ambulatory Visit: Payer: Medicare Other | Admitting: Family Medicine

## 2023-01-27 ENCOUNTER — Encounter: Payer: Self-pay | Admitting: Family Medicine

## 2023-01-27 VITALS — BP 120/60 | HR 60 | Ht 68.0 in | Wt 168.6 lb

## 2023-01-27 DIAGNOSIS — Z0289 Encounter for other administrative examinations: Secondary | ICD-10-CM | POA: Diagnosis not present

## 2023-01-27 NOTE — Progress Notes (Signed)
Chief Complaint  Patient presents with   Consult    Patient is here for form completion for flight. Passed hearing w/hearing aides in only. Color vision slides done (passed). Vision in chart.     Patient brings in forms to be completed. He denies any complaints or concerns today.   PMH, PSH, SH reviewed  Outpatient Encounter Medications as of 01/27/2023  Medication Sig Note   Cholecalciferol (VITAMIN D) 50 MCG (2000 UT) CAPS Take 1 capsule by mouth daily.    Coenzyme Q10 (COQ10) 100 MG CAPS Take 1 capsule by mouth daily.    fish oil-omega-3 fatty acids 1000 MG capsule Take 1 g by mouth daily.    losartan (COZAAR) 100 MG tablet TAKE 1 TABLET(100 MG) BY MOUTH DAILY    omeprazole (PRILOSEC) 40 MG capsule Take 1 capsule (40 mg total) by mouth 2 (two) times daily before a meal.    Pyridoxine HCl (VITAMIN B-6) 500 MG tablet Take 500 mg by mouth daily.    rosuvastatin (CRESTOR) 5 MG tablet Take 1 tablet (5 mg total) by mouth daily.    vitamin B-12 (CYANOCOBALAMIN) 1000 MCG tablet Take 1,000 mcg by mouth daily.    vitamin C (ASCORBIC ACID) 500 MG tablet Take 500 mg by mouth daily.    albuterol (VENTOLIN HFA) 108 (90 Base) MCG/ACT inhaler Inhale 2 puffs into the lungs every 6 (six) hours as needed for wheezing. Use 30 mins prior to exercise (Patient not taking: Reported on 08/06/2022) 01/27/2023: As needed   fluorouracil (EFUDEX) 5 % cream 1 application Externally once a day 4 weeks (Patient not taking: Reported on 08/06/2022) 01/27/2023: As needed   No facility-administered encounter medications on file as of 01/27/2023.   No Known Allergies  ROS: no f/c, headaches, dizziness, syncope, CP, shortness of breath, edema. Denies vision/hearing changes. Uses hearing aid.     PHYSICAL EXAM:  BP 120/60   Pulse 60   Ht 5\' 8"  (1.727 m)   Wt 168 lb 9.6 oz (76.5 kg)   BMI 25.64 kg/m   Hearing screening was done WITH HIS HEARING AIDS. He did not pass/could not hear without them. Hearing  Screening   500Hz  1000Hz  2000Hz  4000Hz   Right ear 25 25 25 25   Left ear 25 25 25 25    Vision Screening   Right eye Left eye Both eyes  Without correction 20 20 20 25 20 20   With correction      Normal peripheral vision (70-90 degrees bilaterally)  Neck: no lymphadenopathy or mass Heart: regular rate and rhythm Lungs: clear bilaterally Extremities: no edema Neuro: alert and oriented, cranial nerves grossly intact.  Gait unchanged, abnl related to LL discrepancy. Psych: normal mood, affect, hygiene and grooming.     ASSESSMENT/PLAN:  Encounter for completion of form with patient - forms completed--cleared for flying, to wear hearing aids.  BP's well controlled, no issues there.See scanned forms  Forms completed. Advised he must use hearing aides when flying

## 2023-02-02 LAB — FERRITIN: Ferritin: 91 ng/mL (ref 30–400)

## 2023-02-02 LAB — SPECIMEN STATUS REPORT

## 2023-02-02 LAB — IRON: Iron: 46 ug/dL (ref 38–169)

## 2023-02-02 LAB — VITAMIN B12: Vitamin B-12: 787 pg/mL (ref 232–1245)

## 2023-02-16 ENCOUNTER — Encounter: Payer: Self-pay | Admitting: Family Medicine

## 2023-02-16 ENCOUNTER — Other Ambulatory Visit: Payer: Self-pay | Admitting: *Deleted

## 2023-02-16 MED ORDER — LOSARTAN POTASSIUM 100 MG PO TABS: ORAL_TABLET | ORAL | 3 refills | Status: DC

## 2023-03-19 ENCOUNTER — Ambulatory Visit: Payer: Medicare Other | Admitting: Medical

## 2023-03-19 VITALS — BP 124/72 | HR 63 | Temp 97.1°F | Wt 173.2 lb

## 2023-03-19 DIAGNOSIS — H6693 Otitis media, unspecified, bilateral: Secondary | ICD-10-CM | POA: Diagnosis not present

## 2023-03-19 DIAGNOSIS — H6120 Impacted cerumen, unspecified ear: Secondary | ICD-10-CM | POA: Diagnosis not present

## 2023-03-19 MED ORDER — CIPROFLOXACIN-HYDROCORTISONE 0.2-1 % OT SUSP: 3.0000 [drp] | Freq: Two times a day (BID) | OTIC | 0 refills | Status: DC

## 2023-03-19 MED ORDER — AZITHROMYCIN 250 MG PO TABS: ORAL_TABLET | ORAL | 0 refills | Status: DC

## 2023-03-19 NOTE — Progress Notes (Signed)
Subjective:  Wayne Brandt is a 72 y.o. male who presents for Chief Complaint  Patient presents with   Ear Pain    Both ears fill stuffy, fever, feeling crummy. Left ear pain and then started popping, there is a ringing in the ear     Here for ear issues.   Had cold 3 weeks ago, common cold, no fever, just felt like a cold.  Ears have been stopped up.  Has had ringing in ears, hearing decreased.    Does nasal saline rinse every morning.  Has had some yellow green nasal discharge.   Yesterday though felt worse, ears stopped up worse, left ear pain, congested.  Has had popping sounds of left ear.  This morning fever resolved.  Felt bad overall yesterday  No sore throat.  Sometimes cough.    Has appt next week to see ENT.    No other aggravating or relieving factors.    No other c/o.  Past Medical History:  Diagnosis Date   Actinic keratosis    Dr. Sharyn Lull   Arthritis    wrists, L thumb   BPH (benign prostatic hyperplasia)    ED (erectile dysfunction)    Family history of premature CAD    GERD (gastroesophageal reflux disease)    HH (hiatus hernia)    History of basal cell carcinoma excision    2013-- chest area   History of esophagogastroduodenoscopy (EGD) 12/06/2017   Dr.Medoff-distal esophageal stricture,hiatal hernia,gastritis and duodenitis.   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     Coenzyme Q10 (COQ10) 100 MG CAPS Take 1 capsule by mouth daily.     fish oil-omega-3 fatty acids 1000 MG capsule Take 1 g by mouth daily.     losartan (COZAAR) 100 MG tablet TAKE 1  TABLET(100 MG) BY MOUTH DAILY 90 tablet 3   omeprazole (PRILOSEC) 40 MG capsule Take 1 capsule (40 mg total) by mouth 2 (two) times daily before a meal. 60 capsule 0   Pyridoxine HCl (VITAMIN B-6) 500 MG tablet Take 500 mg by mouth daily.     rosuvastatin (CRESTOR) 5 MG tablet Take 1 tablet (5 mg total) by mouth daily. 90 tablet 3   vitamin B-12 (CYANOCOBALAMIN) 1000 MCG tablet Take 1,000 mcg by mouth daily.     vitamin C (ASCORBIC ACID) 500 MG tablet Take 500 mg by mouth daily.     albuterol (VENTOLIN HFA) 108 (90 Base) MCG/ACT inhaler Inhale 2 puffs into the lungs every 6 (six) hours as needed for wheezing. Use 30 mins prior to exercise (Patient not taking: Reported on 03/19/2023) 18 g 0   fluorouracil (EFUDEX) 5 % cream 1 application Externally once a day 4 weeks (Patient not taking: Reported on 08/06/2022)     No current facility-administered medications on file prior to visit.    The following portions of the patient's history were reviewed and updated as appropriate: allergies, current medications, past family history, past medical history, past social history, past surgical history and problem  list.  ROS Otherwise as in subjective above    Objective: BP 124/72   Pulse 63   Temp (!) 97.1 F (36.2 C)   Wt 173 lb 3.2 oz (78.6 kg)   SpO2 98%   BMI 26.33 kg/m   Wt Readings from Last 3 Encounters:  03/19/23 173 lb 3.2 oz (78.6 kg)  01/27/23 168 lb 9.6 oz (76.5 kg)  01/11/23 169 lb 3.2 oz (76.7 kg)    General appearance: alert, no distress, well developed, well nourished HEENT: normocephalic, sclerae anicteric, conjunctiva pink and moist, nares patent, no discharge or erythema, pharynx normal Oral cavity: MMM, no lesions Bilateral ear canals with impacted cerumen.  After cerumen removed right TM with yellowish clear fluid behind the eardrum, left TM bulging and erythema, air-fluid levels present Neck: supple, no lymphadenopathy, no thyromegaly, no masses Lungs: CTA bilaterally,  no wheezes, rhonchi, or rales    Assessment: Encounter Diagnosis  Name Primary?   Acute bacterial infection of both middle ears Yes     Plan: Discussed findings.  Discussed risk/benefits of procedure and patient agrees to procedure. Successfully used warm water lavage to remove impacted cerumen from bilat ear canal. Patient tolerated procedure well. Advised they avoid using any cotton swabs or other devices to clean the ear canals.  Use basic hygiene as discussed.  Follow up prn.   We discussed the findings.  He does not want to use amoxicillin based antibiotics.  Begin treatment below.  Increase water intake.  Consider Mucinex or Benadryl the counter the next few days to help get rid of mucus.  Follow-up with ENT next week as scheduled as he already has an appointment for eval on this as well.   Wayne Brandt was seen today for ear pain.  Diagnoses and all orders for this visit:  Acute bacterial infection of both middle ears  Other orders -     azithromycin (ZITHROMAX) 250 MG tablet; 2 tablets day 1, then 1 tablet days 2-4 -     ciprofloxacin-hydrocortisone (CIPRO HC OTIC) OTIC suspension; Place 3 drops into both ears 2 (two) times daily.    Follow up: with ENT next week as planned

## 2023-04-09 ENCOUNTER — Other Ambulatory Visit: Payer: Self-pay | Admitting: Family Medicine

## 2023-04-09 DIAGNOSIS — K2970 Gastritis, unspecified, without bleeding: Secondary | ICD-10-CM

## 2023-04-09 MED ORDER — OMEPRAZOLE 40 MG PO CPDR
40.0000 mg | DELAYED_RELEASE_CAPSULE | Freq: Two times a day (BID) | ORAL | 0 refills | Status: DC
Start: 2023-04-09 — End: 2023-04-26

## 2023-04-26 ENCOUNTER — Ambulatory Visit: Payer: Medicare Other | Admitting: Internal Medicine

## 2023-04-26 ENCOUNTER — Encounter: Payer: Self-pay | Admitting: Internal Medicine

## 2023-04-26 VITALS — BP 130/60 | HR 67 | Ht 68.0 in | Wt 169.0 lb

## 2023-04-26 DIAGNOSIS — Z1211 Encounter for screening for malignant neoplasm of colon: Secondary | ICD-10-CM

## 2023-04-26 DIAGNOSIS — K219 Gastro-esophageal reflux disease without esophagitis: Secondary | ICD-10-CM | POA: Diagnosis not present

## 2023-04-26 DIAGNOSIS — K200 Eosinophilic esophagitis: Secondary | ICD-10-CM | POA: Diagnosis not present

## 2023-04-26 DIAGNOSIS — D649 Anemia, unspecified: Secondary | ICD-10-CM | POA: Diagnosis not present

## 2023-04-26 NOTE — Patient Instructions (Signed)
 Decrease your omeprazole  to one daily.  We will place a colonoscopy recall for November 2025.   _______________________________________________________  If your blood pressure at your visit was 140/90 or greater, please contact your primary care physician to follow up on this.  _______________________________________________________  If you are age 74 or older, your body mass index should be between 23-30. Your Body mass index is 25.7 kg/m. If this is out of the aforementioned range listed, please consider follow up with your Primary Care Provider.  If you are age 49 or younger, your body mass index should be between 19-25. Your Body mass index is 25.7 kg/m. If this is out of the aformentioned range listed, please consider follow up with your Primary Care Provider.   ________________________________________________________  The [facility] providers would like to encourage you to use MYCHART to communicate with providers for non-urgent requests or questions.  Due to long hold times on the telephone, sending your provider a message by [facility] may be a faster and more efficient way to get a response.  Please allow 48 business hours for a response.  Please remember that this is for non-urgent requests.  _______________________________________________________   I appreciate the opportunity to care for you. Lupita Commander, MD, [facility]

## 2023-04-26 NOTE — Progress Notes (Signed)
 Wayne Brandt 72 y.o. 1950/05/21 [id number]  Assessment & Plan:   Encounter Diagnoses  Name Primary?   Eosinophilic esophagitis Yes   Gastroesophageal reflux disease, unspecified whether esophagitis present    Normocytic anemia    Colon cancer screening    The patient is without dysphagia at this time.  He knows he is situation well and will contact me if he starts to have dysphagia again.  Etiology has been reported to be eosinophilic esophagitis though I do not have records that show that I will trust that Dr. Luis had found that previously.  I think there is likely a component of GERD as well.  He will continue PPI.  I think daily high-dose omeprazole  at 40 mg is reasonable.  He should try to avoid dairy as that is the #1 culprit as a trigger for EOE.  His anemia is  chronic, and iron B12 and folate studies are all within normal range.  Question mild anemia of chronic disease.  I would not do any further workup at this point.  He will be due for a routine screening colonoscopy in approximately November of this year and we will plan on that.  Should he need EGD with dilation at that time we could do that at the same time.  He is aware.  CC: Randol Dawes, MD   Subjective:   Chief Complaint: Anemia, history of dysphagia/EOE  HPI 73 year old white man with long history of eosinophilic esophagitis and recurrent dysphagia status post numerous upper endoscopy and dilation procedures by Dr. Luis, That in July of last year when he had a food impaction.  I performed endoscopy and remove the food impaction.  I did see distal and proximal  esophageal stenoses but did not dilate.  Because of the history I took biopsies and proximal and distal esophageal biopsies were more consistent with GERD according to the pathologist and no increased eosinophils.  He is sent back by Dr. Randol, because of mild anemia and question of need for additional GI workup plus to review his GI situation.  The  patient is without any dysphagia since I removed the food impaction.  He was scheduled for a follow-up in the office but canceled that due to lack of symptoms.  He is on omeprazole  40 mg once a day mostly though sometimes twice daily as that was recommended by Dr. Randol after she saw a low hemoglobin.  No melena or rectal bleeding.   His last EGD prior to the 2024 EGD was in August 2020 Dr. Luis dilated serially up to 15 mm with a Savary dilator there was a 3 cm hiatal hernia gastritis and duodenitis.  Esophageal biopsies not taken.  The patient reports at some point along the way Dr. Luis advised him to avoid drinking milk so he uses almond milk.  He does eat yogurt though.  Review of the chart from what I can see suggest that at some point he was treated with inhaled budesonide.  I see anemia dating back to 2008 with hemoglobin of 12.3 then.  Then it was in the 10 range in 2008.  Long gap, and hemoglobin 12 range 4 years ago.     Latest Ref Rng & Units 01/07/2023    8:27 AM 12/11/2021    8:47 AM 11/27/2021    3:38 PM  CBC  WBC 3.4 - 10.8 x10E3/uL 4.6  5.0  7.6   Hemoglobin 13.0 - 17.7 g/dL 88.1  87.4  88.9   Hematocrit  37.5 - 51.0 % 35.0  36.5  31.4   Platelets 150 - 450 x10E3/uL 197  176  228    Lab Results  Component Value Date   IRON 46 01/07/2023   TIBC 162 (L) 11/27/2021   FERRITIN 91 01/07/2023   Lab Results  Component Value Date   VITAMINB12 787 01/07/2023     No Known Allergies Current Meds  Medication Sig   azithromycin  (ZITHROMAX ) 250 MG tablet 2 tablets day 1, then 1 tablet days 2-4   Cholecalciferol (VITAMIN D ) 50 MCG (2000 UT) CAPS Take 1 capsule by mouth daily.   ciprofloxacin -hydrocortisone  (CIPRO  HC OTIC) OTIC suspension Place 3 drops into both ears 2 (two) times daily.   Coenzyme Q10 (COQ10) 100 MG CAPS Take 1 capsule by mouth daily.   fish oil-omega-3 fatty acids 1000 MG capsule Take 1 g by mouth daily.   fluorouracil (EFUDEX) 5 % cream    losartan  (COZAAR )  100 MG tablet TAKE 1 TABLET(100 MG) BY MOUTH DAILY   omeprazole  (PRILOSEC) 40 MG capsule Take 1 capsule (40 mg total) by mouth 2 (two) times daily before a meal.   Pyridoxine HCl (VITAMIN B-6) 500 MG tablet Take 500 mg by mouth daily.   rosuvastatin  (CRESTOR ) 5 MG tablet Take 1 tablet (5 mg total) by mouth daily.   vitamin B-12 (CYANOCOBALAMIN) 1000 MCG tablet Take 1,000 mcg by mouth daily.   vitamin C (ASCORBIC ACID) 500 MG tablet Take 500 mg by mouth daily.   Past Medical History:  Diagnosis Date   Actinic keratosis    Dr. Tricia   Arthritis    wrists, L thumb   BPH (benign prostatic hyperplasia)    ED (erectile dysfunction)    Family history of premature CAD    GERD (gastroesophageal reflux disease)    HH (hiatus hernia)    History of basal cell carcinoma excision    2013-- chest area   History of esophagogastroduodenoscopy (EGD) 12/06/2017   Dr.Medoff-distal esophageal stricture,hiatal hernia,gastritis and duodenitis.   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   S/P dilatation of esophageal stricture    multiple times   SCC (squamous cell carcinoma) 2020   face   Urinary retention    Past Surgical History:  Procedure Laterality Date   ANKLE FUSION Left 1997   BAND HEMORRHOIDECTOMY     Dr. Luis   BIOPSY  10/26/2022   Procedure: BIOPSY;  Surgeon: Avram Lupita BRAVO, MD;  Location: [facility];  Service: Gastroenterology;;   CARDIOVASCULAR STRESS TEST  2006   normal--Dr. Lavona   CATARACT EXTRACTION Right 04/08/2020   CATARACT EXTRACTION Left 05/20/2020   COLONOSCOPY, ESOPHAGOGASTRODUODENOSCOPY (EGD) AND ESOPHAGEAL DILATION  02/15/2014   Dr.Medoff   ESOPHAGEAL DILATION  02/08/2017   Dr. Luis.  EGD showed stricture and HH   ESOPHAGOGASTRODUODENOSCOPY  02/04/2017   ESOPHAGOGASTRODUODENOSCOPY N/A 10/26/2022   Procedure: ESOPHAGOGASTRODUODENOSCOPY (EGD);  Surgeon:  Avram Lupita BRAVO, MD;  Location: [facility];  Service: Gastroenterology;  Laterality: N/A;   ESOPHAGOGASTRODUODENOSCOPY ENDOSCOPY  02/08/2017   FOREIGN BODY REMOVAL  10/26/2022   Procedure: FOREIGN BODY REMOVAL;  Surgeon: Avram Lupita BRAVO, MD;  Location: [facility];  Service: Gastroenterology;;   I & D RIGHT KNEE W/ REMOVAL PATELLA WIRES  02/09/2007   KNEE ARTHROSCOPY Right 07/07/2000   post-op  07-09-2000  right knee arthroscopy w/ lavage and  debridement hemathrosis/ pyathrosis   LEFT FOOT SURGERY  x2  as child   polio   NEUROPLASTY / TRANSPOSITION ULNAR NERVE AT ELBOW Right 01/04/2008   ORIF RIGHT PERIPROSTHEITC PATELLA FX  11/01/2006   REVERSE SHOULDER ARTHROPLASTY Right 03/23/2019   Procedure: REVERSE SHOULDER ARTHROPLASTY;  Surgeon: Melita Drivers, MD;  Location: [facility];  Service: Orthopedics;  Laterality: Right;    TOTAL KNEE ARTHROPLASTY Right 02/22/2006   TRANSURETHRAL RESECTION OF PROSTATE N/A 06/08/2016   Procedure: TRANSURETHRAL RESECTION OF THE PROSTATE (TURP);  Surgeon: Mark Ottelin, MD;  Location: [facility];  Service: Urology;  Laterality: N/A;   UPPER GI ENDOSCOPY  11/21/2018   hiatal hernia, gastritis, duodenitis   WRIST ARTHROSCOPY WITH DEBRIDEMENT Right 03/30/2001   Social History   Social History Narrative   Lives with wife. Son from a previous marriage lives in [place].  1 granddaughter.    Retired 04/15/2018      Updated 12/2022   family history includes Alcohol abuse in his mother; Cancer in his brother and mother; Glaucoma in his brother; Heart disease (age of onset: 40) in his brother; Heart disease (age of onset: 61) in his father; Heart disease (age of onset: 71) in his brother; Hypertension in his son; Pulmonary fibrosis in his brother; Testicular cancer (age of onset: 84) in his brother.   Review of Systems As per HPI.  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  Cannot move as fast as he wants on the tennis  court.  Objective:   Physical Exam BP 130/60   Pulse 67   Ht 5' 8 (1.727 m)   Wt 169 lb (76.7 kg)   BMI 25.70 kg/m  No acute distress elderly white man

## 2023-05-04 ENCOUNTER — Encounter: Payer: Self-pay | Admitting: Family Medicine

## 2023-05-25 NOTE — Progress Notes (Unsigned)
No chief complaint on file.   Patient presents for medical clearance for ankle surgery, as well as complaint of neck pain.  He is planning to have hardware removed from L fibula and tibia next month. 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 Also found that 2 were loose.     H/o neck pain, last evaluated by me for this in 07/2022. PT (from [facility]) has helped in the past. He reported at his physical that he does some regular neck stretches and uses a TENS unit prn with good results.    Hypertension:  He is compliant with Losartan 100mg  daily, and denies side effects.  BPs are running   He denies headaches, dizziness, chest pain, shortness of breath, swelling (just chronic in L ankle)   BP Readings from Last 3 Encounters:  04/26/23 130/60  03/19/23 124/72  01/27/23 120/60    H/o RAD/asthma. No recent issues.  He currently denies any wheezing or shortness of breath.    Aortic atherosclerosis and coronary calcifications. He was started on Crestor 5mg  daily after coronary scan in 2021. He reports compliance and denies side effects.  Lab Results  Component Value Date   CHOL 139 01/07/2023   HDL 78 01/07/2023   LDLCALC 51 01/07/2023   TRIG 44 01/07/2023   CHOLHDL 1.8 01/07/2023      Most recent surgeries/procedures included EGD, cataracts (2021-22), and reverse shoulder arthroplasty in 03/2019  He underwent coronary calcium scan in 12/2019: IMPRESSION: 1. Coronary artery calcium score of 73. This places the patient in the 38th percentile for subjects of the same age, gender and race/ethnicity. 2. Mild dilation of the ascending thoracic aorta, which measures up to 3.5 cm. Recommend annual imaging followup by CTA or MRA. This recommendation follows 2010 ACCF/AHA/AATS/ACR/ASA/SCA/SCAI/SIR/STS/SVM Guidelines for the Diagnosis and Management of Patients with Thoracic Aortic Disease. Circulation.2010; 121:  Z610-R604. Aortic aneurysm NOS (ICD10-I71.9) 3. Small perifissural nodules in the RIGHT chest largest measuring 6 x 4 mm. No routine follow-up imaging is recommended for these intrapulmonary lymph nodes.  He had f/u CT angio 12/2020: IMPRESSION: 1. No evidence of thoracic aortic aneurysm or dissection. 2. Coronary calcifications.  Aortic Atherosclerosis (ICD10-I70.0).  Echo 11/2021: IMPRESSIONS   1. Left ventricular ejection fraction, by estimation, is 60 to 65%. The  left ventricle has normal function. The left ventricle has no regional  wall motion abnormalities. Left ventricular diastolic parameters are  indeterminate.   2. Right ventricular systolic function is normal. The right ventricular  size is normal.   3. Right atrial size was mildly dilated.   4. The mitral valve is normal in structure. Trivial mitral valve  regurgitation. No evidence of mitral stenosis.   5. The aortic valve is calcified. There is moderate calcification of the  aortic valve. There is moderate thickening of the aortic valve. Aortic  valve regurgitation is trivial. Aortic valve sclerosis/calcification is  present, without any evidence of  aortic stenosis.   6. Aortic dilatation noted. There is mild dilatation of the ascending  aorta, measuring 38 mm.   PMH, PSH, SH reviewed   ROS:    PHYSICAL EXAM:  There were no vitals taken for this visit.  Wt Readings from Last 3 Encounters:  04/26/23 169 lb (76.7 kg)  03/19/23 173 lb 3.2 oz (78.6 kg)  01/27/23 168 lb 9.6 oz (76.5 kg)   Well-appearing male in no distress HEENT: conjunctiva and sclera are clear, EOMI Neck:  No lymphadenopathy, thyromegaly or mass.  No spinal tenderness.  ***07/2022:Trigger points are present at the trapezius (in shoulder) and L paraspinous. ROM--Limited turning to left, improved on the right (but not quite FROM); he has pain with L ear to L shoulder. Full forward flexion  Heart: regular rate and rhythm Lungs: clear  bilaterally, no wheezes, rales, ronchi Abdomen: soft, nontender, no organomegaly or mass Back: no spinal or CVA tenderness Skin: no rashes, normal turgor Neuro: normal strength, sensation, DTR's. Psych: normal mood, affect, hygiene and grooming    ASSESSMENT/PLAN:   Last EKG 11/2021.  If seeing anesthesia pre-op, probably doesn't need done at visit CBC needs to be done prior to surgery  Need to look at the clearance forms to see what they will be doing vs [place].

## 2023-05-26 ENCOUNTER — Encounter: Payer: Self-pay | Admitting: Family Medicine

## 2023-05-26 ENCOUNTER — Institutional Professional Consult (permissible substitution): Payer: Medicare Other | Admitting: Family Medicine

## 2023-05-26 ENCOUNTER — Ambulatory Visit: Payer: Medicare Other | Admitting: Family Medicine

## 2023-05-26 VITALS — BP 128/70 | HR 72 | Ht 68.0 in | Wt 169.2 lb

## 2023-05-26 DIAGNOSIS — Z01818 Encounter for other preprocedural examination: Secondary | ICD-10-CM

## 2023-05-26 DIAGNOSIS — I10 Essential (primary) hypertension: Secondary | ICD-10-CM

## 2023-05-26 DIAGNOSIS — R0609 Other forms of dyspnea: Secondary | ICD-10-CM

## 2023-05-26 DIAGNOSIS — M542 Cervicalgia: Secondary | ICD-10-CM

## 2023-05-26 DIAGNOSIS — Z862 Personal history of diseases of the blood and blood-forming organs and certain disorders involving the immune mechanism: Secondary | ICD-10-CM | POA: Diagnosis not present

## 2023-05-26 DIAGNOSIS — J4520 Mild intermittent asthma, uncomplicated: Secondary | ICD-10-CM

## 2023-05-26 NOTE — Patient Instructions (Signed)
We are referring you to cardiology to further evaluate the shortness of breath with activity that you are reporting, which seems to be a little worse (2 flights of stairs).  It is possible that your asthma can contribute. Try using the albuterol inhaler about 20 minutes before exercise, to see if this helps your breathing. If it does, then continue to use the albuterol prior to exercise (daily). Let us know if this helps.  If it doesn't, then the cardiology referral is the appropriate step.

## 2023-05-27 ENCOUNTER — Encounter: Payer: Self-pay | Admitting: Family Medicine

## 2023-05-27 LAB — CBC WITH DIFFERENTIAL/PLATELET
Basophils Absolute: 0 10*3/uL (ref 0.0–0.2)
Basos: 0 %
EOS (ABSOLUTE): 0.1 10*3/uL (ref 0.0–0.4)
Eos: 1 %
Hematocrit: 33.4 % — ABNORMAL LOW (ref 37.5–51.0)
Hemoglobin: 11.4 g/dL — ABNORMAL LOW (ref 13.0–17.7)
Immature Grans (Abs): 0 10*3/uL (ref 0.0–0.1)
Immature Granulocytes: 0 %
Lymphocytes Absolute: 1.1 10*3/uL (ref 0.7–3.1)
Lymphs: 14 %
MCH: 31.6 pg (ref 26.6–33.0)
MCHC: 34.1 g/dL (ref 31.5–35.7)
MCV: 93 fL (ref 79–97)
Monocytes Absolute: 0.7 10*3/uL (ref 0.1–0.9)
Monocytes: 8 %
Neutrophils Absolute: 6 10*3/uL (ref 1.4–7.0)
Neutrophils: 77 %
Platelets: 214 10*3/uL (ref 150–450)
RBC: 3.61 x10E6/uL — ABNORMAL LOW (ref 4.14–5.80)
RDW: 13.1 % (ref 11.6–15.4)
WBC: 7.8 10*3/uL (ref 3.4–10.8)

## 2023-05-27 NOTE — Telephone Encounter (Signed)
Patient advised.

## 2023-05-31 ENCOUNTER — Telehealth: Payer: Self-pay | Admitting: Family Medicine

## 2023-05-31 NOTE — Telephone Encounter (Signed)
 Pt dropped off an optum financial form to be filled out. Ws placed in Dr.Knapps folder.

## 2023-06-07 ENCOUNTER — Other Ambulatory Visit: Payer: Self-pay | Admitting: *Deleted

## 2023-06-07 ENCOUNTER — Encounter: Payer: Self-pay | Admitting: Family Medicine

## 2023-06-07 DIAGNOSIS — R0609 Other forms of dyspnea: Secondary | ICD-10-CM

## 2023-06-07 NOTE — Telephone Encounter (Signed)
 Checked with patient and yes, the inhaler is not working. Referral done.

## 2023-06-09 ENCOUNTER — Other Ambulatory Visit: Payer: Self-pay | Admitting: *Deleted

## 2023-06-09 ENCOUNTER — Encounter: Payer: Self-pay | Admitting: Family Medicine

## 2023-06-09 MED ORDER — TIZANIDINE HCL 4 MG PO TABS: 4.0000 mg | ORAL_TABLET | Freq: Three times a day (TID) | ORAL | 0 refills | Status: DC | PRN

## 2023-06-11 ENCOUNTER — Ambulatory Visit: Payer: Medicare Other | Attending: Cardiology | Admitting: Cardiology

## 2023-06-11 VITALS — BP 148/80 | HR 61 | Ht 68.0 in | Wt 170.6 lb

## 2023-06-11 DIAGNOSIS — I700 Atherosclerosis of aorta: Secondary | ICD-10-CM

## 2023-06-11 DIAGNOSIS — R011 Cardiac murmur, unspecified: Secondary | ICD-10-CM | POA: Diagnosis not present

## 2023-06-11 DIAGNOSIS — I10 Essential (primary) hypertension: Secondary | ICD-10-CM

## 2023-06-11 DIAGNOSIS — I77810 Thoracic aortic ectasia: Secondary | ICD-10-CM

## 2023-06-11 NOTE — Progress Notes (Signed)
 Cardiology Office Note:   Date:  06/13/2023  ID:  Wayne Brandt, DOB 04-29-1950, MRN [id number] PCP: Joselyn Arrow, MD  [facility] Providers Cardiologist:  None    History of Present Illness:   Discussed the use of AI scribe software for clinical note transcription with the patient, who gave verbal consent to proceed.  Wayne Brandt is a 73 year old male with aortic atherosclerosis, hypertension, and dilated descending thoracic aorta who presents with increased shortness of breath with exertion. He was referred by his primary care provider for cardiology evaluation of shortness of breath with exertion.  Upon discussion of timeline of patient's symptoms, he states that he has experienced shortness of breath with exertion over the past four to five years (he associates with the period following the COVID-19 pandemic). Activities such as walking to the mailbox, playing tennis, and using exercise equipment at the gym now cause him to become breathless more quickly in comparison to pre-pandemic. His primary observation in terms of symptoms is that onset of faster breathing occurs earlier during physical activities. There have been no recent changes in his symptoms, and the shortness of breath has been a gradual decline over the years.  No chest pain, chest tightness, or chest pressure. No dizziness, lightheadedness, or irregular heartbeats, although he notes feeling slightly woozy when getting up after lying down for long periods. No swelling in his legs or abdomen and no trouble laying flat at night.  He has a history of exercise-induced asthma and uses an albuterol inhaler, although it does not alleviate his symptoms during exercise. He has not experienced asthma symptoms during exercise recently, except for occasional episodes triggered by cold weather and environmental factors.  He monitors his blood pressure at home, which is typically around 121/50 mmHg. He is on losartan 100 mg for  hypertension and Crestor 5 mg for cholesterol management, with his LDL cholesterol well-controlled at 51 mg/dL.  There is a significant family history of coronary artery disease, with his father and two brothers having experienced heart attacks at relatively young ages. His brother, who was two years younger, passed away from pulmonary fibrosis, and another brother required stents after a heart attack.      Today patient denies chest pain, shortness of breath, lower extremity edema, fatigue, palpitations, melena, hematuria, hemoptysis, diaphoresis, weakness, presyncope, syncope, orthopnea, and PND.   Studies Reviewed:    EKG:        01/04/20 Cardiac Calcium Scoring CT  EXAM: CT CARDIAC CORONARY ARTERY CALCIUM SCORE   TECHNIQUE: Non-contrast imaging through the heart was performed using prospective ECG gating. Image post processing was performed on an independent workstation, allowing for quantitative analysis of the heart and coronary arteries. Note that this exam targets the heart and the chest was not imaged in its entirety.   COMPARISON:  None   FINDINGS: CORONARY CALCIUM SCORES:   Left Main: 1.5   LAD: 47.5   LCx: 24   RCA: 0   Total Agatston Score: 73   MESA database percentile: 38   AORTA MEASUREMENTS:   Ascending Aorta: 36 mm   Descending Aorta: 29 mm   OTHER FINDINGS:   Cardiovascular: Calcified coronary artery disease as outlined above.   Mild dilation of the ascending thoracic aorta. No pericardial effusion. Aortic valvular calcifications.   Mediastinum/Nodes: No adenopathy in the imaged portions of the mediastinum.   Lungs/Pleura: Small perifissural nodules in the RIGHT chest largest measuring 6 x 4 mm (image 22, series 9) another  on image 25 of series 9 measuring approximately 4 x 4 mm.   No consolidative change or sign of pleural effusion. Visualized airways are patent.   Upper Abdomen: Incidental imaging of upper abdominal contents  is unremarkable.   Musculoskeletal: No acute bone finding or destructive bone process.   IMPRESSION: 1. Coronary artery calcium score of 73. This places the patient in the 38th percentile for subjects of the same age, gender and race/ethnicity. 2. Mild dilation of the ascending thoracic aorta, which measures up to 3.5 cm. Recommend annual imaging followup by CTA or MRA. This recommendation follows 2010 ACCF/AHA/AATS/ACR/ASA/SCA/SCAI/SIR/STS/SVM Guidelines for the Diagnosis and Management of Patients with Thoracic Aortic Disease. Circulation.2010; 121: W098-J191. Aortic aneurysm NOS (ICD10-I71.9) 3. Small perifissural nodules in the RIGHT chest largest measuring 6 x 4 mm. No routine follow-up imaging is recommended for these intrapulmonary lymph nodes.  11/21/2021 TTE  IMPRESSIONS     1. Left ventricular ejection fraction, by estimation, is 60 to 65%. The  left ventricle has normal function. The left ventricle has no regional  wall motion abnormalities. Left ventricular diastolic parameters are  indeterminate.   2. Right ventricular systolic function is normal. The right ventricular  size is normal.   3. Right atrial size was mildly dilated.   4. The mitral valve is normal in structure. Trivial mitral valve  regurgitation. No evidence of mitral stenosis.   5. The aortic valve is calcified. There is moderate calcification of the  aortic valve. There is moderate thickening of the aortic valve. Aortic  valve regurgitation is trivial. Aortic valve sclerosis/calcification is  present, without any evidence of  aortic stenosis.   6. Aortic dilatation noted. There is mild dilatation of the ascending  aorta, measuring 38 mm.   FINDINGS   Left Ventricle: Left ventricular ejection fraction, by estimation, is 60  to 65%. The left ventricle has normal function. The left ventricle has no  regional wall motion abnormalities. The left ventricular internal cavity  size was normal in size.  There is   no left ventricular hypertrophy. Left ventricular diastolic parameters  are indeterminate. Normal left ventricular filling pressure.   Right Ventricle: The right ventricular size is normal. No increase in  right ventricular wall thickness. Right ventricular systolic function is  normal.   Left Atrium: Left atrial size was normal in size.   Right Atrium: Right atrial size was mildly dilated.   Pericardium: There is no evidence of pericardial effusion.   Mitral Valve: The mitral valve is normal in structure. Trivial mitral  valve regurgitation. No evidence of mitral valve stenosis.   Tricuspid Valve: The tricuspid valve is normal in structure. Tricuspid  valve regurgitation is mild . No evidence of tricuspid stenosis.   Aortic Valve: The aortic valve is calcified. There is moderate  calcification of the aortic valve. There is moderate thickening of the  aortic valve. Aortic valve regurgitation is trivial. Aortic valve  sclerosis/calcification is present, without any  evidence of aortic stenosis.   Pulmonic Valve: The pulmonic valve was normal in structure. Pulmonic valve  regurgitation is trivial. No evidence of pulmonic stenosis.   Aorta: Aortic dilatation noted. There is mild dilatation of the ascending  aorta, measuring 38 mm.   Venous: The inferior vena cava was not well visualized.   IAS/Shunts: No atrial level shunt detected by color flow Doppler.    Risk Assessment/Calculations:    Physical Exam:   VS:  BP (!) 148/80   Pulse 61   Ht 5\' 8"  (  1.727 m)   Wt 170 lb 9.6 oz (77.4 kg)   SpO2 97%   BMI 25.94 kg/m    Wt Readings from Last 3 Encounters:  06/11/23 170 lb 9.6 oz (77.4 kg)  05/26/23 169 lb 3.2 oz (76.7 kg)  04/26/23 169 lb (76.7 kg)     Physical Exam Vitals reviewed.  Constitutional:      Appearance: Normal appearance.  HENT:     Head: Normocephalic.  Eyes:     Pupils: Pupils are equal, round, and reactive to light.  Cardiovascular:      Rate and Rhythm: Normal rate and regular rhythm.     Pulses: Normal pulses.     Heart sounds: Murmur (faint systolic murmur) heard.  Pulmonary:     Effort: Pulmonary effort is normal.     Breath sounds: Normal breath sounds.  Abdominal:     General: Abdomen is flat.     Palpations: Abdomen is soft.  Musculoskeletal:     Right lower leg: No edema.     Left lower leg: No edema.  Skin:    General: Skin is warm and dry.     Capillary Refill: Capillary refill takes less than 2 seconds.  Neurological:     General: No focal deficit present.     Mental Status: He is alert and oriented to person, place, and time.  Psychiatric:        Mood and Affect: Mood normal.        Behavior: Behavior normal.        Thought Content: Thought content normal.        Judgment: Judgment normal.    ASSESSMENT AND PLAN:       Exertional Dyspnea 73 year old male with aortic atherosclerosis, hypertension, dilated descending thoracic aorta, allergic rhinitis, exercise-induced asthma, and gastroesophageal reflux presenting with increased exertional dyspnea over the past 3-4 years. No acute worsening of symptoms, rather feels that they have largely been stable over the last few years. No chest pain, dizziness, or significant orthopnea. Family history of significant coronary artery disease. Coronary calcium CT scoring in 2021 showed a score of 73 (38th percentile for age and demographic). Differential includes deconditioning, valvular heart disease, and potential pulmonary issues.  Ultrasound will evaluate for valvular abnormalities and the dilated ascending aorta. Discussed stress test but given lack of acute change in symptoms, will defer at this time. Reconsider if echo shows significant abnormalities or patient's symptoms progress. - Order cardiac ultrasound to evaluate for cardiomyopathy, valvular abnormalities, and assess the dilated ascending aorta - Monitor symptoms and consider further evaluation if symptoms  worsen - Reevaluate in 6 months with Dr. Antoine Poche.  Dilated Ascending Aorta 3.8 cm on 11/21/21 TTE. - Repeat TTE to assess.  Hypertension Home blood pressure readings are well-controlled (121/50s), but in-office readings were elevated (148/80). Discussed alcohol's impact on blood pressure and heart health. Patient agreed to monitor and potentially reduce alcohol intake. - Continue monitoring blood pressure at home - Maintain current medication (losartan 100 mg)  Aortic atherosclerosis 2022 CTA chest noted coronary artery calcifications and minimal amount of atherosclerotic plaque. Cholesterol levels are excellent (LDL 51). Patient is on Crestor 5 mg. Discussed alcohol's impact on blood pressure and heart health. - Continue Crestor 5 mg - Monitor and potentially reduce alcohol intake to manage blood pressure  Follow-up - Schedule cardiac ultrasound in the next couple of weeks - Follow up with Dr. Antoine Poche in 6 months.  Signed, Perlie Gold, PA-C

## 2023-06-11 NOTE — Patient Instructions (Signed)
 Testing/Procedures: Echo  Your physician has requested that you have an echocardiogram. Echocardiography is a painless test that uses sound waves to create images of your heart. It provides your doctor with information about the size and shape of your heart and how well your heart's chambers and valves are working. This procedure takes approximately one hour. There are no restrictions for this procedure. Please do NOT wear cologne, perfume, aftershave, or lotions (deodorant is allowed). Please arrive 15 minutes prior to your appointment time.  Please note: We ask at that you not bring children with you during ultrasound (echo/ vascular) testing. Due to room size and safety concerns, children are not allowed in the ultrasound rooms during exams. Our front office staff cannot provide observation of children in our lobby area while testing is being conducted. An adult accompanying a patient to their appointment will only be allowed in the ultrasound room at the discretion of the ultrasound technician under special circumstances. We apologize for any inconvenience.    Follow-Up: At [facility], you and your health needs are our priority.  As part of our continuing mission to provide you with exceptional heart care, we have created designated Provider Care Teams.  These Care Teams include your primary Cardiologist (physician) and Advanced Practice Providers (APPs -  Physician Assistants and Nurse Practitioners) who all work together to provide you with the care you need, when you need it.   Your next appointment:   6 month(s)  Provider:   Dr. Antoine Poche     Other Instructions   1st Floor: - Lobby - Registration  - Pharmacy  - Lab - Cafe  2nd Floor: - PV Lab - Diagnostic Testing (echo, CT, nuclear med)  3rd Floor: - Vacant  4th Floor: - TCTS (cardiothoracic surgery) - AFib Clinic - Structural Heart Clinic - Vascular Surgery  - Vascular Ultrasound  5th Floor: - HeartCare  Cardiology (general and EP) - Clinical Pharmacy for coumadin, hypertension, lipid, weight-loss medications, and med management appointments    Valet parking services will be available as well.

## 2023-06-13 NOTE — Addendum Note (Signed)
 Addended by: Perlie Gold on: 06/13/2023 08:35 PM   Modules accepted: Level of Service

## 2023-06-15 ENCOUNTER — Encounter: Payer: Self-pay | Admitting: Family Medicine

## 2023-06-28 NOTE — Progress Notes (Signed)
 Patient scheduled for echo 07/05/23 per cards. Anesthesia would like that test to be completed before surgery at [facility]

## 2023-07-05 ENCOUNTER — Ambulatory Visit (HOSPITAL_COMMUNITY): Payer: Medicare Other | Attending: Cardiology

## 2023-07-05 DIAGNOSIS — R011 Cardiac murmur, unspecified: Secondary | ICD-10-CM | POA: Diagnosis present

## 2023-07-05 LAB — ECHOCARDIOGRAM COMPLETE
AR max vel: 3.07 cm2
AV Area VTI: 3.12 cm2
AV Area mean vel: 2.93 cm2
AV Mean grad: 4.5 mmHg
AV Peak grad: 8.1 mmHg
Ao pk vel: 1.43 m/s
Area-P 1/2: 3.08 cm2
S' Lateral: 3.2 cm

## 2023-07-06 ENCOUNTER — Encounter: Payer: Self-pay | Admitting: *Deleted

## 2023-07-09 ENCOUNTER — Other Ambulatory Visit: Payer: Self-pay

## 2023-07-09 ENCOUNTER — Encounter (HOSPITAL_BASED_OUTPATIENT_CLINIC_OR_DEPARTMENT_OTHER): Payer: Self-pay | Admitting: Orthopedic Surgery

## 2023-07-12 ENCOUNTER — Other Ambulatory Visit: Payer: Self-pay | Admitting: Orthopedic Surgery

## 2023-07-15 ENCOUNTER — Encounter (HOSPITAL_BASED_OUTPATIENT_CLINIC_OR_DEPARTMENT_OTHER): Payer: Self-pay | Admitting: Orthopedic Surgery

## 2023-07-15 ENCOUNTER — Ambulatory Visit (HOSPITAL_BASED_OUTPATIENT_CLINIC_OR_DEPARTMENT_OTHER)

## 2023-07-15 ENCOUNTER — Ambulatory Visit (HOSPITAL_BASED_OUTPATIENT_CLINIC_OR_DEPARTMENT_OTHER): Admitting: Anesthesiology

## 2023-07-15 ENCOUNTER — Encounter (HOSPITAL_BASED_OUTPATIENT_CLINIC_OR_DEPARTMENT_OTHER)
Admission: RE | Disposition: A | Discharge status: Home / Self Care | Payer: Self-pay | Source: Home / Self Care | Attending: Orthopedic Surgery

## 2023-07-15 ENCOUNTER — Other Ambulatory Visit: Payer: Self-pay

## 2023-07-15 ENCOUNTER — Ambulatory Visit (HOSPITAL_BASED_OUTPATIENT_CLINIC_OR_DEPARTMENT_OTHER)
Admission: RE | Admit: 2023-07-15 | Discharge: 2023-07-15 | Disposition: A | Discharge status: Home / Self Care | Payer: Medicare Other | Attending: Orthopedic Surgery | Admitting: Orthopedic Surgery

## 2023-07-15 DIAGNOSIS — Z472 Encounter for removal of internal fixation device: Secondary | ICD-10-CM

## 2023-07-15 DIAGNOSIS — J4520 Mild intermittent asthma, uncomplicated: Secondary | ICD-10-CM | POA: Insufficient documentation

## 2023-07-15 DIAGNOSIS — J45909 Unspecified asthma, uncomplicated: Secondary | ICD-10-CM | POA: Diagnosis not present

## 2023-07-15 DIAGNOSIS — T8484XA Pain due to internal orthopedic prosthetic devices, implants and grafts, initial encounter: Secondary | ICD-10-CM | POA: Diagnosis present

## 2023-07-15 DIAGNOSIS — I10 Essential (primary) hypertension: Secondary | ICD-10-CM | POA: Insufficient documentation

## 2023-07-15 DIAGNOSIS — M1990 Unspecified osteoarthritis, unspecified site: Secondary | ICD-10-CM | POA: Insufficient documentation

## 2023-07-15 DIAGNOSIS — Y831 Surgical operation with implant of artificial internal device as the cause of abnormal reaction of the patient, or of later complication, without mention of misadventure at the time of the procedure: Secondary | ICD-10-CM | POA: Diagnosis not present

## 2023-07-15 DIAGNOSIS — K219 Gastro-esophageal reflux disease without esophagitis: Secondary | ICD-10-CM | POA: Diagnosis not present

## 2023-07-15 DIAGNOSIS — Z969 Presence of functional implant, unspecified: Secondary | ICD-10-CM

## 2023-07-15 HISTORY — PX: HARDWARE REMOVAL: SHX979

## 2023-07-15 SURGERY — REMOVAL, HARDWARE
Anesthesia: General | Site: Ankle | Laterality: Left

## 2023-07-15 MED ORDER — LIDOCAINE 2% (20 MG/ML) 5 ML SYRINGE
INTRAMUSCULAR | Status: DC | PRN
  Administered 2023-07-15: 30 mg via INTRAVENOUS

## 2023-07-15 MED ORDER — CEFAZOLIN SODIUM-DEXTROSE 2-4 GM/100ML-% IV SOLN
INTRAVENOUS | Status: AC
  Filled 2023-07-15: qty 100

## 2023-07-15 MED ORDER — ONDANSETRON HCL 4 MG/2ML IJ SOLN
INTRAMUSCULAR | Status: DC | PRN
  Administered 2023-07-15: 4 mg via INTRAVENOUS

## 2023-07-15 MED ORDER — LIDOCAINE 2% (20 MG/ML) 5 ML SYRINGE
INTRAMUSCULAR | Status: AC
  Filled 2023-07-15: qty 5

## 2023-07-15 MED ORDER — FENTANYL CITRATE (PF) 100 MCG/2ML IJ SOLN
50.0000 ug | Freq: Once | INTRAMUSCULAR | Status: AC
  Administered 2023-07-15: 50 ug via INTRAVENOUS

## 2023-07-15 MED ORDER — 0.9 % SODIUM CHLORIDE (POUR BTL) OPTIME
TOPICAL | Status: DC | PRN
  Administered 2023-07-15: 200 mL

## 2023-07-15 MED ORDER — MIDAZOLAM HCL 2 MG/2ML IJ SOLN
INTRAMUSCULAR | Status: AC
  Filled 2023-07-15: qty 2

## 2023-07-15 MED ORDER — ONDANSETRON HCL 4 MG/2ML IJ SOLN
INTRAMUSCULAR | Status: AC
Start: 2023-07-15 — End: ?
  Filled 2023-07-15: qty 2

## 2023-07-15 MED ORDER — ACETAMINOPHEN 500 MG PO TABS
1000.0000 mg | ORAL_TABLET | Freq: Once | ORAL | Status: AC
  Administered 2023-07-15: 1000 mg via ORAL

## 2023-07-15 MED ORDER — ONDANSETRON HCL 4 MG/2ML IJ SOLN
INTRAMUSCULAR | Status: AC
  Filled 2023-07-15: qty 2

## 2023-07-15 MED ORDER — DROPERIDOL 2.5 MG/ML IJ SOLN: 0.6250 mg | Freq: Once | INTRAMUSCULAR | Status: DC | PRN

## 2023-07-15 MED ORDER — EPHEDRINE 5 MG/ML INJ
INTRAVENOUS | Status: AC
  Filled 2023-07-15: qty 10

## 2023-07-15 MED ORDER — FENTANYL CITRATE (PF) 100 MCG/2ML IJ SOLN
INTRAMUSCULAR | Status: AC
  Filled 2023-07-15: qty 2

## 2023-07-15 MED ORDER — OXYCODONE HCL 5 MG PO TABS: 5.0000 mg | ORAL_TABLET | Freq: Four times a day (QID) | ORAL | 0 refills | Status: DC | PRN

## 2023-07-15 MED ORDER — ACETAMINOPHEN 500 MG PO TABS
ORAL_TABLET | ORAL | Status: AC
  Filled 2023-07-15: qty 2

## 2023-07-15 MED ORDER — FENTANYL CITRATE (PF) 100 MCG/2ML IJ SOLN: 25.0000 ug | INTRAMUSCULAR | Status: DC | PRN

## 2023-07-15 MED ORDER — MIDAZOLAM HCL 2 MG/2ML IJ SOLN
1.0000 mg | Freq: Once | INTRAMUSCULAR | Status: AC
  Administered 2023-07-15: 1 mg via INTRAVENOUS

## 2023-07-15 MED ORDER — PROPOFOL 10 MG/ML IV BOLUS
INTRAVENOUS | Status: AC
  Filled 2023-07-15: qty 20

## 2023-07-15 MED ORDER — DEXAMETHASONE SODIUM PHOSPHATE 10 MG/ML IJ SOLN
INTRAMUSCULAR | Status: AC
Start: 2023-07-15 — End: ?
  Filled 2023-07-15: qty 1

## 2023-07-15 MED ORDER — DEXAMETHASONE SODIUM PHOSPHATE 10 MG/ML IJ SOLN
INTRAMUSCULAR | Status: AC
  Filled 2023-07-15: qty 1

## 2023-07-15 MED ORDER — DOCUSATE SODIUM 100 MG PO CAPS: 100.0000 mg | ORAL_CAPSULE | Freq: Two times a day (BID) | ORAL | 0 refills | Status: DC

## 2023-07-15 MED ORDER — FENTANYL CITRATE (PF) 100 MCG/2ML IJ SOLN
INTRAMUSCULAR | Status: DC | PRN
  Administered 2023-07-15: 50 ug via INTRAVENOUS

## 2023-07-15 MED ORDER — PROPOFOL 10 MG/ML IV BOLUS
INTRAVENOUS | Status: DC | PRN
  Administered 2023-07-15: 140 mg via INTRAVENOUS

## 2023-07-15 MED ORDER — LACTATED RINGERS IV SOLN: INTRAVENOUS | Status: DC

## 2023-07-15 MED ORDER — CEFAZOLIN SODIUM-DEXTROSE 2-4 GM/100ML-% IV SOLN
2.0000 g | INTRAVENOUS | Status: AC
  Administered 2023-07-15: 2 g via INTRAVENOUS

## 2023-07-15 MED ORDER — SENNA 8.6 MG PO TABS: 2.0000 | ORAL_TABLET | Freq: Two times a day (BID) | ORAL | 0 refills | Status: DC

## 2023-07-15 MED ORDER — EPHEDRINE SULFATE (PRESSORS) 50 MG/ML IJ SOLN
INTRAMUSCULAR | Status: DC | PRN
  Administered 2023-07-15 (×2): 10 mg via INTRAVENOUS

## 2023-07-15 MED ORDER — DEXAMETHASONE SODIUM PHOSPHATE 4 MG/ML IJ SOLN
INTRAMUSCULAR | Status: DC | PRN
  Administered 2023-07-15: 10 mg via INTRAVENOUS

## 2023-07-15 SURGICAL SUPPLY — 55 items
BANDAGE ESMARK 6X9 LF (GAUZE/BANDAGES/DRESSINGS) IMPLANT
BENZOIN TINCTURE PRP APPL 2/3 (GAUZE/BANDAGES/DRESSINGS) IMPLANT
BLADE SURG 15 STRL LF DISP TIS (BLADE) ×2 IMPLANT
BNDG ELASTIC 4INX 5YD STR LF (GAUZE/BANDAGES/DRESSINGS) IMPLANT
BNDG ELASTIC 6INX 5YD STR LF (GAUZE/BANDAGES/DRESSINGS) IMPLANT
BNDG ESMARK 4X9 LF (GAUZE/BANDAGES/DRESSINGS) IMPLANT
BNDG ESMARK 6X9 LF (GAUZE/BANDAGES/DRESSINGS) IMPLANT
BOOT STEPPER DURA XLG (SOFTGOODS) IMPLANT
CHLORAPREP W/TINT 26 (MISCELLANEOUS) ×1 IMPLANT
COVER BACK TABLE 60X90IN (DRAPES) ×1 IMPLANT
CUFF TRNQT CYL 34X4.125X (TOURNIQUET CUFF) IMPLANT
DRAPE EXTREMITY T 121X128X90 (DISPOSABLE) ×1 IMPLANT
DRAPE OEC MINIVIEW 54X84 (DRAPES) IMPLANT
DRAPE SURG 17X23 STRL (DRAPES) IMPLANT
DRAPE U-SHAPE 47X51 STRL (DRAPES) ×1 IMPLANT
DRSG MEPITEL 4X7.2 (GAUZE/BANDAGES/DRESSINGS) ×1 IMPLANT
ELECT REM PT RETURN 9FT ADLT (ELECTROSURGICAL) ×1 IMPLANT
ELECTRODE REM PT RTRN 9FT ADLT (ELECTROSURGICAL) ×1 IMPLANT
GAUZE PAD ABD 8X10 STRL (GAUZE/BANDAGES/DRESSINGS) IMPLANT
GAUZE SPONGE 4X4 12PLY STRL (GAUZE/BANDAGES/DRESSINGS) ×1 IMPLANT
GLOVE BIO SURGEON STRL SZ8 (GLOVE) ×1 IMPLANT
GLOVE BIOGEL PI IND STRL 7.0 (GLOVE) IMPLANT
GLOVE BIOGEL PI IND STRL 8 (GLOVE) ×2 IMPLANT
GLOVE ECLIPSE 8.0 STRL XLNG CF (GLOVE) ×1 IMPLANT
GLOVE INDICATOR 7.5 STRL GRN (GLOVE) IMPLANT
GLOVE SURG SS PI 7.0 STRL IVOR (GLOVE) IMPLANT
GOWN STRL REUS W/ TWL LRG LVL3 (GOWN DISPOSABLE) ×2 IMPLANT
GOWN STRL REUS W/ TWL XL LVL3 (GOWN DISPOSABLE) ×2 IMPLANT
GUIDEWIRE THREADED 2.8 (WIRE) IMPLANT
NDL HYPO 22X1.5 SAFETY MO (MISCELLANEOUS) IMPLANT
NEEDLE HYPO 22X1.5 SAFETY MO (MISCELLANEOUS) IMPLANT
PACK BASIN DAY SURGERY FS (CUSTOM PROCEDURE TRAY) ×1 IMPLANT
PAD CAST 4YDX4 CTTN HI CHSV (CAST SUPPLIES) ×1 IMPLANT
PADDING CAST COTTON 6X4 STRL (CAST SUPPLIES) IMPLANT
PENCIL SMOKE EVACUATOR (MISCELLANEOUS) ×1 IMPLANT
SANITIZER HAND PURELL FF 515ML (MISCELLANEOUS) ×1 IMPLANT
SHEET MEDIUM DRAPE 40X70 STRL (DRAPES) ×1 IMPLANT
SLEEVE SCD COMPRESS KNEE MED (STOCKING) ×2 IMPLANT
SPIKE FLUID TRANSFER (MISCELLANEOUS) IMPLANT
SPLINT PLASTER CAST FAST 5X30 (CAST SUPPLIES) IMPLANT
SPONGE T-LAP 18X18 ~~LOC~~+RFID (SPONGE) ×1 IMPLANT
STOCKINETTE 6 STRL (DRAPES) ×1 IMPLANT
STRIP CLOSURE SKIN 1/2X4 (GAUZE/BANDAGES/DRESSINGS) IMPLANT
SUCTION TUBE FRAZIER 10FR DISP (SUCTIONS) IMPLANT
SUT ETHILON 3 0 PS 1 (SUTURE) IMPLANT
SUT MNCRL AB 3-0 PS2 18 (SUTURE) ×2 IMPLANT
SUT VIC AB 2-0 SH 18 (SUTURE) IMPLANT
SUT VIC AB 2-0 SH 27XBRD (SUTURE) IMPLANT
SUT VICRYL 0 SH 27 (SUTURE) IMPLANT
SYR BULB EAR ULCER 3OZ GRN STR (SYRINGE) ×1 IMPLANT
SYR CONTROL 10ML LL (SYRINGE) IMPLANT
TOWEL GREEN STERILE FF (TOWEL DISPOSABLE) ×2 IMPLANT
TUBE CONNECTING 20X1/4 (TUBING) IMPLANT
UNDERPAD 30X36 HEAVY ABSORB (UNDERPADS AND DIAPERS) ×1 IMPLANT
YANKAUER SUCT BULB TIP NO VENT (SUCTIONS) IMPLANT

## 2023-07-15 NOTE — Anesthesia Preprocedure Evaluation (Addendum)
 Anesthesia Evaluation  Patient identified by MRN, date of birth, ID band Patient awake    Reviewed: Allergy & Precautions, NPO status , Patient's Chart, lab work & pertinent test results  History of Anesthesia Complications Negative for: history of anesthetic complications  Airway Mallampati: II  TM Distance: >3 FB Neck ROM: Full    Dental no notable dental hx. (+) Dental Advisory Given, Teeth Intact   Pulmonary asthma    Pulmonary exam normal breath sounds clear to auscultation       Cardiovascular hypertension, Pt. on medications + Peripheral Vascular Disease  Normal cardiovascular exam Rhythm:Regular Rate:Normal   '23 TTE - EF 60 to 65%. Right atrial size was mildly dilated. Trivial mitral valve regurgitation. Aortic valve regurgitation is trivial. There is mild dilatation of the ascending aorta, measuring 38 mm.     Neuro/Psych  Neuromuscular disease (polio)  negative psych ROS   GI/Hepatic Neg liver ROS, hiatal hernia,GERD  Medicated and Controlled,, Food impaction Hx previous dilatations    Endo/Other  negative endocrine ROS    Renal/GU negative Renal ROS     Musculoskeletal  (+) Arthritis ,    Abdominal   Peds  Hematology negative hematology ROS (+)   Anesthesia Other Findings   Reproductive/Obstetrics                             Anesthesia Physical Anesthesia Plan  ASA: 3  Anesthesia Plan: General   Post-op Pain Management: Tylenol PO (pre-op)* and Regional block*   Induction: Intravenous, Rapid sequence and Cricoid pressure planned  PONV Risk Score and Plan: 2 and Treatment may vary due to age or medical condition, Ondansetron and Dexamethasone  Airway Management Planned: Oral ETT and LMA  Additional Equipment: None  Intra-op Plan:   Post-operative Plan: Extubation in OR  Informed Consent: I have reviewed the patients History and Physical, chart, labs and  discussed the procedure including the risks, benefits and alternatives for the proposed anesthesia with the patient or authorized representative who has indicated his/her understanding and acceptance.     Dental advisory given  Plan Discussed with: CRNA  Anesthesia Plan Comments: (Risks of anesthesia explained at length. This includes, but is not limited to, sore throat, damage to teeth, lips gums, tongue and vocal cords, nausea and vomiting, reactions to medications, stroke, heart attack, and death. All patient questions were answered and the patient wishes to proceed.  Risks of peripheral nerve block explained at length. This includes, but is not limited to, bleeding, infection, reactions to the medications, seizures, damage to surrounding structures, damage to nerves, permanent weakness, numbness, tingling and pain. All patient questions were answered and patient wishes to proceed with nerve block. )        Anesthesia Quick Evaluation

## 2023-07-15 NOTE — Anesthesia Procedure Notes (Signed)
 Procedure Name: LMA Insertion Date/Time: 07/15/2023 11:01 AM  Performed by: Roosvelt Harps, CRNAPre-anesthesia Checklist: Patient identified, Emergency Drugs available, Suction available and Patient being monitored Patient Re-evaluated:Patient Re-evaluated prior to induction Oxygen Delivery Method: Circle System Utilized Preoxygenation: Pre-oxygenation with 100% oxygen Induction Type: IV induction Ventilation: Mask ventilation without difficulty LMA: LMA inserted LMA Size: 5.0 Number of attempts: 1 Airway Equipment and Method: Bite block Placement Confirmation: positive ETCO2 Tube secured with: Tape Dental Injury: Teeth and Oropharynx as per pre-operative assessment

## 2023-07-15 NOTE — Discharge Instructions (Addendum)
 Toni Arthurs, MD [facility]  Please read the following information regarding your care after surgery.  Medications  You only need a prescription for the narcotic pain medicine (ex. oxycodone, Percocet, Norco).  All of the other medicines listed below are available over the counter. ? Aleve 2 pills twice a day for the first 3 days after surgery. ? acetominophen (Tylenol) 650 mg every 4-6 hours as you need for minor to moderate pain ? oxycodone as prescribed for severe pain  Narcotic pain medicine (ex. oxycodone, Percocet, Vicodin) will cause constipation.  To prevent this problem, take the following medicines while you are taking any pain medicine. ? docusate sodium (Colace) 100 mg twice a day ? senna (Senokot) 2 tablets twice a day  Weight Bearing ? Bear weight when you are able on your operated leg or foot.   Cast / Splint / Dressing ? Remove your dressing 3 days after surgery and cover the incisions with dry dressings.    After your dressing, cast or splint is removed; you may shower, but do not soak or scrub the wound.  Allow the water to run over it, and then gently pat it dry.  Swelling It is normal for you to have swelling where you had surgery.  To reduce swelling and pain, keep your toes above your nose for at least 3 days after surgery.  It may be necessary to keep your foot or leg elevated for several weeks.  If it hurts, it should be elevated.  Follow Up Call my office at [phone] when you are discharged from the hospital or surgery center to schedule an appointment to be seen two weeks after surgery.  Call my office at [phone] if you develop a fever >101.5 F, nausea, vomiting, bleeding from the surgical site or severe pain.       Post Anesthesia Home Care Instructions  Activity: Get plenty of rest for the remainder of the day. A responsible individual must stay with you for 24 hours following the procedure.  For the next 24 hours, DO NOT: -Drive a  car -Advertising copywriter -Drink alcoholic beverages -Take any medication unless instructed by your physician -Make any legal decisions or sign important papers.  Meals: Start with liquid foods such as gelatin or soup. Progress to regular foods as tolerated. Avoid greasy, spicy, heavy foods. If nausea and/or vomiting occur, drink only clear liquids until the nausea and/or vomiting subsides. Call your physician if vomiting continues.  Special Instructions/Symptoms: Your throat may feel dry or sore from the anesthesia or the breathing tube placed in your throat during surgery. If this causes discomfort, gargle with warm salt water. The discomfort should disappear within 24 hours.  If you had a scopolamine patch placed behind your ear for the management of post- operative nausea and/or vomiting:  1. The medication in the patch is effective for 72 hours, after which it should be removed.  Wrap patch in a tissue and discard in the trash. Wash hands thoroughly with soap and water. 2. You may remove the patch earlier than 72 hours if you experience unpleasant side effects which may include dry mouth, dizziness or visual disturbances. 3. Avoid touching the patch. Wash your hands with soap and water after contact with the patch.     Regional Anesthesia Blocks  1. You may not be able to move or feel the "blocked" extremity after a regional anesthetic block. This may last may last from 3-48 hours after placement, but it will go away. The length  of time depends on the medication injected and your individual response to the medication. As the nerves start to wake up, you may experience tingling as the movement and feeling returns to your extremity. If the numbness and inability to move your extremity has not gone away after 48 hours, please call your surgeon.   2. The extremity that is blocked will need to be protected until the numbness is gone and the strength has returned. Because you cannot feel it, you  will need to take extra care to avoid injury. Because it may be weak, you may have difficulty moving it or using it. You may not know what position it is in without looking at it while the block is in effect.  3. For blocks in the legs and feet, returning to weight bearing and walking needs to be done carefully. You will need to wait until the numbness is entirely gone and the strength has returned. You should be able to move your leg and foot normally before you try and bear weight or walk. You will need someone to be with you when you first try to ensure you do not fall and possibly risk injury.  4. Bruising and tenderness at the needle site are common side effects and will resolve in a few days.  5. Persistent numbness or new problems with movement should be communicated to the surgeon or the [facility] [phone] [facility] [phone]).  May have Tylenol today after 3:33 PM

## 2023-07-15 NOTE — H&P (Signed)
 Wayne Brandt is an 73 y.o. male.   Chief Complaint: Left ankle pain HPI: 73 year old male without significant past medical history complains of worsening left ankle and hindfoot pain.  He has previously had an ankle arthrodesis followed by a subtalar arthrodesis.  He has painful hardware distal tibia and fibula.  He has failed nonoperative treatment and presents today for removal of the deep implants from his left tibia and fibula.  Past Medical History:  Diagnosis Date   Actinic keratosis    Dr. Sharyn Lull   Arthritis    wrists, L thumb   BPH (benign prostatic hyperplasia)    ED (erectile dysfunction)    Family history of premature CAD    GERD (gastroesophageal reflux disease)    HH (hiatus hernia)    History of basal cell carcinoma excision    2013-- chest area   History of esophagogastroduodenoscopy (EGD) 12/06/2017   Dr.Medoff-distal esophageal stricture,hiatal hernia,gastritis and duodenitis.   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   S/P dilatation of esophageal stricture    multiple times   SCC (squamous cell carcinoma) 2020   face   Urinary retention     Past Surgical History:  Procedure Laterality Date   ANKLE FUSION Left 1997   BAND HEMORRHOIDECTOMY     Dr. Kinnie Scales   BIOPSY  10/26/2022   Procedure: BIOPSY;  Surgeon: Iva Boop, MD;  Location: [facility];  Service: Gastroenterology;;   CARDIOVASCULAR STRESS TEST  2006   normal--Dr. Antoine Poche   CATARACT EXTRACTION Right 04/08/2020   CATARACT EXTRACTION Left 05/20/2020   COLONOSCOPY, ESOPHAGOGASTRODUODENOSCOPY (EGD) AND ESOPHAGEAL DILATION  02/15/2014   Dr.Medoff   ESOPHAGEAL DILATION  02/08/2017   Dr. Kinnie Scales.  EGD showed stricture and HH   ESOPHAGOGASTRODUODENOSCOPY  02/04/2017   ESOPHAGOGASTRODUODENOSCOPY N/A 10/26/2022   Procedure: ESOPHAGOGASTRODUODENOSCOPY (EGD);  Surgeon: Iva Boop, MD;   Location: [facility];  Service: Gastroenterology;  Laterality: N/A;   ESOPHAGOGASTRODUODENOSCOPY ENDOSCOPY  02/08/2017   FOREIGN BODY REMOVAL  10/26/2022   Procedure: FOREIGN BODY REMOVAL;  Surgeon: Iva Boop, MD;  Location: [facility];  Service: Gastroenterology;;   I & D RIGHT KNEE W/ REMOVAL PATELLA WIRES  02/09/2007   KNEE ARTHROSCOPY Right 07/07/2000   post-op  07-09-2000  right knee arthroscopy w/ lavage and debridement hemathrosis/ pyathrosis   LEFT FOOT SURGERY  x2  as child   polio   NEUROPLASTY / TRANSPOSITION ULNAR NERVE AT ELBOW Right 01/04/2008   ORIF RIGHT PERIPROSTHEITC PATELLA FX  11/01/2006   REVERSE SHOULDER ARTHROPLASTY Right 03/23/2019   Procedure: REVERSE SHOULDER ARTHROPLASTY;  Surgeon: Francena Hanly, MD;  Location: [facility];  Service: Orthopedics;  Laterality: Right;    TOTAL KNEE ARTHROPLASTY Right 02/22/2006   TRANSURETHRAL RESECTION OF PROSTATE N/A 06/08/2016   Procedure: TRANSURETHRAL RESECTION OF THE PROSTATE (TURP);  Surgeon: Ihor Gully, MD;  Location: [facility];  Service: Urology;  Laterality: N/A;   UPPER GI ENDOSCOPY  11/21/2018   hiatal hernia, gastritis, duodenitis   WRIST ARTHROSCOPY WITH DEBRIDEMENT Right 03/30/2001    Family History  Problem Relation Age of Onset   Alcohol abuse Mother    Cancer Mother        male cancer in 50's   Heart disease Father 44       MI   Cancer Brother    Heart  disease Brother 66       MI   Glaucoma Brother    Testicular cancer Brother 22       testicle removed, pt thinks cancer   Pulmonary fibrosis Brother    Heart disease Brother 9       stents   Hypertension Son    Diabetes Neg Hx    Colon cancer Neg Hx    Social History:  reports that he has never smoked. He has never used smokeless tobacco. He reports current alcohol use of about 10.0 standard drinks of alcohol per week. He reports that he does not use drugs.  Allergies: No Known Allergies  Medications Prior to  Admission  Medication Sig Dispense Refill   Cholecalciferol (VITAMIN D) 50 MCG (2000 UT) CAPS Take 1 capsule by mouth daily.     Coenzyme Q10 (COQ10) 100 MG CAPS Take 1 capsule by mouth daily.     fish oil-omega-3 fatty acids 1000 MG capsule Take 1 g by mouth daily.     GEMTESA 75 MG TABS Take 1 tablet by mouth daily.     losartan (COZAAR) 100 MG tablet TAKE 1 TABLET(100 MG) BY MOUTH DAILY 90 tablet 3   omeprazole (PRILOSEC) 40 MG capsule Take 40 mg by mouth daily.     Pyridoxine HCl (VITAMIN B-6) 500 MG tablet Take 500 mg by mouth daily.     rosuvastatin (CRESTOR) 5 MG tablet Take 1 tablet (5 mg total) by mouth daily. 90 tablet 3   tadalafil (CIALIS) 5 MG tablet Take 5 mg by mouth daily as needed for erectile dysfunction.     vitamin B-12 (CYANOCOBALAMIN) 1000 MCG tablet Take 1,000 mcg by mouth daily.     vitamin C (ASCORBIC ACID) 500 MG tablet Take 500 mg by mouth daily.     albuterol (VENTOLIN HFA) 108 (90 Base) MCG/ACT inhaler Inhale 2 puffs into the lungs every 6 (six) hours as needed for wheezing. Use 30 mins prior to exercise 18 g 0   amoxicillin (AMOXIL) 500 MG tablet as directed. Dental appts     fluorouracil (EFUDEX) 5 % cream      tiZANidine (ZANAFLEX) 4 MG tablet Take 1 tablet (4 mg total) by mouth every 8 (eight) hours as needed for muscle spasms. 20 tablet 0    No results found for this or any previous visit (from the past 48 hours). No results found.  Review of Systems no recent fever, chills, nausea, vomiting or changes in his appetite  Height 5\' 8"  (1.727 m), weight 74.4 kg. Physical Exam  Well-nourished well-built man in no apparent distress.  Alert and oriented.  Normal mood and affect.  Gait is antalgic to the left.  The left ankle has healed surgical incisions medially and laterally.  He has pain with attempted motion in dorsiflexion and plantarflexion as well as inversion and eversion.  His foot has palpable pulses.  Intact sensibility to light touch in the deep and  superficial peroneal nerve distributions, sural and saphenous nerve distributions, medial and lateral plantar nerve distributions.  Active plantarflexion and dorsiflexion strength at the toes.  No lymphadenopathy.   Assessment/Plan Painful hardware left distal tibia and fibula -to the operating room today for removal of deep implants.  The risks and benefits of the alternative treatment options have been discussed in detail.  The patient wishes to proceed with surgery and specifically understands risks of bleeding, infection, nerve damage, blood clots, need for additional surgery, amputation and death.   Toni Arthurs,  MD 07/15/2023, 9:19 AM

## 2023-07-15 NOTE — Op Note (Signed)
 07/15/2023  11:36 AM  PATIENT:  Wayne Brandt  73 y.o. male  PRE-OPERATIVE DIAGNOSIS:   1.  Painful hardware left tibia      2.  Painful hardware left fibula  POST-OPERATIVE DIAGNOSIS: Same  Procedure(s): 1.  Removal of deep implant left tibia   2.  Removal of deep implant left fibula (separate incision   3.  AP and lateral xrays of the left ankle  SURGEON:  Toni Arthurs, MD  ASSISTANT: Alfredo Martinez, PA-C  ANESTHESIA:   General, regional  EBL:  minimal   TOURNIQUET:   Total Tourniquet Time Documented: Thigh (Left) - 21 minutes Total: Thigh (Left) - 21 minutes  COMPLICATIONS:  None apparent  DISPOSITION:  Extubated, awake and stable to recovery.  INDICATION FOR PROCEDURE:  73 y/o male without significant PMH c/o pain at the left ankle (tibia and fibula) after ankle and subtalar arthrodeses.  He has failed non op treatment and presents today for removal of the painful hardware.  The risks and benefits of the alternative treatment options have been discussed in detail.  The patient wishes to proceed with surgery and specifically understands risks of bleeding, infection, nerve damage, blood clots, need for additional surgery, amputation and death.   PROCEDURE IN DETAIL:  After pre operative consent was obtained, and the correct operative site was identified, the patient was brought to the operating room and placed supine on the OR table.  Anesthesia was administered.  Pre-operative antibiotics were administered.  A surgical timeout was taken.  The left lower extremity was prepped and draped in standard sterile fashion with a tourniquet around the thigh.  The extremity was elevated, and the tourniquet was inflated to 250 mmHg.  The screw head in the medial tibia was identified on AP and lateral radiographs.  A small incision was made proximal to the bony prominence.  A guidepin was then inserted under fluoroscopic guidance into the screw head.  Overgrown bone was removed with a  cannulated countersink.  The screwdriver was then inserted into the screw head.  The screw was removed without difficulty.  Attention was turned to the lateral aspect of the ankle.  The screw in the fibula was identified with AP and lateral radiographs.  A small incision was made.  The guidepin was inserted into the screw under fluoroscopic guidance.  The countersink was again used to remove the overgrown bone.  Screwdriver was inserted.  The screw was removed without difficulty.  Final AP and lateral radiographs confirmed complete removal of all radiopaque hardware.  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  Both wounds were then irrigated copiously and closed with nylon.  Sterile dressings were applied followed by compression wrap.  The tourniquet was released after application of the dressings.  The patient was awakened from anesthesia and transported to the recovery room in stable condition.   FOLLOW UP PLAN: Weightbearing as tolerated on the left lower extremity.  Follow-up in 2 weeks for suture removal.  No indication for DVT prophylaxis in this ambulatory patient.   RADIOGRAPHS: AP and lateral radiographs of the left ankle are obtained intraoperatively.  These show interval removal of screws from the tibia and fibula.  Nonunion of the subtalar joint is again noted.  No other acute injuries.    Alfredo Martinez PA-C was present and scrubbed for the duration of the operative case. His assistance was essential in positioning the patient, prepping and draping, gaining and maintaining exposure, performing the operation, closing and dressing the  wounds and applying the splint.

## 2023-07-15 NOTE — Transfer of Care (Signed)
 Immediate Anesthesia Transfer of Care Note  Patient: Wayne Brandt  Procedure(s) Performed: HARDWARE REMOVAL OF LEFT FIBULA AND TIBIA (Left: Ankle)  Patient Location: PACU  Anesthesia Type:General  Level of Consciousness: drowsy  Airway & Oxygen Therapy: Patient Spontanous Breathing and Patient connected to face mask oxygen  Post-op Assessment: Report given to RN and Post -op Vital signs reviewed and stable  Post vital signs: Reviewed and stable  Last Vitals:  Vitals Value Taken Time  BP 127/69 07/15/23 1140  Temp    Pulse 50 07/15/23 1141  Resp 9 07/15/23 1141  SpO2 99 % 07/15/23 1141  Vitals shown include unfiled device data.  Last Pain:  Vitals:   07/15/23 0929  TempSrc: Temporal  PainSc: 0-No pain      Patients Stated Pain Goal: 3 (07/15/23 0929)  Complications: No notable events documented.

## 2023-07-15 NOTE — Progress Notes (Signed)
Assisted Dr. Lissa Hoard with left, adductor canal, popliteal, ultrasound guided block. Side rails up, monitors on throughout procedure. See vital signs in flow sheet. Tolerated Procedure well.

## 2023-07-16 ENCOUNTER — Encounter (HOSPITAL_BASED_OUTPATIENT_CLINIC_OR_DEPARTMENT_OTHER): Payer: Self-pay | Admitting: Orthopedic Surgery

## 2023-07-16 NOTE — Anesthesia Postprocedure Evaluation (Signed)
 Anesthesia Post Note  Patient: Wayne Brandt  Procedure(s) Performed: HARDWARE REMOVAL OF LEFT FIBULA AND TIBIA (Left: Ankle)     Patient location during evaluation: PACU Anesthesia Type: General Level of consciousness: sedated and patient cooperative Pain management: pain level controlled Vital Signs Assessment: post-procedure vital signs reviewed and stable Respiratory status: spontaneous breathing Cardiovascular status: stable Anesthetic complications: no   No notable events documented.  Last Vitals:  Vitals:   07/15/23 1215 07/15/23 1246  BP:  (!) 163/63  Pulse: (!) 56 (!) 56  Resp: 13 20  Temp:  (!) 36.3 C  SpO2: 98% 96%    Last Pain:  Vitals:   07/15/23 1246  TempSrc: Temporal  PainSc: 0-No pain                 Lewie Loron

## 2023-08-09 ENCOUNTER — Telehealth: Admitting: Family Medicine

## 2023-08-09 ENCOUNTER — Ambulatory Visit: Payer: Self-pay

## 2023-08-09 ENCOUNTER — Ambulatory Visit

## 2023-08-09 ENCOUNTER — Encounter: Payer: Self-pay | Admitting: Family Medicine

## 2023-08-09 VITALS — Temp 96.7°F | Ht 68.0 in | Wt 165.0 lb

## 2023-08-09 DIAGNOSIS — H903 Sensorineural hearing loss, bilateral: Secondary | ICD-10-CM

## 2023-08-09 DIAGNOSIS — J069 Acute upper respiratory infection, unspecified: Secondary | ICD-10-CM | POA: Diagnosis not present

## 2023-08-09 DIAGNOSIS — H6992 Unspecified Eustachian tube disorder, left ear: Secondary | ICD-10-CM | POA: Diagnosis not present

## 2023-08-09 NOTE — Progress Notes (Signed)
 Start time: 1:30 End time:1:48  Virtual Visit via Video Note  I connected with Wayne Brandt on 08/09/23 by a video enabled telemedicine application and verified that I am speaking with the correct person using two identifiers.  Location: Patient: home Provider: office   I discussed the limitations of evaluation and management by telemedicine and the availability of in person appointments. The patient expressed understanding and agreed to proceed.  History of Present Illness:  Chief Complaint  Patient presents with   Cough    VIRTUAL cough, ST that started on Thursday. Cough and congestion Friday. Saturday he barely slept due to cough, laying down makes it much worse. Has not done any covid testing.    Started feeling crummy on Wednesday afternoon (4/23). Thursday he started getting hoarse, followed by cough. L ear feels clogged--that started back again yesterday. He isn't getting anything from his nose, is very congested. He has PND. Sometimes it is light yellow, sometimes it is clear.  It tasted horribly initially, better in the last 2 days. Denies sinus pain. Some chills, no fever. Denies wheezing, shortness of breath.  He does sinus rinses about 5 days/week, all clear.  Only gets up something thicker if he hasn't done it in a bunch of days.  No sick contacts.  He was using Fast max DM syrup every 4 hours, which seemed to help.  Stopped this yesterday. He since bought Mucinex DM 12 hour--took one this morning He had to take hydrocodone  cough syrup last night. (Still has a lot at home) Didn't sleep Saturday night due to cough.  He has been using flonase for almost a month.  Doesn't seem to help. Started this mainly for his ear. Chronic issues with L ear feeling clogged.  He was supposed to get MRI today, but had to cancel due to illness/cough. He rescheduled.   PMH, PSH, SH reviewed  Outpatient Encounter Medications as of 08/09/2023  Medication Sig Note    acetaminophen  (TYLENOL ) 500 MG tablet Take 500 mg by mouth every 6 (six) hours as needed. 08/09/2023: Last dose yesterday   Cholecalciferol (VITAMIN D ) 50 MCG (2000 UT) CAPS Take 1 capsule by mouth daily.    Coenzyme Q10 (COQ10) 100 MG CAPS Take 1 capsule by mouth daily.    Dextromethorphan-guaiFENesin (MUCINEX FAST-MAX DM MAX PO) Take 30 mLs by mouth every 4 (four) hours. 08/09/2023: Last dose yesterday   fish oil-omega-3 fatty acids 1000 MG capsule Take 1 g by mouth daily.    fluticasone (FLONASE) 50 MCG/ACT nasal spray Place 2 sprays into both nostrils daily.    guaiFENesin (MUCINEX) 600 MG 12 hr tablet Take 600 mg by mouth 2 (two) times daily. 08/09/2023: Took one this am   losartan  (COZAAR ) 100 MG tablet TAKE 1 TABLET(100 MG) BY MOUTH DAILY    omeprazole  (PRILOSEC) 40 MG capsule Take 40 mg by mouth daily.    Pyridoxine HCl (VITAMIN B-6) 500 MG tablet Take 500 mg by mouth daily.    rosuvastatin  (CRESTOR ) 5 MG tablet Take 1 tablet (5 mg total) by mouth daily.    vitamin B-12 (CYANOCOBALAMIN) 1000 MCG tablet Take 1,000 mcg by mouth daily.    vitamin C (ASCORBIC ACID) 500 MG tablet Take 500 mg by mouth daily.    albuterol  (VENTOLIN  HFA) 108 (90 Base) MCG/ACT inhaler Inhale 2 puffs into the lungs every 6 (six) hours as needed for wheezing. Use 30 mins prior to exercise (Patient not taking: Reported on 08/09/2023) 08/09/2023: As needed   amoxicillin  (  AMOXIL ) 500 MG tablet as directed. Dental appts (Patient not taking: Reported on 08/09/2023) 08/09/2023: Prior to dental appts   fluorouracil (EFUDEX) 5 % cream  (Patient not taking: Reported on 08/09/2023) 08/09/2023: As needed   GEMTESA 75 MG TABS Take 1 tablet by mouth daily. (Patient not taking: Reported on 08/09/2023) 08/09/2023: rarely   tadalafil (CIALIS) 5 MG tablet Take 5 mg by mouth daily as needed for erectile dysfunction. (Patient not taking: Reported on 08/09/2023) 08/09/2023: As needed   [DISCONTINUED] docusate sodium  (COLACE) 100 MG capsule Take 1  capsule (100 mg total) by mouth 2 (two) times daily. While taking narcotic pain medicine.    [DISCONTINUED] oxyCODONE  (ROXICODONE ) 5 MG immediate release tablet Take 1 tablet (5 mg total) by mouth every 6 (six) hours as needed.    [DISCONTINUED] senna (SENOKOT) 8.6 MG TABS tablet Take 2 tablets (17.2 mg total) by mouth 2 (two) times daily.    [DISCONTINUED] tiZANidine  (ZANAFLEX ) 4 MG tablet Take 1 tablet (4 mg total) by mouth every 8 (eight) hours as needed for muscle spasms. (Patient not taking: Reported on 08/09/2023)    No facility-administered encounter medications on file as of 08/09/2023.   No Known Allergies  ROS: no fever, some chills.  +URI symptoms per HPI, including L ear plugging, hoarseness, nasal congestion/PND and cough. No shortness of breath or wheezing.  Nauseated this morning, not since. No vomiting or diarrhea. No bleeding, bruising, rash, No chest pain.     Observations/Objective:  Temp (!) 96.7 F (35.9 C) (Oral)   Ht 5\' 8"  (1.727 m)   Wt 165 lb (74.8 kg)   BMI 25.09 kg/m   Well-appearing male, in good spirits. Not ill-appearing. Sounds nasally congested.  No hoarseness, voice sounds normal. Speaking comfortably, in no distress. Coughed once during the visit, sounded dry. I mostly saw his ear up close, as he had the phone to his ear to hear better.    Assessment and Plan:  Viral upper respiratory illness - cont Mucinex DM, sinus rinses, antihistamine prn. Nothing to suggest bacterial infection at this point. Sx reviewed  Eustachian tube dysfunction, left - cont flonase  Asymmetrical sensorineural hearing loss - MRI of the IAC to be r/s'd to r/o any tumors or growths due to the asymmetry  Take claritin daily. Continue to take the Mucinex DM 12 hour twice daily (and NOT take any of the FastMax). Use 1 teaspoon of the hydrocodone  syrup only at bedtime if the cough is keeping you awake.  Use that sparingly due to the sedation, constipation, and other  risks. Stay well hydrated. Continue sinus rinses.  Contact us  if you develop fever, sinus pain, discolored mucus or phlegm, shortness of breath or pain with breathing, worsening ear pain.  We would want an in-person evaluation in order to listen to your lungs and see your ear.  Use tylenol  as per directions on the bottle, as needed for pain.    Follow Up Instructions:    I discussed the assessment and treatment plan with the patient. The patient was provided an opportunity to ask questions and all were answered. The patient agreed with the plan and demonstrated an understanding of the instructions.   The patient was advised to call back or seek an in-person evaluation if the symptoms worsen or if the condition fails to improve as anticipated.  I spent 22 minutes dedicated to the care of this patient, including pre-visit review of records, face to face time, post-visit ordering of testing and documentation.  Paulett Boros, MD

## 2023-08-09 NOTE — Patient Instructions (Signed)
 Take claritin daily. Continue to take the Mucinex DM 12 hour twice daily (and NOT take any of the FastMax). Use 1 teaspoon of the hydrocodone  syrup only at bedtime if the cough is keeping you awake.  Use that sparingly due to the sedation, constipation, and other risks. Stay well hydrated. Continue sinus rinses.  Contact us  if you develop fever, sinus pain, discolored mucus or phlegm, shortness of breath or pain with breathing, worsening ear pain.  We would want an in-person evaluation in order to listen to your lungs and see your ear.  Use tylenol  as per directions on the bottle, as needed for pain.

## 2023-08-09 NOTE — Telephone Encounter (Signed)
 Copied from CRM [id number]. Topic: Clinical - Red Word Triage >> Aug 09, 2023  8:08 AM Oddis Bench wrote: Red Word that prompted transfer to Nurse Triage: Patient is stating that he has a sore throat, coughing and congestion, He feels nauseated  and coughing up mucus Yellow.   Chief Complaint: Productive cough, Sore throat, bilateral ear aches Symptoms: Sore throat, Productive cough, bilateral ear aches, hoarse voice Frequency: 4 days ago Pertinent Negatives: Patient denies fever, difficulty breathing, vomiting, diarrhea, chest pain Disposition: [] ED /[x] Urgent Care (no appt availability in office) / [] Appointment(In office/virtual)/ []  [facility]/ [] Home Care/ [] Refused Recommended Disposition /[] [facility]/ []  Follow-up with PCP Additional Notes: Patient called and advised that he has been having a productive cough, sore throat, bilateral ear aches, hoarse voice Patient denies fever, difficulty breathing, vomiting, diarrhea, chest pain.  He advised that he was having coughing spells and coughing up light yellow and clear phlegm. Patient is given Care Advice as per protocol. Patient is advised that there are no openings today at his PCP office and Urgent Care is recommended. Patient states that there is a [facility] nearby that he will go to for further evaluation today.    Reason for Disposition  [1] Continuous (nonstop) coughing interferes with work or school AND [2] no improvement using cough treatment per Care Advice  Answer Assessment - Initial Assessment Questions 1. ONSET: "When did the cough begin?"      4 days ago 2. SEVERITY: "How bad is the cough today?"      consistent 3. SPUTUM: "Describe the color of your sputum" (none, dry cough; clear, white, yellow, green)     Light yellow, clear 4. HEMOPTYSIS: "Are you coughing up any blood?" If so ask: "How much?" (flecks, streaks, tablespoons, etc.)     No 5. DIFFICULTY BREATHING: "Are you having  difficulty breathing?" If Yes, ask: "How bad is it?" (e.g., mild, moderate, severe)    - MILD: No SOB at rest, mild SOB with walking, speaks normally in sentences, can lie down, no retractions, pulse < 100.    - MODERATE: SOB at rest, SOB with minimal exertion and prefers to sit, cannot lie down flat, speaks in phrases, mild retractions, audible wheezing, pulse 100-120.    - SEVERE: Very SOB at rest, speaks in single words, struggling to breathe, sitting hunched forward, retractions, pulse > 120      Patient Denies 6. FEVER: "Do you have a fever?" If Yes, ask: "What is your temperature, how was it measured, and when did it start?"     No 7. CARDIAC HISTORY: "Do you have any history of heart disease?" (e.g., heart attack, congestive heart failure)      No 8. LUNG HISTORY: "Do you have any history of lung disease?"  (e.g., pulmonary embolus, asthma, emphysema)     Asthma as a child 9. PE RISK FACTORS: "Do you have a history of blood clots?" (or: recent major surgery, recent prolonged travel, bedridden)     No 10. OTHER SYMPTOMS: "Do you have any other symptoms?" (e.g., runny nose, wheezing, chest pain)       Sore throat, hoarse voice, ears both hurt 12. TRAVEL: "Have you traveled out of the country in the last month?" (e.g., travel history, exposures)       No  Protocols used: Cough - Acute Productive-A-AH

## 2023-08-09 NOTE — Telephone Encounter (Signed)
 Called patient and scheduled him for a virtual w/ Dr.Knapp at 1:30

## 2023-09-01 ENCOUNTER — Ambulatory Visit: Admitting: Podiatry

## 2023-09-16 ENCOUNTER — Ambulatory Visit (INDEPENDENT_AMBULATORY_CARE_PROVIDER_SITE_OTHER)

## 2023-09-16 ENCOUNTER — Ambulatory Visit: Admitting: Podiatry

## 2023-09-16 ENCOUNTER — Encounter: Payer: Self-pay | Admitting: Podiatry

## 2023-09-16 VITALS — Ht 68.0 in | Wt 165.0 lb

## 2023-09-16 DIAGNOSIS — G8918 Other acute postprocedural pain: Secondary | ICD-10-CM

## 2023-09-16 DIAGNOSIS — M19072 Primary osteoarthritis, left ankle and foot: Secondary | ICD-10-CM | POA: Diagnosis not present

## 2023-09-16 NOTE — Progress Notes (Signed)
 Subjective:   Patient ID: Wayne Brandt, male   DOB: 73 y.o.   MRN: [id number]   HPI Patient presents stating that he had surgery the beginning of April by orthopedic doctor for removal of screws in his ankle and is very concerned because his foot is hurting more and more swollen and he wants me to evaluate   ROS      Objective:  Physical Exam  Neurovascular status intact negative Celine Collard' sign was noted with wound edges coapted well from previous surgery.  X-rays were reviewed today no motion there is quite a bit of edema around the ankle bilateral warmth but no breakdown of tissue     Assessment:  Appears to be an inflammatory process in the ankle left and I cannot rule out that there is not something arthritic or possibility for low-grade pain syndrome but at this point it is much too early to make any kind of determination of that kind of condition     Plan:  H&P reviewed x-ray and I have recommended he continue along his present course and utilize compression ice as needed along with elevation and oral anti-inflammatories Aleve.  If symptoms persist he is to see his physician for reevaluation in the next month to 6 weeks as needed  X-rays indicate screws of improperly removed from the left ankle quite a bit of arthritis within the ankle with probability for some partial nonunion but overall I did not note any significant acute pathology

## 2023-09-20 ENCOUNTER — Ambulatory Visit: Admitting: Podiatry

## 2023-09-22 ENCOUNTER — Ambulatory Visit: Admitting: Family Medicine

## 2023-09-22 ENCOUNTER — Ambulatory Visit: Payer: Self-pay

## 2023-09-22 VITALS — BP 130/70 | HR 53 | Wt 173.4 lb

## 2023-09-22 DIAGNOSIS — J0100 Acute maxillary sinusitis, unspecified: Secondary | ICD-10-CM

## 2023-09-22 DIAGNOSIS — S80811A Abrasion, right lower leg, initial encounter: Secondary | ICD-10-CM

## 2023-09-22 LAB — FECAL OCCULT BLOOD, GUAIAC: Fecal Occult Blood: NEGATIVE

## 2023-09-22 MED ORDER — AMOXICILLIN-POT CLAVULANATE 875-125 MG PO TABS: 1.0000 | ORAL_TABLET | Freq: Two times a day (BID) | ORAL | 0 refills | Status: DC

## 2023-09-22 NOTE — Telephone Encounter (Signed)
 Patient scheduled with JCL today at 4:30pm.

## 2023-09-22 NOTE — Telephone Encounter (Signed)
 FYI Only or Action Required?: FYI only for provider  Patient was last seen in primary care on 08/09/2023 by Roosvelt Colla, MD. Called Nurse Triage reporting Laceration. Symptoms began yesterday. Interventions attempted: Other: bandage applied to area. Symptoms are: unchanged.  Triage Disposition: See Physician Within 24 Hours  Patient/caregiver understands and will follow disposition?: No, refuses disposition  Copied from CRM [id number]. Topic: Clinical - Red Word Triage >> Sep 22, 2023  9:23 AM Marissa P wrote: Red Word that prompted transfer to Nurse Triage: Patient called in said that he injured himself yesterday afternoon. Scrapped his leg, under knee right leg on the side. Has to change bandage pretty frequently and would like this to be looked at please. Reason for Disposition  [1] Looks infected (spreading redness, pus) AND [2] no fever    No s/s of infection reported. Pt states he has been changing dressing about every 4 hours: education giving to patient to apply pressure dressing to slow/stop bleeding.  Answer Assessment - Initial Assessment Questions 1. APPEARANCE of INJURY: What does the injury look like?      Cut/laceration - opening and actively bleeding: oozing  2. SIZE: How large is the cut?      Laceration is about an inch long 3. BLEEDING: Is it bleeding now? If Yes, ask: Is it difficult to stop?      Not right now 4. LOCATION: Where is the injury located?      Right leg - outside of calf area 5. ONSET: How long ago did the injury occur?      yesterday 6. MECHANISM: Tell me how it happened.      unknown 7. TETANUS: When was the last tetanus booster?     N/a 8. PREGNANCY: Is there any chance you are pregnant? When was your last menstrual period?     N/a  Patient stated he has been changing the dressing to this area every 4 hours or so since this occurred. Nurse provided patient with care advice to slow/stop bleeding.  No appts available for today:  offered appointment for tomorrow: pt refused stated he was frustrated with the entire appt process, pt stated he would call back and hung up.  Protocols used: Cuts and Lacerations-A-AH

## 2023-09-22 NOTE — Progress Notes (Signed)
   Subjective:    Patient ID: Wayne Brandt, male    DOB: [birth year], 73 y.o.   MRN: [id number]  HPI He states that he started having difficulty with the ear and sore throat symptoms in April.  He was eventually seen by ENT and treated with a Z-Pak and prednisone  which did help especially with the cough.  He is scheduled to follow-up with ENT and potentially have a tube placed.  Apparently there was an MRI which did show some pathology.  He continues to have difficulty with nasal congestion, postnasal drainage purulent discharge but no fever or chills.  No upper tooth discomfort.  He also states that yesterday he injured the right lower leg and is here for evaluation of that.   Review of Systems     Objective:    Physical Exam Alert and in no distress. Tympanic membrane on the left is slightly dull, right is normal, canals are normal. Pharyngeal area is normal. Neck is supple without adenopathy or thyromegaly. Cardiac exam shows a regular sinus rhythm without murmurs or gallops. Lungs are clear to auscultation. Superficial abrasion noted to the right lateral mid calf area.  It is approximately 3 x 4 cm in size. His immunizations are up-to-date.       Assessment & Plan:  Abrasion of right lower extremity, initial encounter  Acute maxillary sinusitis, recurrence not specified Recommend cleaning the abrasion with soap and water and keeping the area covered until it is dry. I then discussed the treatment of the sinus infection.  I will give him a 2-week course of the antibiotic.  Towards the end of that he should already be scheduled to see ENT to follow-up on possible procedure on the left ear.

## 2023-09-29 ENCOUNTER — Encounter: Payer: Self-pay | Admitting: *Deleted

## 2023-10-08 ENCOUNTER — Other Ambulatory Visit: Payer: Self-pay

## 2023-10-08 ENCOUNTER — Other Ambulatory Visit: Payer: Self-pay | Admitting: Family Medicine

## 2023-10-08 MED ORDER — OMEPRAZOLE 40 MG PO CPDR: 40.0000 mg | DELAYED_RELEASE_CAPSULE | Freq: Every day | ORAL | 0 refills | Status: DC

## 2023-10-28 ENCOUNTER — Ambulatory Visit: Payer: Self-pay

## 2023-10-28 NOTE — Telephone Encounter (Signed)
 FYI Only or Action Required?: Action required by provider: request for appointment. - no appts available. Pt wants to be seen in the afternoon today or tomorrow  Patient was last seen in primary care on 09/22/2023 by Joyce Norleen BROCKS, MD.  Called Nurse Triage reporting Hypertension - dizziness. Bps have been normal and high.   Symptoms began several days ago.  Interventions attempted: Nothing.  Symptoms are: unchanged. - only 1 dizzy spell.  Triage Disposition: See Physician Within 24 Hours  Patient/caregiver understands and will follow disposition?: Yes                    Copied from CRM [id number]. Topic: Clinical - Red Word Triage >> Oct 28, 2023  8:54 AM Berwyn MATSU wrote: Red Word that prompted transfer to Nurse Triage:  dizzy and high blood pressure Reason for Disposition  Systolic BP >= 180 OR Diastolic >= 110  Answer Assessment - Initial Assessment Questions 1. BLOOD PRESSURE: What is your blood pressure? Did you take at least two measurements 5 minutes apart?     180/100 - 110 - taken night before last 2. ONSET: When did you take your blood pressure?     This morning 126/65 3. HOW: How did you take your blood pressure? (e.g., automatic home BP monitor, visiting nurse)     Home  4. HISTORY: Do you have a history of high blood pressure?     yes 5. MEDICINES: Are you taking any medicines for blood pressure? Have you missed any doses recently?     Yes - takes it every morning 6. OTHER SYMPTOMS: Do you have any symptoms? (e.g., blurred vision, chest pain, difficulty breathing, headache, weakness)     Dizziness - wobbly - 178/98 - Tuesday night  Protocols used: Blood Pressure - High-A-AH

## 2023-10-28 NOTE — Telephone Encounter (Signed)
 Appointment scheduled.

## 2023-10-28 NOTE — Telephone Encounter (Signed)
 No opening left for this afternoon but openings tomorrow morning

## 2023-10-29 ENCOUNTER — Ambulatory Visit: Admitting: Medical

## 2023-10-29 VITALS — Temp 97.6°F | Wt 169.8 lb

## 2023-10-29 DIAGNOSIS — I10 Essential (primary) hypertension: Secondary | ICD-10-CM

## 2023-10-29 DIAGNOSIS — Z9622 Myringotomy tube(s) status: Secondary | ICD-10-CM

## 2023-10-29 DIAGNOSIS — H9312 Tinnitus, left ear: Secondary | ICD-10-CM | POA: Diagnosis not present

## 2023-10-29 MED ORDER — MECLIZINE HCL 25 MG PO TABS: 25.0000 mg | ORAL_TABLET | Freq: Two times a day (BID) | ORAL | 0 refills | Status: DC

## 2023-10-29 NOTE — Progress Notes (Signed)
 Subjective:  Wayne Brandt is a 73 y.o. male who presents for Chief Complaint  Patient presents with   Acute Visit    Dizziness since Tuesday, but bp was high 170/85. But bp the last 2 days have been   yesterday bp was 116/60, and this morning 115/60.      Here for dizziness, BP changes.  3 days ago was having some dizziness.  Felt like tv was moving around.   Would happen intermittent if moving or standing up.  symptoms would last for seconds.   No chest pain, no palpations.  No numbness or tingling.  No headaches.  He has been seeing ear nose and throat for ongoing issues with inner ears.  He has a TM tube in place currently of the left ear.  His ENT doctor said he would have some drainage but he has not really noticed any drainage yet and cannot tell it is helping a whole lot.  Exercises at the gym daily  Water intake is probably not what it should be.  No other aggravating or relieving factors.    No other c/o.  Past Medical History:  Diagnosis Date   Actinic keratosis    Dr. Tricia   Arthritis    wrists, L thumb   BPH (benign prostatic hyperplasia)    ED (erectile dysfunction)    Family history of premature CAD    GERD (gastroesophageal reflux disease)    HH (hiatus hernia)    History of basal cell carcinoma excision    2013-- chest area   History of esophagogastroduodenoscopy (EGD) 12/06/2017   Dr.Medoff-distal esophageal stricture,hiatal hernia,gastritis and duodenitis.   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     Cholecalciferol (VITAMIN D ) 50 MCG (2000  UT) CAPS Take 1 capsule by mouth daily.     Coenzyme Q10 (COQ10) 100 MG CAPS Take 1 capsule by mouth daily.     fish oil-omega-3 fatty acids 1000 MG capsule Take 1 g by mouth daily.     losartan  (COZAAR ) 100 MG tablet TAKE 1 TABLET(100 MG) BY MOUTH DAILY 90 tablet 3   omeprazole  (PRILOSEC) 40 MG capsule Take 1 capsule (40 mg total) by mouth daily. 90 capsule 0   Pyridoxine HCl (VITAMIN B-6) 500 MG tablet Take 500 mg by mouth daily.     rosuvastatin  (CRESTOR ) 5 MG tablet Take 1 tablet (5 mg total) by mouth daily. 90 tablet 3   tadalafil (CIALIS) 5 MG tablet Take 5 mg by mouth daily as needed for erectile dysfunction.     vitamin B-12 (CYANOCOBALAMIN) 1000 MCG tablet Take 1,000 mcg by mouth daily.     vitamin C (ASCORBIC ACID) 500 MG tablet Take 500 mg by mouth daily.     albuterol  (VENTOLIN  HFA) 108 (90 Base) MCG/ACT inhaler Inhale 2 puffs into the lungs every 6 (six) hours as needed for wheezing. Use 30 mins prior to exercise (Patient not taking: Reported on 10/29/2023) 18 g 0   fluorouracil (EFUDEX) 5 %  cream  (Patient not taking: Reported on 09/22/2023)     fluticasone (FLONASE) 50 MCG/ACT nasal spray Place 2 sprays into both nostrils daily. (Patient not taking: Reported on 10/29/2023)     GEMTESA 75 MG TABS Take 1 tablet by mouth daily. (Patient not taking: Reported on 10/29/2023)     guaiFENesin (MUCINEX) 600 MG 12 hr tablet Take 600 mg by mouth 2 (two) times daily.     No current facility-administered medications on file prior to visit.     The following portions of the patient's history were reviewed and updated as appropriate: allergies, current medications, past family history, past medical history, past social history, past surgical history and problem list.  ROS Otherwise as in subjective above    Objective: Temp 97.6 F (36.4 C)   Wt 169 lb 12.8 oz (77 kg)   BMI 25.82 kg/m   Wt Readings from Last 3 Encounters:  10/29/23 169 lb 12.8 oz (77 kg)  09/22/23 173 lb 6.4 oz (78.7  kg)  09/16/23 165 lb (74.8 kg)   BP Readings from Last 3 Encounters:  09/22/23 130/70  07/15/23 (!) 163/63  06/11/23 (!) 148/80   I personally checked the blood pressure on the left arm, 136/76   General appearance: alert, no distress, well developed, well nourished HEENT: normocephalic, sclerae anicteric, conjunctiva pink and moist, TM tube in place in the left ear, right TM normal.  nares patent, no discharge or erythema, pharynx normal Oral cavity: MMM, no lesions Neck: supple, no lymphadenopathy, no thyromegaly, no masses Heart: RRR, normal S1, S2, no murmurs Lungs: CTA bilaterally, no wheezes, rhonchi, or rales Pulses: 2+ radial pulses, 2+ pedal pulses, normal cap refill Ext: no edema   Assessment: Encounter Diagnoses  Name Primary?   Essential hypertension Yes   Subjective tinnitus of left ear    S/P tympanic tube insertion      Plan: Your symptoms suggest vertigo, inner ear issue.  You also have the tube in your left ear   Recommendations: Begin Meclizine oral tablet twice daily for the next 5-7 days for vertigo dizzy symptoms.  Caution as this can cause some drowsiness Drink more water, such as 80-100 ounces daily Begin Flonase nasal spray that you have used prior.  Use this daily for the next 2 weeks If worse or not improving by Monday then call back Recheck with ear doctor/ENT next week as planned  Hypertension-continue your current blood pressure medicine as usual.  Blood pressures today here were fine  Shareef was seen today for acute visit.  Diagnoses and all orders for this visit:  Essential hypertension  Subjective tinnitus of left ear  S/P tympanic tube insertion  Other orders -     meclizine (ANTIVERT) 25 MG tablet; Take 1 tablet (25 mg total) by mouth 2 (two) times daily.    Follow up: with ENT next week as planned

## 2023-10-29 NOTE — Patient Instructions (Addendum)
 Your symptoms suggest vertigo, inner ear issue.  You also have the tube in your left ear   Recommendations: Begin Meclizine oral tablet twice daily for the next 5-7 days for vertigo dizzy symptoms.  Caution as this can cause some drowsiness Drink more water, such as 80-100 ounces daily Begin Flonase nasal spray that you have used prior.  Use this daily for the next 2 weeks If worse or not improving by Monday then call back Recheck with ear doctor/ENT next week as planned  Vertigo Vertigo is the feeling that you or the things around you are moving or spinning when they're not. It's different than feeling dizzy. It can also cause: Loss of balance. Trouble standing or walking. Nausea and vomiting. This feeling can come and go at any time. It can last from a few seconds to minutes or even hours. It may go away on its own or be treated with medicine. What are the types of vertigo? There are two types of vertigo: Peripheral vertigo happens when parts of your inner ear don't work like they should. This is the more common type. Central vertigo happens when your brain and spinal cord don't work like they should. Your health care provider will do tests to find out what kind of vertigo you have. This will help them decide on the right treatment for you. Follow these instructions at home: Eating and drinking Drink enough fluid to keep your pee (urine) pale yellow. Do not drink alcohol. Activity When you get up in the morning, first sit up on the side of the bed. When you feel okay, stand slowly while holding onto something. Move slowly. Avoid sudden body or head movements. Avoid certain positions, as told by your provider. Use a cane if you have trouble standing or walking. Sit down right away if you feel unsteady. Place items in your home so they're easy for you to reach without bending or leaning over. Return to normal activities when you're told. Ask what things are safe for you to do. General  instructions Take your medicines only as told by your provider. Contact a health care provider if: Your medicines don't help or make your vertigo worse. You get new symptoms. You have a fever. You have nausea or vomiting. Your family or friends spot any changes in how you're acting. A part of your body goes numb. You feel tingling and prickling in a part of your body. You get very bad headaches. Get help right away if: You're always dizzy or you faint. You have a stiff neck. You have trouble moving or speaking. Your hands, arms, or legs feel weak. Your hearing or eyesight changes. These symptoms may be an emergency. Call 911 right away. Do not wait to see if the symptoms will go away. Do not drive yourself to the hospital. This information is not intended to replace advice given to you by your health care provider. Make sure you discuss any questions you have with your health care provider. Document Revised: 12/31/2022 Document Reviewed: 07/03/2022 Elsevier Patient Education  2024 [employer].

## 2023-11-08 ENCOUNTER — Ambulatory Visit: Admitting: Podiatry

## 2023-11-08 ENCOUNTER — Encounter: Payer: Self-pay | Admitting: Podiatry

## 2023-11-08 DIAGNOSIS — M19072 Primary osteoarthritis, left ankle and foot: Secondary | ICD-10-CM | POA: Diagnosis not present

## 2023-11-08 DIAGNOSIS — M778 Other enthesopathies, not elsewhere classified: Secondary | ICD-10-CM

## 2023-11-08 MED ORDER — TRIAMCINOLONE ACETONIDE 10 MG/ML IJ SUSP
10.0000 mg | Freq: Once | INTRAMUSCULAR | Status: AC
  Administered 2023-11-08: 10 mg via INTRA_ARTICULAR

## 2023-11-09 NOTE — Progress Notes (Signed)
 Subjective:   Patient ID: Wayne Brandt, male   DOB: 73 y.o.   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  I did do sterile prep and injected the fifth MPJ 3 mg dexamethasone  Kenalog  5 mg Xylocaine  to try to reduce the inflammatory process press

## 2024-01-07 ENCOUNTER — Other Ambulatory Visit: Payer: Self-pay | Admitting: Family Medicine

## 2024-01-13 ENCOUNTER — Other Ambulatory Visit: Payer: Self-pay | Admitting: Family Medicine

## 2024-01-13 DIAGNOSIS — I700 Atherosclerosis of aorta: Secondary | ICD-10-CM

## 2024-01-19 ENCOUNTER — Encounter: Payer: Self-pay | Admitting: Family Medicine

## 2024-01-19 ENCOUNTER — Other Ambulatory Visit: Payer: Self-pay | Admitting: Family Medicine

## 2024-01-19 DIAGNOSIS — I700 Atherosclerosis of aorta: Secondary | ICD-10-CM

## 2024-01-20 ENCOUNTER — Other Ambulatory Visit: Payer: Medicare Other

## 2024-01-20 DIAGNOSIS — I10 Essential (primary) hypertension: Secondary | ICD-10-CM

## 2024-01-20 DIAGNOSIS — Z5181 Encounter for therapeutic drug level monitoring: Secondary | ICD-10-CM

## 2024-01-20 DIAGNOSIS — Z125 Encounter for screening for malignant neoplasm of prostate: Secondary | ICD-10-CM

## 2024-01-20 DIAGNOSIS — E871 Hypo-osmolality and hyponatremia: Secondary | ICD-10-CM

## 2024-01-20 DIAGNOSIS — D649 Anemia, unspecified: Secondary | ICD-10-CM

## 2024-01-21 LAB — CBC WITH DIFFERENTIAL/PLATELET
Basophils Absolute: 0 x10E3/uL (ref 0.0–0.2)
Basos: 1 %
EOS (ABSOLUTE): 0.2 x10E3/uL (ref 0.0–0.4)
Eos: 5 %
Hematocrit: 35.8 % — ABNORMAL LOW (ref 37.5–51.0)
Hemoglobin: 11.9 g/dL — ABNORMAL LOW (ref 13.0–17.7)
Immature Grans (Abs): 0 x10E3/uL (ref 0.0–0.1)
Immature Granulocytes: 0 %
Lymphocytes Absolute: 1.1 x10E3/uL (ref 0.7–3.1)
Lymphs: 23 %
MCH: 32.1 pg (ref 26.6–33.0)
MCHC: 33.2 g/dL (ref 31.5–35.7)
MCV: 97 fL (ref 79–97)
Monocytes Absolute: 0.6 x10E3/uL (ref 0.1–0.9)
Monocytes: 13 %
Neutrophils Absolute: 2.8 x10E3/uL (ref 1.4–7.0)
Neutrophils: 58 %
Platelets: 212 x10E3/uL (ref 150–450)
RBC: 3.71 x10E6/uL — ABNORMAL LOW (ref 4.14–5.80)
RDW: 13.1 % (ref 11.6–15.4)
WBC: 4.8 x10E3/uL (ref 3.4–10.8)

## 2024-01-21 LAB — LIPID PANEL
Chol/HDL Ratio: 1.9 ratio (ref 0.0–5.0)
Cholesterol, Total: 153 mg/dL (ref 100–199)
HDL: 79 mg/dL (ref >39)
LDL Chol Calc (NIH): 65 mg/dL (ref 0–99)
Triglycerides: 40 mg/dL (ref 0–149)
VLDL Cholesterol Cal: 9 mg/dL (ref 5–40)

## 2024-01-21 LAB — COMPREHENSIVE METABOLIC PANEL WITH GFR
ALT: 18 IU/L (ref 0–44)
AST: 30 IU/L (ref 0–40)
Albumin: 4.3 g/dL (ref 3.8–4.8)
Alkaline Phosphatase: 103 IU/L (ref 47–123)
BUN/Creatinine Ratio: 14 (ref 10–24)
BUN: 12 mg/dL (ref 8–27)
Bilirubin Total: 0.6 mg/dL (ref 0.0–1.2)
CO2: 24 mmol/L (ref 20–29)
Calcium: 9.1 mg/dL (ref 8.6–10.2)
Chloride: 90 mmol/L — ABNORMAL LOW (ref 96–106)
Creatinine, Ser: 0.83 mg/dL (ref 0.76–1.27)
Globulin, Total: 2.5 g/dL (ref 1.5–4.5)
Glucose: 89 mg/dL (ref 70–99)
Potassium: 4.7 mmol/L (ref 3.5–5.2)
Sodium: 128 mmol/L — ABNORMAL LOW (ref 134–144)
Total Protein: 6.8 g/dL (ref 6.0–8.5)
eGFR: 92 mL/min/1.73 (ref >59)

## 2024-01-21 LAB — PSA: Prostate Specific Ag, Serum: 0.3 ng/mL (ref 0.0–4.0)

## 2024-01-23 NOTE — Patient Instructions (Incomplete)
  HEALTH MAINTENANCE RECOMMENDATIONS:  It is recommended that you get at least 30 minutes of aerobic exercise at least 5 days/week (for weight loss, you may need as much as 60-90 minutes). This can be any activity that gets your heart rate up. This can be divided in 10-15 minute intervals if needed, but try and build up your endurance at least once a week.  Weight bearing exercise is also recommended twice weekly.  Eat a healthy diet with lots of vegetables, fruits and fiber.  Colorful foods have a lot of vitamins (ie green vegetables, tomatoes, red peppers, etc).  Limit sweet tea, regular sodas and alcoholic beverages, all of which has a lot of calories and sugar.  Up to 2 alcoholic drinks daily may be beneficial for men (unless trying to lose weight, watch sugars).  Drink a lot of water.  Sunscreen of at least SPF 30 should be used on all sun-exposed parts of the skin when outside between the hours of 10 am and 4 pm (not just when at beach or pool, but even with exercise, golf, tennis, and yard work!)  Use a sunscreen that says broad spectrum so it covers both UVA and UVB rays, and make sure to reapply every 1-2 hours.  Remember to change the batteries in your smoke detectors when changing your clock times in the spring and fall.  Carbon monoxide detectors are recommended for your home.  Use your seat belt every time you are in a car, and please drive safely and not be distracted with cell phones and texting while driving.    Wayne Brandt , Thank you for taking time to come for your Medicare Wellness Visit. I appreciate your ongoing commitment to your health goals. Please review the following plan we discussed and let me know if I can assist you in the future.   This is a list of the screening recommended for you and due dates:  Health Maintenance  Topic Date Due   Zoster (Shingles) Vaccine (2 of 2) 07/22/2023   Flu Shot  11/12/2023   COVID-19 Vaccine (4 - 2025-26 season) 12/13/2023    Medicare Annual Wellness Visit  01/11/2024   Colon Cancer Screening  02/16/2024   DTaP/Tdap/Td vaccine (6 - Td or Tdap) 07/22/2027   Pneumococcal Vaccine for age over 44  Completed   Hepatitis C Screening  Completed   Meningitis B Vaccine  Aged Out   Please bring us  copies of your Living Will and Healthcare Power of Attorney once completed and notarized, so that it can be scanned into your medical chart.  RSV vaccine is recommended. You need to get this from the pharmacy.  Prevnar-20 vaccine is recommended--you only need this once, no further boosters. You can get this from our office or from the pharmacy (alternative is Capvaxive-21. You only need one or the other, just one time.  We do not carry the Capvaxive in our office).  You are due for colonoscopy next month. Please contact [facility] to schedule this if you don't hear from them. I'm not sure whether or not they might want to do an upper endoscopy at the same time.    Remember that Gemtesa caused you constipation.  If you plan to take that more often, please consider taking a stool softener daily (colace), and even consider Miralax  1/2 capful when taking it if the colace isn't effective.

## 2024-01-23 NOTE — Progress Notes (Unsigned)
 No chief complaint on file.   Wayne Brandt is a 73 y.o. male who presents for annual physical exam Medicare wellness visit and follow-up on chronic medical conditions.   See below for labs done prior to visit.  Review of health issues by other providers since last seen here-- He had L myringotomy tube placed 10/05/23 Dr. Carlie. Dr. Carlie referred him to [facility] Dr. Gilmer. He has L mixed hearing loss and ETD. Had CT temporal bone and MRI IAC. Possible tegmen dehiscence. Plans L tympanomastoidectomy, poss sculloplasty or cartilage graft, and tegmen defect repair.  S/p screw/hardware removal w/Dr. Kit at L ankle 07/15/23, with pain worse afterwards. He saw [facility] Dr. Glendia for evaluation of L ankle pain.  Reviewed poss surgical options (L ankle and subtalar fusions) vs bracing/orthotics/PT, NSAID.  H/o intermittent problems with neck pain. PT (from [facility]) has helped in the past. He does home stretches and uses TENS unit prn. Massages also help.  Hypertension:  He is compliant with Losartan  100mg  daily, and denies side effects.  BPs are running mostly 120-130's/70.   BP Readings from Last 3 Encounters:  09/22/23 130/70  07/15/23 (!) 163/63  06/11/23 (!) 148/80    He denies headaches, dizziness, chest pain, shortness of breath, swelling (just chronic in L ankle)  Last echo was in 06/2023: IMPRESSIONS   1. Left ventricular ejection fraction, by estimation, is 60 to 65%. The  left ventricle has normal function. The left ventricle has no regional  wall motion abnormalities. Left ventricular diastolic parameters are  indeterminate.   2. Right ventricular systolic function is normal. The right ventricular  size is normal. There is normal pulmonary artery systolic pressure.   3. Left atrial size was mild to moderately dilated.   4. Right atrial size was mildly dilated.   5. The mitral valve is normal in structure. Trivial mitral valve  regurgitation. No evidence of mitral  stenosis.   6. The aortic valve has an indeterminant number of cusps. There is  moderate calcification of the aortic valve. There is moderate thickening  of the aortic valve. Aortic valve regurgitation is trivial. Aortic valve  sclerosis/calcification is present,  without any evidence of aortic stenosis.   7. The inferior vena cava is normal in size with greater than 50%  respiratory variability, suggesting right atrial pressure of 3 mmHg.   8. Ascending aorta measurements are within normal limits for age when  indexed to body surface area.     H/o RAD/asthma. No recent issues.  He currently denies any wheezing.  ***using albuterol  prior to exercise? Any DOE?   Aortic atherosclerosis and coronary calcifications. He was started on Crestor  5mg  daily after coronary scan in 2021. He reports compliance and denies side effects. Lipids have been controlled on this, see below.   H/o iron deficiency anemia. He takes PPI once daily.  Hemoglobin has been stable. He denies any bleeding, melena, hematochezia.   Component Ref Range & Units (hover) 3 d ago (01/20/24) 8 mo ago (05/26/23) 1 yr ago (01/07/23) 2 yr ago (12/11/21) 2 yr ago (11/27/21) 2 yr ago (11/21/21) 2 yr ago (11/20/21)  WBC 4.8 7.8 4.6 5.0 7.6 5.8 R 7.6 R  RBC 3.71 Low  3.61 Low  3.68 Low  3.89 Low  3.49 Low  3.30 Low  R 3.37 Low  R  Hemoglobin 11.9 Low  11.4 Low  11.8 Low  12.5 Low  11.0 Low  10.6 Low  R 10.9 Low  R  Hematocrit 35.8 Low  33.4 Low  35.0 Low  36.5 Low  31.4 Low  30.2 Low  R 30.6 Low  R  MCV 97 93 95 94 90 91.5 R 90.8 R  MCH 32.1 31.6 32.1 32.1 31.5 32.1 R 32.3 R  MCHC 33.2 34.1 33.7 34.2 35.0 35.1 R 35.6 R  RDW 13.1 13.1 12.3 13.2 12.7 13.3 R 13.3 R  Platelets 212 214 197 176 228 183 R 204 R    He has a reported h/o eosinophilic esophagitis, previously under the care of Dr. Luis.  Has had multiple EGD and dilatations in the past. He had food impaction, treated by Dr. Avram with EGD in 10/2022. Pathology from  biopsies showed reflux esophagitis, NOT eosinophilic esophagitis.  EGD w/dilation scheduled for 01/06/23 --***HE DID NOT HAVE THIS ***  Hiatal hernia was noted on CTA done 12/2020.   Vitamin D  deficiency (mild):  Level was low at 27 lin 2016; last check was 47.3 in 11/2021 when he had been taking 2000 IU daily, but had run out a few weeks prior to the test. He is currently taking 2000 IU daily.   BPH: s/p TURP 05/2016 by Dr. Ottelin.  He had been prescribed Gemtesa, used it periodically (affected his bowels, and also cost). He was prescribed generic myrbetriq  at his last visit in 11/2023.  ***UPDATE  He gets up 3-4x/night, contributed by drinking tea during the day, beer in the evenings. He is able to get back to sleep.  He doesn't have much frequency during the day.  Peyronie's disease and erectile issues.  Asymptomatic with respect to Peyronie's. No treatment was recommended.  He sees urologist, Dr. Lovie, last visit in 11/2023.  He is now on Trimix injections after oral medications weren't effective (daily cialis with sildenafil  prn).   Immunization History  Administered Date(s) Administered   Fluad Quad(high Dose 65+) 02/20/2019, 12/21/2019, 03/19/2022   Fluad Trivalent(High Dose 65+) 01/11/2023   INFLUENZA, HIGH DOSE SEASONAL PF 02/06/2016, 06/21/2017, 02/16/2018   Influenza Whole 01/05/2008   Influenza,inj,Quad PF,6+ Mos 02/01/2013, 05/28/2015, 02/19/2021   Influenza,inj,quad, With Preservative 02/20/2019, 03/13/2020   PFIZER(Purple Top)SARS-COV-2 Vaccination 05/20/2019, 06/13/2019, 02/29/2020   Pneumococcal Conjugate-13 07/04/2015   Pneumococcal Polysaccharide-23 06/01/2007, 07/09/2016   Td 06/20/1997, 07/21/2017   Td (Adult), 2 Lf Tetanus Toxid, Preservative Free 06/20/1997, 07/21/2017   Tdap 01/05/2008   Zoster Recombinant(Shingrix) 05/27/2023   Zoster, Live 02/25/2012   Last colonoscopy: 02/2014; pt states told 10 years Last PSA: Lab Results  Component Value Date   PSA1 0.3  01/20/2024   PSA1 0.3 01/07/2023   PSA1 0.3 12/11/2021   PSA 0.85 07/04/2015   PSA 0.73 06/28/2014   PSA 1.43 06/26/2013   Dentist: twice yearly   Ophtho: yearly Exercise:   daily (elliptical, bike, rowing machine) for 20-30 minutes every day, plus some walking/sprinting around the track; also playing tennis. Uses weights every other day (3x/week at least).   Patient Care Team: Randol Dawes, MD as PCP - General (Family Medicine) ENT: Dr. Carlie (and his PA, Alm Camps), and Dr. Gilmer Urologist: Dr. Lovie Dentist: Dr. Debrah Dermatologist: Dr. Tricia Cardiologist: Dr. Lavona GI: Dr. Luis (retired); Dr. Avram Ortho: Dr. Melodi, Dr. Addie (for shoulder), Dr. Sissy, Dr. Melita (R shoulder), Dr. Kit (ankle), Dr. Glendia (ankle)   Depression Screening: Flowsheet Row Office Visit from 01/11/2023 in [facility]  PHQ-2 Total Score 0     Falls screen:     01/11/2023    8:29 AM 08/06/2022  2:23 PM 12/17/2021    8:35 AM 11/03/2021   10:50 AM 08/11/2021   10:00 AM  Fall Risk   Falls in the past year? 1 0 0 0 0  Number falls in past yr: 0 0 0 0 0  Comment late July fell while playing tennis      Injury with Fall? 0 0 0 0 0  Risk for fall due to : History of fall(s) No Fall Risks No Fall Risks No Fall Risks   Follow up Falls evaluation completed Falls evaluation completed Falls evaluation completed  Falls evaluation completed       Data saved with a previous flowsheet row definition     Functional Status Survey:         He doesn't have Living Will or Healthcare power of attorney. He has been given paperwork in the past.    PMH, PSH, SH and FH were reviewed and updated    ROS: The patient denies anorexia, fever, headaches, vision loss, hoarseness, chest pain, palpitations, dizziness, dyspnea on exertion, swelling, nausea, vomiting, diarrhea, abdominal pain, melena, hematochezia, hematuria, dysuria, weakness, tremor, suspicious skin lesions  (saw derm last week), depression, anxiety, abnormal bleeding or enlarged lymph nodes    Some constipation (f/b large/loose stools) related to [place]. Up 3-4x/night to void. Denies hesitancy, weakened stream. Has some dribbling at the end. Denies incontinence. Denies dysuria. No rectal bleeding since getting hemorrhoidal banding   Occasional tingling R hand from ulnar nerve issue (chronic)--not as strong as in the past, think taking B6 has helped a lot.   No dysphagia. Denies heartburn. Tinnitus is chronic.   Easy bruising on arms, no bleeding. L ankle pain   PHYSICAL EXAM:  There were no vitals taken for this visit.  Wt Readings from Last 3 Encounters:  10/29/23 169 lb 12.8 oz (77 kg)  09/22/23 173 lb 6.4 oz (78.7 kg)  09/16/23 165 lb (74.8 kg)    General Appearance:    Alert, cooperative, no distress, appears stated age.    Head:    Normocephalic, atraumatic  Eyes:    PERRL, conjunctiva/corneas clear, EOM's intact, fundi benign     Ears:    TM's and EACs normal bilaterally. Myringotomy tube on L  Nose:    No drainage, sinuses nontender  Throat:    Normal mucosa, no lesions  Neck:    Supple, no lymphadenopathy; thyroid: no enlargement/ tenderness/ nodules; no carotid bruit or JVD     Back:    Spine nontender, no curvature, ROM normal, no CVA tenderness     Lungs:    Clear to auscultation bilaterally without wheezes, rales or ronchi; respirations unlabored     Chest Wall:    No tenderness or deformity     Heart:    Regular rate and rhythm, S1 and S2 normal, no murmur, rub or gallop     Breast Exam:    No chest wall tenderness, masses or gynecomastia     Abdomen:    Soft, non-tender, nondistended, normoactive bowel sounds, no masses, no hepatosplenomegaly     Genitalia:    Deferred to urologist  Rectal:    Not performed  Extremities:    No clubbing, cyanosis or edema. +leg length discrepancy (left is shorter than right leg) atrophy of LLE WHSS R knee, swollen, warm, no  erythema *** WHSS and swelling/effusion at L ankle. Warm, no erythema. 1+ pitting edema at L ankle.  Pulses:    2+ and symmetric all extremities  Skin:    Skin color, texture, turgor normal, no rashes. Actinic damage throughout.   Purpura/bruising on hands/forearms (very large purpura on dorsum of R wrist/distal forearm)  Lymph nodes:    Cervical, supraclavicular, and inguinal nodes normal     Neurologic:    Normal strength, sensation and gait; reflexes 2+ and symmetric throughout                   Psych:   Normal mood, affect, hygiene and grooming   **PE tube on left UPDATE --edema L ankle?? *** UPDATE purpura/skin   Lab Results  Component Value Date   WBC 4.8 01/20/2024   HGB 11.9 (L) 01/20/2024   HCT 35.8 (L) 01/20/2024   MCV 97 01/20/2024   PLT 212 01/20/2024   Lab Results  Component Value Date   CHOL 153 01/20/2024   HDL 79 01/20/2024   LDLCALC 65 01/20/2024   TRIG 40 01/20/2024   CHOLHDL 1.9 01/20/2024   PSA 0.3  Fasting glucose 89    Chemistry      Component Value Date/Time   NA 128 (L) 01/20/2024 0908   K 4.7 01/20/2024 0908   CL 90 (L) 01/20/2024 0908   CO2 24 01/20/2024 0908   BUN 12 01/20/2024 0908   CREATININE 0.83 01/20/2024 0908   CREATININE 0.77 07/09/2016 0843      Component Value Date/Time   CALCIUM  9.1 01/20/2024 0908   ALKPHOS 103 01/20/2024 0908   AST 30 01/20/2024 0908   ALT 18 01/20/2024 0908   BILITOT 0.6 01/20/2024 0908       ASSESSMENT/PLAN:  Does he need new forms for living will/POA?  We don't have them in chart  Did he ever get 2nd shingrix? Or RSV vaccine? Decline COVID (if declines)  Flu, Prevnar-20 recommended (prev declined)  Should be on myrbetriq  from urologist, no longer gemtesa. ??  Colonoscopy due next month; ? EGD? Planning same time?? Has no appts with [facility] scheduled.    Recommended at least 30 minutes of aerobic activity at least 5 days/week, weight-bearing exercise at least 2x/wk; proper sunscreen  use reviewed; healthy diet and alcohol recommendations (less than or equal to 2 drinks/day) reviewed; regular seatbelt use; changing batteries in smoke detectors. Immunization recommendations discussed--continue high dose flu shots yearly, given today. *** Prevnar-20 ***.  COVID booster recommended, declined today. RSV vaccine also recommended, to get from the pharmacy.  Colonoscopy recommendations reviewed--UTD, due again 02/2024.     Most form reviewed/completed, Full Code, Full care Patient reminded to do healthcare power of attorney and living will, and get us  copies once notarized/completed.  F/u 1 year for CPE/AWV, sooner prn.   Medicare Attestation I have personally reviewed: The patient's medical and social history Their use of alcohol, tobacco or illicit drugs Their current medications and supplements The patient's functional ability including ADLs,fall risks, home safety risks, cognitive, and hearing and visual impairment Diet and physical activities Evidence for depression or mood disorders  The patient's weight, height, BMI have been recorded in the chart.  I have made referrals, counseling, and provided education to the patient based on review of the above and I have provided the patient with a written personalized care plan for preventive services.

## 2024-01-24 ENCOUNTER — Encounter: Payer: Self-pay | Admitting: Family Medicine

## 2024-01-24 ENCOUNTER — Ambulatory Visit (INDEPENDENT_AMBULATORY_CARE_PROVIDER_SITE_OTHER): Payer: Medicare Other | Admitting: Family Medicine

## 2024-01-24 ENCOUNTER — Telehealth: Payer: Self-pay

## 2024-01-24 VITALS — BP 130/60 | HR 64 | Ht 67.0 in | Wt 168.6 lb

## 2024-01-24 DIAGNOSIS — E871 Hypo-osmolality and hyponatremia: Secondary | ICD-10-CM

## 2024-01-24 DIAGNOSIS — D649 Anemia, unspecified: Secondary | ICD-10-CM

## 2024-01-24 DIAGNOSIS — Z0000 Encounter for general adult medical examination without abnormal findings: Secondary | ICD-10-CM

## 2024-01-24 DIAGNOSIS — N138 Other obstructive and reflux uropathy: Secondary | ICD-10-CM

## 2024-01-24 DIAGNOSIS — N401 Enlarged prostate with lower urinary tract symptoms: Secondary | ICD-10-CM

## 2024-01-24 DIAGNOSIS — G14 Postpolio syndrome: Secondary | ICD-10-CM

## 2024-01-24 DIAGNOSIS — K2970 Gastritis, unspecified, without bleeding: Secondary | ICD-10-CM

## 2024-01-24 DIAGNOSIS — E559 Vitamin D deficiency, unspecified: Secondary | ICD-10-CM

## 2024-01-24 DIAGNOSIS — H903 Sensorineural hearing loss, bilateral: Secondary | ICD-10-CM

## 2024-01-24 DIAGNOSIS — I10 Essential (primary) hypertension: Secondary | ICD-10-CM | POA: Diagnosis not present

## 2024-01-24 DIAGNOSIS — K2100 Gastro-esophageal reflux disease with esophagitis, without bleeding: Secondary | ICD-10-CM

## 2024-01-24 DIAGNOSIS — I700 Atherosclerosis of aorta: Secondary | ICD-10-CM

## 2024-01-24 DIAGNOSIS — Z23 Encounter for immunization: Secondary | ICD-10-CM | POA: Diagnosis not present

## 2024-01-24 MED ORDER — LOSARTAN POTASSIUM 100 MG PO TABS: ORAL_TABLET | ORAL | 3 refills | Status: DC

## 2024-01-24 NOTE — Telephone Encounter (Signed)
 I'd prefer not to order these labs a year in advance. He may have labs done by other providers during that time. I'd rather him call a week or two before his physical to schedule his labs, and at that time I can review what needs to be done.  Please let him know this.  thanks

## 2024-01-24 NOTE — Telephone Encounter (Signed)
 Patient would like labs done a few days before his next physical. His CPE is scheduled for 02/15/2025. I let him know if you approved we would call him to schedule the lab.

## 2024-01-25 NOTE — Telephone Encounter (Signed)
I will let him know, thank you.

## 2024-02-15 ENCOUNTER — Other Ambulatory Visit: Payer: Self-pay | Admitting: Family Medicine

## 2024-02-15 DIAGNOSIS — I10 Essential (primary) hypertension: Secondary | ICD-10-CM

## 2024-02-24 ENCOUNTER — Encounter: Payer: Self-pay | Admitting: Podiatry

## 2024-02-24 ENCOUNTER — Ambulatory Visit: Admitting: Podiatry

## 2024-02-24 DIAGNOSIS — M19072 Primary osteoarthritis, left ankle and foot: Secondary | ICD-10-CM

## 2024-02-24 DIAGNOSIS — L84 Corns and callosities: Secondary | ICD-10-CM

## 2024-02-26 NOTE — Progress Notes (Signed)
 Subjective:   Patient ID: Wayne Brandt, male   DOB: 73 y.o.   MRN: [id number]   HPI Patient presents with severe pain in the left ankle and is just seen in new physician for this along with callus that forms left with multiple questions   ROS      Objective:  Physical Exam  Neurovascular status was found to be intact quite a bit of edema around the left ankle with the patient who has a lot of pain in the ankle and has had previous fusion which appears to have a nonunion component along with chronic callus formation     Assessment:  Very difficult condition with history of fusion of the left ankle that is not doing well after screws were removed and corn callus formation     Plan:  H&P reviewed he was given a offer of using nails to refuse the ankle bone but this would be a difficult surgery with at least 6 months to a year of recovery and patient is not sure and I tried to give him the pros and cons.  I did do courtesy debridement today and he could see one of our physicians that does this type of work if he so chooses and I made him aware that

## 2024-04-06 ENCOUNTER — Other Ambulatory Visit: Payer: Self-pay | Admitting: Family Medicine

## 2024-04-12 ENCOUNTER — Other Ambulatory Visit: Payer: Self-pay | Admitting: Family Medicine

## 2024-04-12 DIAGNOSIS — I700 Atherosclerosis of aorta: Secondary | ICD-10-CM

## 2024-04-29 ENCOUNTER — Encounter: Payer: Self-pay | Admitting: Internal Medicine

## 2025-02-15 ENCOUNTER — Encounter: Payer: Self-pay | Admitting: Family Medicine
